# Patient Record
Sex: Male | Born: 1935 | Race: White | Hispanic: No | Smoking: Former smoker
Health system: Southern US, Community
[De-identification: ages and names within clinical notes are randomized; demographics above are authoritative.]

## PROBLEM LIST (undated history)

## (undated) DIAGNOSIS — M109 Gout, unspecified: Secondary | ICD-10-CM

## (undated) DIAGNOSIS — J939 Pneumothorax, unspecified: Secondary | ICD-10-CM

## (undated) DIAGNOSIS — E119 Type 2 diabetes mellitus without complications: Secondary | ICD-10-CM

## (undated) DIAGNOSIS — N189 Chronic kidney disease, unspecified: Secondary | ICD-10-CM

## (undated) DIAGNOSIS — I739 Peripheral vascular disease, unspecified: Secondary | ICD-10-CM

## (undated) DIAGNOSIS — I2510 Atherosclerotic heart disease of native coronary artery without angina pectoris: Secondary | ICD-10-CM

## (undated) DIAGNOSIS — E785 Hyperlipidemia, unspecified: Secondary | ICD-10-CM

## (undated) DIAGNOSIS — I509 Heart failure, unspecified: Secondary | ICD-10-CM

## (undated) DIAGNOSIS — I10 Essential (primary) hypertension: Secondary | ICD-10-CM

## (undated) DIAGNOSIS — K5790 Diverticulosis of intestine, part unspecified, without perforation or abscess without bleeding: Secondary | ICD-10-CM

## (undated) DIAGNOSIS — I219 Acute myocardial infarction, unspecified: Secondary | ICD-10-CM

## (undated) DIAGNOSIS — I4891 Unspecified atrial fibrillation: Secondary | ICD-10-CM

## (undated) DIAGNOSIS — K219 Gastro-esophageal reflux disease without esophagitis: Secondary | ICD-10-CM

## (undated) DIAGNOSIS — M1990 Unspecified osteoarthritis, unspecified site: Secondary | ICD-10-CM

## (undated) HISTORY — PX: HERNIA REPAIR: SHX51

## (undated) HISTORY — PX: FEMUR SURGERY: SHX943

## (undated) HISTORY — PX: CHOLECYSTECTOMY: SHX55

## (undated) HISTORY — PX: JOINT REPLACEMENT: SHX530

## (undated) HISTORY — PX: REPLACEMENT TOTAL KNEE: SUR1224

## (undated) HISTORY — PX: LUNG SURGERY: SHX703

## (undated) HISTORY — PX: CORONARY ARTERY BYPASS GRAFT: SHX141

## (undated) HISTORY — PX: GALLBLADDER SURGERY: SHX652

## (undated) HISTORY — PX: CARDIAC SURGERY: SHX584

## (undated) HISTORY — PX: OTHER SURGICAL HISTORY: SHX169

## (undated) HISTORY — PX: CARDIAC CATHETERIZATION: SHX172

---
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
## 1999-03-01 DIAGNOSIS — I219 Acute myocardial infarction, unspecified: Secondary | ICD-10-CM

## 1999-03-01 HISTORY — DX: Acute myocardial infarction, unspecified: I21.9

## 1999-11-22 ENCOUNTER — Encounter: Payer: Self-pay | Admitting: Thoracic Surgery (Cardiothoracic Vascular Surgery)

## 1999-11-22 ENCOUNTER — Inpatient Hospital Stay (HOSPITAL_COMMUNITY)
Admission: EM | Admit: 1999-11-22 | Discharge: 1999-11-29 | Payer: Self-pay | Admitting: Thoracic Surgery (Cardiothoracic Vascular Surgery)

## 1999-11-23 ENCOUNTER — Encounter: Payer: Self-pay | Admitting: Thoracic Surgery (Cardiothoracic Vascular Surgery)

## 1999-11-24 ENCOUNTER — Encounter: Payer: Self-pay | Admitting: Thoracic Surgery (Cardiothoracic Vascular Surgery)

## 1999-11-25 ENCOUNTER — Encounter: Payer: Self-pay | Admitting: Thoracic Surgery (Cardiothoracic Vascular Surgery)

## 2005-01-25 ENCOUNTER — Other Ambulatory Visit: Payer: Self-pay

## 2005-01-25 ENCOUNTER — Emergency Department: Payer: Self-pay | Admitting: Emergency Medicine

## 2005-07-12 ENCOUNTER — Other Ambulatory Visit: Payer: Self-pay

## 2005-07-12 ENCOUNTER — Inpatient Hospital Stay: Payer: Self-pay | Admitting: Surgery

## 2006-01-06 ENCOUNTER — Ambulatory Visit: Payer: Self-pay | Admitting: Family Medicine

## 2006-12-01 ENCOUNTER — Ambulatory Visit: Payer: Self-pay | Admitting: Family Medicine

## 2007-05-01 ENCOUNTER — Ambulatory Visit: Payer: Self-pay | Admitting: Gastroenterology

## 2007-05-02 ENCOUNTER — Ambulatory Visit: Payer: Self-pay | Admitting: Gastroenterology

## 2007-07-21 ENCOUNTER — Emergency Department: Payer: Self-pay | Admitting: Emergency Medicine

## 2007-08-07 ENCOUNTER — Ambulatory Visit: Payer: Self-pay | Admitting: Urology

## 2007-08-07 ENCOUNTER — Other Ambulatory Visit: Payer: Self-pay

## 2007-08-09 ENCOUNTER — Ambulatory Visit: Payer: Self-pay | Admitting: Urology

## 2007-08-16 ENCOUNTER — Ambulatory Visit: Payer: Self-pay | Admitting: Urology

## 2007-09-04 ENCOUNTER — Ambulatory Visit: Payer: Self-pay | Admitting: Urology

## 2007-10-18 ENCOUNTER — Ambulatory Visit: Payer: Self-pay | Admitting: Urology

## 2007-11-21 ENCOUNTER — Ambulatory Visit: Payer: Self-pay | Admitting: Urology

## 2008-03-10 ENCOUNTER — Ambulatory Visit: Payer: Self-pay | Admitting: Urology

## 2008-11-09 ENCOUNTER — Ambulatory Visit: Payer: Self-pay | Admitting: Family Medicine

## 2009-01-09 ENCOUNTER — Ambulatory Visit: Payer: Self-pay | Admitting: Surgery

## 2009-01-13 ENCOUNTER — Ambulatory Visit: Payer: Self-pay | Admitting: Surgery

## 2009-12-24 ENCOUNTER — Ambulatory Visit: Payer: Self-pay | Admitting: Internal Medicine

## 2010-07-14 ENCOUNTER — Ambulatory Visit: Payer: Self-pay | Admitting: Family Medicine

## 2010-09-07 ENCOUNTER — Ambulatory Visit: Payer: Self-pay | Admitting: Internal Medicine

## 2010-09-07 ENCOUNTER — Inpatient Hospital Stay: Payer: Self-pay | Admitting: Internal Medicine

## 2011-07-31 ENCOUNTER — Ambulatory Visit: Payer: Self-pay | Admitting: Medical

## 2012-12-18 DIAGNOSIS — N184 Chronic kidney disease, stage 4 (severe): Secondary | ICD-10-CM | POA: Insufficient documentation

## 2012-12-18 DIAGNOSIS — Z951 Presence of aortocoronary bypass graft: Secondary | ICD-10-CM | POA: Insufficient documentation

## 2012-12-18 DIAGNOSIS — E876 Hypokalemia: Secondary | ICD-10-CM | POA: Insufficient documentation

## 2012-12-18 DIAGNOSIS — Z5181 Encounter for therapeutic drug level monitoring: Secondary | ICD-10-CM | POA: Insufficient documentation

## 2012-12-18 DIAGNOSIS — Z96659 Presence of unspecified artificial knee joint: Secondary | ICD-10-CM | POA: Insufficient documentation

## 2012-12-18 DIAGNOSIS — I2510 Atherosclerotic heart disease of native coronary artery without angina pectoris: Secondary | ICD-10-CM | POA: Insufficient documentation

## 2012-12-21 ENCOUNTER — Encounter: Payer: Self-pay | Admitting: Internal Medicine

## 2012-12-23 ENCOUNTER — Ambulatory Visit: Payer: Self-pay | Admitting: Internal Medicine

## 2012-12-24 LAB — BASIC METABOLIC PANEL
Anion Gap: 7 (ref 7–16)
BUN: 36 mg/dL — ABNORMAL HIGH (ref 7–18)
Calcium, Total: 8.4 mg/dL — ABNORMAL LOW (ref 8.5–10.1)
Chloride: 106 mmol/L (ref 98–107)
Co2: 30 mmol/L (ref 21–32)
Creatinine: 1.81 mg/dL — ABNORMAL HIGH (ref 0.60–1.30)
EGFR (African American): 39 — ABNORMAL LOW
EGFR (Non-African Amer.): 34 — ABNORMAL LOW
Glucose: 91 mg/dL (ref 65–99)
Osmolality: 293 (ref 275–301)
Potassium: 3.8 mmol/L (ref 3.5–5.1)
Sodium: 143 mmol/L (ref 136–145)

## 2012-12-24 LAB — CBC WITH DIFFERENTIAL/PLATELET
Basophil #: 0 10*3/uL (ref 0.0–0.1)
Basophil %: 0.5 %
Eosinophil #: 0.5 10*3/uL (ref 0.0–0.7)
Eosinophil %: 5.7 %
HCT: 31.5 % — ABNORMAL LOW (ref 40.0–52.0)
HGB: 10.6 g/dL — ABNORMAL LOW (ref 13.0–18.0)
Lymphocyte #: 1.4 10*3/uL (ref 1.0–3.6)
Lymphocyte %: 18 %
MCH: 31.1 pg (ref 26.0–34.0)
MCHC: 33.7 g/dL (ref 32.0–36.0)
MCV: 92 fL (ref 80–100)
Monocyte #: 0.8 x10 3/mm (ref 0.2–1.0)
Monocyte %: 9.7 %
Neutrophil #: 5.3 10*3/uL (ref 1.4–6.5)
Neutrophil %: 66.1 %
Platelet: 240 10*3/uL (ref 150–440)
RBC: 3.42 10*6/uL — ABNORMAL LOW (ref 4.40–5.90)
RDW: 14.4 % (ref 11.5–14.5)
WBC: 8.1 10*3/uL (ref 3.8–10.6)

## 2012-12-25 ENCOUNTER — Ambulatory Visit: Payer: Self-pay | Admitting: Internal Medicine

## 2012-12-25 LAB — PROTIME-INR
INR: 1.7
Prothrombin Time: 19.5 secs — ABNORMAL HIGH (ref 11.5–14.7)

## 2012-12-27 LAB — PROTIME-INR
INR: 2.3
Prothrombin Time: 25.1 secs — ABNORMAL HIGH (ref 11.5–14.7)

## 2012-12-29 ENCOUNTER — Encounter: Payer: Self-pay | Admitting: Internal Medicine

## 2013-01-01 LAB — PROTIME-INR
INR: 3.9
Prothrombin Time: 36.6 secs — ABNORMAL HIGH (ref 11.5–14.7)

## 2013-01-08 LAB — PROTIME-INR
INR: 6.2
Prothrombin Time: 52 secs — ABNORMAL HIGH (ref 11.5–14.7)

## 2013-05-21 DIAGNOSIS — E782 Mixed hyperlipidemia: Secondary | ICD-10-CM | POA: Insufficient documentation

## 2013-07-19 DIAGNOSIS — I739 Peripheral vascular disease, unspecified: Secondary | ICD-10-CM | POA: Insufficient documentation

## 2013-07-24 DIAGNOSIS — R609 Edema, unspecified: Secondary | ICD-10-CM | POA: Insufficient documentation

## 2013-07-24 DIAGNOSIS — K219 Gastro-esophageal reflux disease without esophagitis: Secondary | ICD-10-CM | POA: Insufficient documentation

## 2013-07-24 DIAGNOSIS — M109 Gout, unspecified: Secondary | ICD-10-CM | POA: Insufficient documentation

## 2013-07-24 DIAGNOSIS — E1129 Type 2 diabetes mellitus with other diabetic kidney complication: Secondary | ICD-10-CM | POA: Insufficient documentation

## 2013-07-24 DIAGNOSIS — E119 Type 2 diabetes mellitus without complications: Secondary | ICD-10-CM | POA: Insufficient documentation

## 2013-12-12 ENCOUNTER — Inpatient Hospital Stay: Payer: Self-pay | Admitting: Internal Medicine

## 2013-12-12 LAB — COMPREHENSIVE METABOLIC PANEL
Albumin: 3.2 g/dL — ABNORMAL LOW (ref 3.4–5.0)
Alkaline Phosphatase: 69 U/L
Anion Gap: 8 (ref 7–16)
BUN: 36 mg/dL — ABNORMAL HIGH (ref 7–18)
Bilirubin,Total: 0.7 mg/dL (ref 0.2–1.0)
Calcium, Total: 8.3 mg/dL — ABNORMAL LOW (ref 8.5–10.1)
Chloride: 104 mmol/L (ref 98–107)
Co2: 30 mmol/L (ref 21–32)
Creatinine: 2.13 mg/dL — ABNORMAL HIGH (ref 0.60–1.30)
EGFR (African American): 38 — ABNORMAL LOW
EGFR (Non-African Amer.): 32 — ABNORMAL LOW
Glucose: 95 mg/dL (ref 65–99)
Osmolality: 291 (ref 275–301)
Potassium: 3.6 mmol/L (ref 3.5–5.1)
SGOT(AST): 22 U/L (ref 15–37)
SGPT (ALT): 28 U/L
Sodium: 142 mmol/L (ref 136–145)
Total Protein: 5.8 g/dL — ABNORMAL LOW (ref 6.4–8.2)

## 2013-12-12 LAB — URINALYSIS, COMPLETE
Bacteria: NONE SEEN
Bilirubin,UR: NEGATIVE
Blood: NEGATIVE
Glucose,UR: NEGATIVE mg/dL (ref 0–75)
Hyaline Cast: 5
Ketone: NEGATIVE
Nitrite: NEGATIVE
Ph: 5 (ref 4.5–8.0)
Protein: NEGATIVE
RBC,UR: 2 /HPF (ref 0–5)
Specific Gravity: 1.006 (ref 1.003–1.030)
Squamous Epithelial: NONE SEEN
WBC UR: 8 /HPF (ref 0–5)

## 2013-12-12 LAB — CBC
HCT: 35.3 % — ABNORMAL LOW (ref 40.0–52.0)
HGB: 11.3 g/dL — ABNORMAL LOW (ref 13.0–18.0)
MCH: 30.2 pg (ref 26.0–34.0)
MCHC: 32.1 g/dL (ref 32.0–36.0)
MCV: 94 fL (ref 80–100)
Platelet: 186 10*3/uL (ref 150–440)
RBC: 3.76 10*6/uL — ABNORMAL LOW (ref 4.40–5.90)
RDW: 15.1 % — ABNORMAL HIGH (ref 11.5–14.5)
WBC: 8.6 10*3/uL (ref 3.8–10.6)

## 2013-12-12 LAB — CK TOTAL AND CKMB (NOT AT ARMC)
CK, Total: 161 U/L
CK-MB: 1.7 ng/mL (ref 0.5–3.6)

## 2013-12-12 LAB — PROTIME-INR
INR: 2
Prothrombin Time: 22.5 secs — ABNORMAL HIGH (ref 11.5–14.7)

## 2013-12-12 LAB — APTT: Activated PTT: 28.7 secs (ref 23.6–35.9)

## 2013-12-12 LAB — TROPONIN I: Troponin-I: <0.02 ng/mL

## 2013-12-13 LAB — CBC WITH DIFFERENTIAL/PLATELET
Basophil #: 0 10*3/uL (ref 0.0–0.1)
Basophil %: 0.5 %
Eosinophil #: 0.1 10*3/uL (ref 0.0–0.7)
Eosinophil %: 1.3 %
HCT: 29.2 % — ABNORMAL LOW (ref 40.0–52.0)
HGB: 9.6 g/dL — ABNORMAL LOW (ref 13.0–18.0)
Lymphocyte #: 1.1 10*3/uL (ref 1.0–3.6)
Lymphocyte %: 14.5 %
MCH: 30.9 pg (ref 26.0–34.0)
MCHC: 33 g/dL (ref 32.0–36.0)
MCV: 93 fL (ref 80–100)
Monocyte #: 0.7 x10 3/mm (ref 0.2–1.0)
Monocyte %: 8.3 %
Neutrophil #: 5.9 10*3/uL (ref 1.4–6.5)
Neutrophil %: 75.4 %
Platelet: 176 10*3/uL (ref 150–440)
RBC: 3.12 10*6/uL — ABNORMAL LOW (ref 4.40–5.90)
RDW: 15 % — ABNORMAL HIGH (ref 11.5–14.5)
WBC: 7.9 10*3/uL (ref 3.8–10.6)

## 2013-12-13 LAB — BASIC METABOLIC PANEL
Anion Gap: 7 (ref 7–16)
BUN: 37 mg/dL — ABNORMAL HIGH (ref 7–18)
Calcium, Total: 7.7 mg/dL — ABNORMAL LOW (ref 8.5–10.1)
Chloride: 105 mmol/L (ref 98–107)
Co2: 30 mmol/L (ref 21–32)
Creatinine: 2.31 mg/dL — ABNORMAL HIGH (ref 0.60–1.30)
EGFR (African American): 35 — ABNORMAL LOW
EGFR (Non-African Amer.): 29 — ABNORMAL LOW
Glucose: 126 mg/dL — ABNORMAL HIGH (ref 65–99)
Osmolality: 293 (ref 275–301)
Potassium: 4.1 mmol/L (ref 3.5–5.1)
Sodium: 142 mmol/L (ref 136–145)

## 2013-12-13 LAB — PROTIME-INR
INR: 1.8
INR: 1.6
Prothrombin Time: 20.8 secs — ABNORMAL HIGH (ref 11.5–14.7)
Prothrombin Time: 19 secs — ABNORMAL HIGH (ref 11.5–14.7)

## 2013-12-14 LAB — CBC WITH DIFFERENTIAL/PLATELET
Basophil #: 0 10*3/uL (ref 0.0–0.1)
Basophil %: 0.5 %
Eosinophil #: 0.2 10*3/uL (ref 0.0–0.7)
Eosinophil %: 2 %
HCT: 28.2 % — ABNORMAL LOW (ref 40.0–52.0)
HGB: 9.2 g/dL — ABNORMAL LOW (ref 13.0–18.0)
Lymphocyte #: 1 10*3/uL (ref 1.0–3.6)
Lymphocyte %: 12.4 %
MCH: 30.8 pg (ref 26.0–34.0)
MCHC: 32.5 g/dL (ref 32.0–36.0)
MCV: 95 fL (ref 80–100)
Monocyte #: 0.9 x10 3/mm (ref 0.2–1.0)
Monocyte %: 11.1 %
Neutrophil #: 5.9 10*3/uL (ref 1.4–6.5)
Neutrophil %: 74 %
Platelet: 152 10*3/uL (ref 150–440)
RBC: 2.98 10*6/uL — ABNORMAL LOW (ref 4.40–5.90)
RDW: 15.1 % — ABNORMAL HIGH (ref 11.5–14.5)
WBC: 7.9 10*3/uL (ref 3.8–10.6)

## 2013-12-14 LAB — PROTIME-INR
INR: 1.3
Prothrombin Time: 15.5 secs — ABNORMAL HIGH (ref 11.5–14.7)

## 2013-12-14 LAB — BASIC METABOLIC PANEL
Anion Gap: 8 (ref 7–16)
BUN: 37 mg/dL — ABNORMAL HIGH (ref 7–18)
Calcium, Total: 8 mg/dL — ABNORMAL LOW (ref 8.5–10.1)
Chloride: 108 mmol/L — ABNORMAL HIGH (ref 98–107)
Co2: 27 mmol/L (ref 21–32)
Creatinine: 2.13 mg/dL — ABNORMAL HIGH (ref 0.60–1.30)
EGFR (African American): 38 — ABNORMAL LOW
EGFR (Non-African Amer.): 32 — ABNORMAL LOW
Glucose: 108 mg/dL — ABNORMAL HIGH (ref 65–99)
Osmolality: 294 (ref 275–301)
Potassium: 4 mmol/L (ref 3.5–5.1)
Sodium: 143 mmol/L (ref 136–145)

## 2013-12-15 LAB — CBC WITH DIFFERENTIAL/PLATELET
Basophil #: 0 10*3/uL (ref 0.0–0.1)
Basophil %: 0.4 %
Eosinophil #: 0.1 10*3/uL (ref 0.0–0.7)
Eosinophil %: 0.8 %
HCT: 25.1 % — ABNORMAL LOW (ref 40.0–52.0)
HGB: 8 g/dL — ABNORMAL LOW (ref 13.0–18.0)
Lymphocyte #: 0.8 10*3/uL — ABNORMAL LOW (ref 1.0–3.6)
Lymphocyte %: 11.7 %
MCH: 30.3 pg (ref 26.0–34.0)
MCHC: 31.7 g/dL — ABNORMAL LOW (ref 32.0–36.0)
MCV: 96 fL (ref 80–100)
Monocyte #: 0.7 x10 3/mm (ref 0.2–1.0)
Monocyte %: 10.7 %
Neutrophil #: 5.2 10*3/uL (ref 1.4–6.5)
Neutrophil %: 76.4 %
Platelet: 133 10*3/uL — ABNORMAL LOW (ref 150–440)
RBC: 2.63 10*6/uL — ABNORMAL LOW (ref 4.40–5.90)
RDW: 14.8 % — ABNORMAL HIGH (ref 11.5–14.5)
WBC: 6.8 10*3/uL (ref 3.8–10.6)

## 2013-12-15 LAB — BASIC METABOLIC PANEL
Anion Gap: 7 (ref 7–16)
BUN: 34 mg/dL — ABNORMAL HIGH (ref 7–18)
Calcium, Total: 7.7 mg/dL — ABNORMAL LOW (ref 8.5–10.1)
Chloride: 107 mmol/L (ref 98–107)
Co2: 29 mmol/L (ref 21–32)
Creatinine: 2.03 mg/dL — ABNORMAL HIGH (ref 0.60–1.30)
EGFR (African American): 40 — ABNORMAL LOW
EGFR (Non-African Amer.): 33 — ABNORMAL LOW
Glucose: 133 mg/dL — ABNORMAL HIGH (ref 65–99)
Osmolality: 295 (ref 275–301)
Potassium: 4.1 mmol/L (ref 3.5–5.1)
Sodium: 143 mmol/L (ref 136–145)

## 2013-12-15 LAB — PROTIME-INR
INR: 1.3
Prothrombin Time: 15.9 secs — ABNORMAL HIGH (ref 11.5–14.7)

## 2013-12-16 LAB — BASIC METABOLIC PANEL
Anion Gap: 8 (ref 7–16)
BUN: 40 mg/dL — ABNORMAL HIGH (ref 7–18)
Calcium, Total: 8 mg/dL — ABNORMAL LOW (ref 8.5–10.1)
Chloride: 106 mmol/L (ref 98–107)
Co2: 28 mmol/L (ref 21–32)
Creatinine: 2.31 mg/dL — ABNORMAL HIGH (ref 0.60–1.30)
EGFR (African American): 35 — ABNORMAL LOW
EGFR (Non-African Amer.): 29 — ABNORMAL LOW
Glucose: 120 mg/dL — ABNORMAL HIGH (ref 65–99)
Osmolality: 294 (ref 275–301)
Potassium: 4 mmol/L (ref 3.5–5.1)
Sodium: 142 mmol/L (ref 136–145)

## 2013-12-16 LAB — PROTIME-INR
INR: 1.5
Prothrombin Time: 17.5 secs — ABNORMAL HIGH (ref 11.5–14.7)

## 2013-12-16 LAB — HEMOGLOBIN: HGB: 8.9 g/dL — ABNORMAL LOW (ref 13.0–18.0)

## 2013-12-17 LAB — BASIC METABOLIC PANEL
Anion Gap: 8 (ref 7–16)
BUN: 58 mg/dL — ABNORMAL HIGH (ref 7–18)
Calcium, Total: 8.1 mg/dL — ABNORMAL LOW (ref 8.5–10.1)
Chloride: 104 mmol/L (ref 98–107)
Co2: 29 mmol/L (ref 21–32)
Creatinine: 2.49 mg/dL — ABNORMAL HIGH (ref 0.60–1.30)
EGFR (African American): 32 — ABNORMAL LOW
EGFR (Non-African Amer.): 26 — ABNORMAL LOW
Glucose: 112 mg/dL — ABNORMAL HIGH (ref 65–99)
Osmolality: 298 (ref 275–301)
Potassium: 4 mmol/L (ref 3.5–5.1)
Sodium: 141 mmol/L (ref 136–145)

## 2013-12-17 LAB — HEMOGLOBIN: HGB: 8.7 g/dL — ABNORMAL LOW (ref 13.0–18.0)

## 2013-12-17 LAB — PROTIME-INR
INR: 1.8
Prothrombin Time: 20.3 secs — ABNORMAL HIGH (ref 11.5–14.7)

## 2013-12-18 ENCOUNTER — Encounter: Payer: Self-pay | Admitting: Internal Medicine

## 2013-12-19 LAB — PROTIME-INR
INR: 2.2
Prothrombin Time: 23.5 secs — ABNORMAL HIGH (ref 11.5–14.7)

## 2013-12-24 LAB — PROTIME-INR
INR: 2.8
Prothrombin Time: 28.4 secs — ABNORMAL HIGH (ref 11.5–14.7)

## 2013-12-24 LAB — BASIC METABOLIC PANEL
Anion Gap: 8 (ref 7–16)
BUN: 49 mg/dL — ABNORMAL HIGH (ref 7–18)
Calcium, Total: 8.4 mg/dL — ABNORMAL LOW (ref 8.5–10.1)
Chloride: 106 mmol/L (ref 98–107)
Co2: 31 mmol/L (ref 21–32)
Creatinine: 1.79 mg/dL — ABNORMAL HIGH (ref 0.60–1.30)
EGFR (African American): 47 — ABNORMAL LOW
EGFR (Non-African Amer.): 39 — ABNORMAL LOW
Glucose: 82 mg/dL (ref 65–99)
Osmolality: 301 (ref 275–301)
Potassium: 4.4 mmol/L (ref 3.5–5.1)
Sodium: 145 mmol/L (ref 136–145)

## 2013-12-24 LAB — CBC WITH DIFFERENTIAL/PLATELET
Basophil #: 0.1 10*3/uL (ref 0.0–0.1)
Basophil %: 0.7 %
Eosinophil #: 0.6 10*3/uL (ref 0.0–0.7)
Eosinophil %: 6.8 %
HCT: 28.8 % — ABNORMAL LOW (ref 40.0–52.0)
HGB: 9.3 g/dL — ABNORMAL LOW (ref 13.0–18.0)
Lymphocyte #: 1.6 10*3/uL (ref 1.0–3.6)
Lymphocyte %: 19.1 %
MCH: 30 pg (ref 26.0–34.0)
MCHC: 32.2 g/dL (ref 32.0–36.0)
MCV: 93 fL (ref 80–100)
Monocyte #: 0.6 x10 3/mm (ref 0.2–1.0)
Monocyte %: 7.2 %
Neutrophil #: 5.4 10*3/uL (ref 1.4–6.5)
Neutrophil %: 66.2 %
Platelet: 368 10*3/uL (ref 150–440)
RBC: 3.09 10*6/uL — ABNORMAL LOW (ref 4.40–5.90)
RDW: 15.1 % — ABNORMAL HIGH (ref 11.5–14.5)
WBC: 8.2 10*3/uL (ref 3.8–10.6)

## 2013-12-29 ENCOUNTER — Encounter: Payer: Self-pay | Admitting: Internal Medicine

## 2013-12-31 LAB — PROTIME-INR
INR: 4.1
Prothrombin Time: 38.4 secs — ABNORMAL HIGH (ref 11.5–14.7)

## 2014-01-02 LAB — PROTIME-INR
INR: 3.1
Prothrombin Time: 31.1 secs — ABNORMAL HIGH (ref 11.5–14.7)

## 2014-01-07 LAB — PROTIME-INR
INR: 3.1
Prothrombin Time: 31.4 secs — ABNORMAL HIGH (ref 11.5–14.7)

## 2014-01-14 LAB — PROTIME-INR
INR: 4.4
Prothrombin Time: 40.7 secs — ABNORMAL HIGH (ref 11.5–14.7)

## 2014-01-16 LAB — PROTIME-INR
INR: 3.6
Prothrombin Time: 34.5 secs — ABNORMAL HIGH (ref 11.5–14.7)

## 2014-01-28 ENCOUNTER — Encounter: Payer: Self-pay | Admitting: Internal Medicine

## 2014-02-03 ENCOUNTER — Ambulatory Visit: Payer: Self-pay | Admitting: Orthopedic Surgery

## 2014-02-12 DIAGNOSIS — I480 Paroxysmal atrial fibrillation: Secondary | ICD-10-CM | POA: Insufficient documentation

## 2014-02-12 DIAGNOSIS — I5022 Chronic systolic (congestive) heart failure: Secondary | ICD-10-CM | POA: Insufficient documentation

## 2014-02-28 ENCOUNTER — Encounter: Payer: Self-pay | Admitting: Internal Medicine

## 2014-06-21 NOTE — Discharge Summary (Signed)
PATIENT NAME:  Carlos [name], Carlos [name] MR#:  [id number] DATE OF BIRTH:  02-22-1927  DATE OF ADMISSION:  12/12/2013 DATE OF DISCHARGE:  12/17/2013  FINAL DIAGNOSES: 1.  Right hip fracture, closed, requiring operative repair.  2.  Acute renal failure, on chronic kidney disease stage III.  3.  Essential hypertension.  4.  Postoperative anemia.  5.  Atrial fibrillation.  6.  Gout.  7.  Hyperlipidemia.   MEDICATIONS ON DISCHARGE: Include atenolol 25 mg daily, omeprazole 20 mg daily, oxycodone 5 mg every 6 hours as needed for pain, multivitamin 1 tablet daily, ferrous sulfate 325 mg daily, Coumadin 5 mg once a day at 5:00 p.m., Colcrys 0.6 mg as needed for gout attack, simvastatin 40 mg at bedtime, allopurinol 100 mg daily, Colace 240 mg at bedtime.   Stop taking aspirin, torsemide, tramadol, Excedrin p.m. and the Levaquin. Can restart torsemide 20 mg p.o. daily on 12/20/2013.   DISCHARGE RECOMMENDATIONS: Recommend checking a PT and INR every Thursday and adjusting Coumadin. Recommend checking a BMP on Thursday.   DISCHARGE DIET: Low sodium, regular consistency.   DISCHARGE ACTIVITY: As tolerated with physical therapy.   DISCHARGE INSTRUCTIONS: Follow up with orthopedics as per orthopedic discharge. Follow up in 1 to 2 days with doctor at rehab. Dressing change as per orthopedic discharge instructions.   HISTORY OF PRESENT ILLNESS: The patient came in October 15th with a fall and found to have a right hip fracture. Since the patient was on Coumadin he was given a dose of vitamin K.   LABORATORY AND RADIOLOGICAL DATA DURING THE HOSPITAL COURSE: INR upon presentation 2. Troponin negative. White blood cell count 8.6, hemoglobin and hematocrit 11.3 and 35.3, and platelet count 186,000. Glucose 95, BUN 36, creatinine 2.13, sodium 142, potassium 3.6, chloride 104, CO2 30, calcium 8.3. Liver function tests normal range. Albumin low at 3.2.   EKG: Sinus rhythm, first-degree AV block, premature atrial complexes,  nonspecific ST-T wave changes.   Leukocyte esterase was trace on the urine.  Right hip showed acute right intertrochanteric fracture.   Chest x-ray: No acute cardiopulmonary abnormality seen.   INR on the 17th was down to 1.3.   Right hip x-ray after surgery showed status post ORIF of proximal right femur fracture.   Creatinine on the 18th was as low at 2.03. Hemoglobin on the 18th was 8. Last hemoglobin was 8.9. Creatinine upon discharge 2.49. INR 1.8.   HOSPITAL COURSE PER PROBLEM LIST:  1.  For the patient's right hip fracture, closed, requiring operative repair, this was done by Dr. Rudene Christians on December 14, 2013. We had to wait for the INR to come down to an appropriate range prior to doing the surgery. The patient here postoperatively 3 days has done well, stable to transfer out to rehab. The patient has not done too much with physical therapy. The patient lives alone, will have to be pretty independent before he goes home.  2.  Acute renal failure, on chronic kidney disease stage III. Creatinine creeped up a little bit on the day of discharge. I did hold the patient's torsemide starting on the 19th and am giving a fluid bolus. I do recommend checking a BMP on Thursday. Creatinine ranged between 2.49 and 2.03 here. Looking back in the past, a couple of years ago, he was 1.8, so the patient probably has chronic kidney disease with creatinine ranging in the lower 2 range. Hold the patient's torsemide until BMP. Can restart a lower dose, 20 mg, on 12/20/2013.  3.  For the patient's essential hypertension, I will hold the patient's torsemide, blood pressure on the lower side.  4.  Postoperative anemia. The patient did receive 1 unit of packed red blood cells during the hospital course. Iron should handle this as outpatient.  5.  Atrial fibrillation, on Coumadin. INR needed to be reversed with vitamin K. Now up to 1.8, close enough. Continue 5 mg of Coumadin. I recommend checking an INR every Thursday  and adjustment of Coumadin at rehab.  6.  Gout. On renally dosed allopurinol. P.r.n. Colcrys.   TIME SPENT ON DISCHARGE: 40 minutes.   ____________________________ Tana Conch. Leslye Peer, MD rjw:[id number] D: 12/17/2013 09:43:59 ET T: 12/17/2013 10:16:55 ET JOB#: [id number]  cc: Tana Conch. Leslye Peer, MD, <Dictator> Marisue Brooklyn MD ELECTRONICALLY SIGNED 12/19/2013 13:23

## 2014-06-21 NOTE — Op Note (Signed)
PATIENT NAME:  Carlos [name], Carlos [name] MR#:  [id number] DATE OF BIRTH:  1927/02/13  DATE OF PROCEDURE:  12/14/2013  PREOPERATIVE DIAGNOSIS: Comminuted right intertrochanteric hip fracture.   POSTOPERATIVE DIAGNOSIS: Comminuted right intertrochanteric hip fracture.   PROCEDURE: Open reduction internal fixation, intertrochanteric hip fracture, right.   ANESTHESIA: Spinal.   SURGEON: Hessie Knows, MD.   DESCRIPTION OF PROCEDURE: The patient was brought to the operating room and after adequate spinal anesthesia was obtained, the patient was placed on the fracture table with the left leg in the well leg holder. The right leg was placed in the traction boot and traction applied. C-arm was brought in and good visualization in both AP and lateral projections obtained. Additional traction was applied and on the lateral view, there was posterior sag, so a bolster was placed underneath the proximal thigh on the right side to correct this deformity. It was then prepped and draped using the barrier drape method. Patient identification and timeout procedures were completed.   A lateral incision was made at the level of the greater trochanter fracture at its spike. The skin and subcutaneous tissue was incised. The IT band was split and the vastus lateralis elevated off the intermuscular septum. A reduction clamp from the [employer] set was used to reduced the distal tip of the greater trochanter as well as the lesser trochanter. With the fracture now reduced through open methods, a more proximal incision was made and a guidewire inserted through the tip of the trochanter down the canal. Proximal reaming was carried out, followed by placement of the long guidewire. Reaming was carried out to 13 mm and an 11 x 380 mm rod was obtained after measuring using the ruler from the set.   The rod was inserted to the appropriate depth. A second lateral incision was made distal to the first for the lag screw. The subcutaneous tissue was  spread. The guidewire inserted through a center/center position. A second guidewire was attempted to be placed, but it would not go through rod and through the blue sleeve and was left off. After measuring, the reamer was set to 110 mm. The proximal femur was drilled, tapped and then the 10.5 by 110 mm lag  was inserted. The proximal set screw was placed with quarter turn loosening to allow for compression. At this point, traction was released from the leg and the compression screw was utilized to get better compression at the fracture site and better reduction.   Once this was completed, the reduction clamp was removed, along with the posterior support. The greater trochanter fracture was unstable and it was felt it should be cabled to prevent loss of abduction strength. With the proximal femur adequately exposed to pass the guidewire, a single 1.7 mm guidewire was taken from the Synthe[employer] set and passed with crimp and then tightened at the appropriate level, holding down this distal spike of the greater trochanter fracture. With proximal femur fixation completed, attention was turned distally. With perfect circle technique, the distal screw hole was filled using a 5.0 x 50 mm screw, first making a small stab incision, drilling, measuring and then placing the screw.   Permanent AP view of that was obtained as well as proximal AP and lateral imaging. The wounds were then thoroughly irrigated. The IT band and deep fascia were repaired using #1 Vicryl, 2-0 Vicryl subcutaneously and skin staples. Xeroform, 4 x 4s, ABD and tape applied. The patient was sent to the recovery room in stable condition.   ESTIMATED  BLOOD LOSS: 500.   COMPLICATIONS: None.   SPECIMEN: None.   IMPLANTS: Right Affixus hip fracture nail, 11 x 380 mm with a 10.5 x 110 mm lag screw, 5.0 mm distal cortical screw and a single cable sleeve from [employer]ynthe[employer].     ____________________________ Laurene Footman, MD mjm:TT D: 12/14/2013 17:24:16  ET T: 12/14/2013 18:01:46 ET JOB#: [id number]  cc: Laurene Footman, MD, <Dictator> Laurene Footman MD ELECTRONICALLY SIGNED 12/14/2013 21:50

## 2014-06-21 NOTE — Consult Note (Signed)
PATIENT NAME:  Carlos [name], Carlos [name] MR#:  [id number] DATE OF BIRTH:  06/16/1926  DATE OF CONSULTATION:  12/12/2013  REFERRING PHYSICIAN:  Abel Presto, MD (Hospitalist)  CONSULTING PHYSICIAN:  Laurice Record. Holley Bouche., MD  CHIEF COMPLAINT: Right hip pain.   HISTORY OF PRESENT ILLNESS: The patient is an 81 year old male who fell while cleaning windows outside when he tripped and fell, landing on his right side. He is unable to stand or bear weight due to the right hip pain. He presented to [facility] Emergency Room, at which time x-rays demonstrated a right intertrochanteric femur fracture. He denied any other injuries. He did not have any loss of consciousness.   PAST MEDICAL HISTORY: Coronary artery disease status post coronary bypass surgery, hypertension, hyperlipidemia, gout, chronic renal disease stage III, status post right total knee arthroplasty.   MEDICATIONS: At time of admission allopurinol 300 mg daily, aspirin 81 mg daily, atenolol 25 mg daily, colchicine 0.6 mg daily p.r.n., Excedrin PM at bedtime as needed, iron sulfate 325 mg daily, multivitamin daily, omeprazole 20 mg daily, oxycodone 5 mg q. 6 hours p.r.n., simvastatin 40 mg daily, torsemide 20 mg twice a day, tramadol 50 mg q. 4 hours p.r.n., warfarin 5 mg daily.   ALLERGIES: MORPHINE.   SOCIAL HISTORY: Previous history of smoking a pipe. The patient drinks wine on a regular basis. No illicit drug use. He lives at home by himself. He has children which live out of town.    FAMILY HISTORY: Positive for lung cancer in mother and brother.   PERTINENT REVIEW OF SYSTEMS: No fevers, chills, weight gain, weight loss. No vision or hearing changes. The patient denies any shortness of breath, cough or wheeze. No chest pain or palpitations. No GI upset or nausea. Musculoskeletal symptoms are positive for gout, as well as right hip pain.   PHYSICAL EXAMINATION:  GENERAL: The patient is a pleasant, well-developed, well-nourished male seen in no acute  distress, lying supine in the hospital bed.  HEENT: Atraumatic, normocephalic. Sclerae are clear. Extraocular motion is intact.  OROPHARYNX: Clear with moist mucosa.  NECK: Supple, nontender, with good range of motion.  CARDIAC: Regular rate and rhythm with normal S1, S2. No appreciable murmurs, gallops, or rubs.  LUNGS: Clear to auscultation bilaterally.  ABDOMEN: Soft, nontender, nondistended.  MUSCULOSKELETAL: Good strength and stability of the upper extremities. Pertinent examination of the lower extremities is that of the right lower extremity. The right lower extremity is shortened and rotated. Some mild swelling is noted to the thigh. There is tenderness to palpation about the proximal femur. No gross bruising is appreciated. No knee effusion. No tenderness or swelling to the right foot or ankle.  NEUROLOGIC: Awake, alert, and oriented. Sensory function is grossly intact in all 4 extremities. Motor strength is felt to be 5/5 with the exception of the right lower extremity which was not assessed secondary to injury. Good fine motor coordination. No clonus or tremor.   X-RAYS: Radiographs of the right hip demonstrate a displaced right intertrochanteric femur fracture. Relative osteopenia is noted.   IMPRESSION: Right intertrochanteric femur fracture.   PLAN: Findings were discussed in detail with the patient. Recommendation was made for open reduction internal fixation. Risks and benefits of surgical intervention were discussed. The usual perioperative course was also discussed. The patient expressed understanding of the risks, benefits, and agreed with plans for surgical intervention. The patient's current INR is 2.0. We ideally need to achieve an INR of 1.3 for surgery. The patient has apparently received vitamin  K as per internal medicine. ProTime is scheduled to be repeated in the morning. Possibility of surgery tomorrow was discussed with the patient. He will be made n.p.o. after midnight in  anticipation of surgery.     ____________________________ Laurice Record. Holley Bouche., MD jph:JT D: 12/12/2013 22:34:54 ET T: 12/12/2013 23:15:41 ET JOB#: [id number]  cc: Laurice Record. Holley Bouche., MD, <Dictator> Laurice Record Holley Bouche MD ELECTRONICALLY SIGNED 12/19/2013 19:51

## 2014-06-21 NOTE — H&P (Signed)
PATIENT NAME:  Carlos [name], BEICHLER MR#:  [id number] DATE OF BIRTH:  11/03/1926  DATE OF ADMISSION:  12/12/2013  PRIMARY CARE PHYSICIAN: Leafy Half, MD  CHIEF COMPLAINT: Status post fall and right hip fracture.   HISTORY OF PRESENT ILLNESS: This is an 81 year old male who presented to the Emergency Room after having a fall earlier today and having some right hip pain. The patient says that he was cleaning his windows from the outside when he tripped and while he was falling he felt a little crack on his right side. Luckily he had his phone with him and he called 911 and he was brought to the ER. His x-ray of the right hip revealed an intertrochanteric fracture. Hospitalist services were contacted for further treatment and evaluation. The patient denies any prodromal symptoms prior to his fall like any dizziness, chest pain, shortness of breath, nausea, vomiting, or any other associated symptoms.   REVIEW OF SYSTEMS: CONSTITUTIONAL: No documented fever. No weight gain or weight loss.  EYES: No blurred or double vision.  ENT: No tinnitus. No postnasal drip. No redness of the oropharynx.  RESPIRATORY: No cough, no wheeze, no hemoptysis, no dyspnea.  CARDIOVASCULAR: No chest pain, no orthopnea, no palpitations, no syncope.  GASTROINTESTINAL: No nausea, no vomiting, no diarrhea, no abdominal pain, no melena or hematochezia. GENITOURINARY: No dysuria or hematuria.  ENDOCRINE: No polyuria or nocturia. No heat or intolerance.  HEMATOLOGIC: No anemia, no bruising, no bleeding.  INTEGUMENTARY: No rashes, no lesions.  MUSCULOSKELETAL: No arthritis, no swelling. Positive gout.  NEUROLOGIC: No numbness, no tingling, no ataxia, no seizure type activity.  PSYCHIATRIC: No anxiety, no insomnia, no ADD.   PAST MEDICAL HISTORY: Consistent with coronary artery disease status post bypass, hypertension, hyperlipidemia, gout, chronic kidney disease stage III.   ALLERGIES: MORPHINE.   SOCIAL HISTORY: Used to smoke  a pipe, quit about 15 years ago. Does have a 40 pack-year pipe smoking history. Still drinks wine on a regular basis. No illicit drug abuse. Lives at home by himself.   FAMILY HISTORY: Father died from complications of old age. Mother died from lung cancer. He also had a brother who died from lung cancer.   CURRENT MEDICATIONS: Allopurinol 300 mg daily, aspirin 81 mg daily, atenolol 25 mg daily, colchicine 0.6 mg daily as needed, Excedrin PM at bedtime as needed, iron sulfate 325 mg daily, multivitamin daily, omeprazole 20 mg daily, oxycodone 5 mg q. 6 hours as needed, simvastatin 40 mg at bedtime, torsemide 20 mg twice a day, tramadol 50 mg q. 4 hours as needed and warfarin 5 mg daily.   PHYSICAL EXAMINATION: Presently is as follows:  VITAL SIGNS: Temperature 97.7, pulse 77, respirations 20, blood pressure 142/65, sats 100% on room air.  GENERAL: He is a pleasant-appearing male, in no apparent distress.  HEAD, EYES, EARS, NOSE AND THROAT: Atraumatic, normocephalic. Extraocular muscles are intact. Pupils equal and reactive to light. Sclerae anicteric. No conjunctival injection. No pharyngeal erythema.  NECK: Supple. There is no jugular venous distention. No bruits, no lymphadenopathy, no thyromegaly.  HEART: Regular rate and rhythm. No murmurs, no rubs, no clicks.  LUNGS: Clear to auscultation bilaterally. No rales, no rhonchi, no wheezes.  ABDOMEN: Soft, flat, nontender, nondistended. Has good bowel sounds. No hepatosplenomegaly appreciated.  EXTREMITIES: No evidence of any cyanosis, clubbing, or peripheral edema. Has +2 pedal and radial pulses bilaterally. His right lower extremity is shortened and externally rotated due to the fracture.  NEUROLOGIC: The patient is alert, awake and oriented x3  with no focal motor or sensory deficits appreciated bilaterally.  SKIN: Moist and warm with no rashes.  LYMPHATIC: There is no cervical or axillary lymphadenopathy.   DIAGNOSTIC DATA: Serum glucose 95, BUN  36, creatinine 2.13, sodium 142, potassium 3.6, chloride 104, bicarb 30, albumin 3.2. Troponin less than 0.02. LFTs within normal limits. White cell count 8.6, hemoglobin 11.3, hematocrit 35.3, platelet count 186,000. INR 2.   The patient did have a chest x-ray done which showed no acute cardiopulmonary disease.   The patient also had an x-ray of the right hip which showed acute right intertrochanteric fracture.   ASSESSMENT AND PLAN: An 81 year old male with a history of coronary artery disease status post bypass, hypertension, hyperlipidemia, gout, and chronic kidney disease stage III who  presented to the hospital after a fall and noted to have a right hip fracture.  1.  Preoperative evaluation. The patient is likely a low to moderate risk for noncardiac surgery. There is no absolute contraindication to surgery at this time. The patient's EKG has been reviewed, showed no acute ST or T wave changes. I would continue perioperative beta blocker with atenolol. I will give 1 dose of vitamin K to reverse his Coumadin associated coagulopathy.  2.  Right hip fracture. This is secondary to a mechanical fall. I will consult orthopedics. The patient likely will need hip replacement.  3.  Hypertension. Continue atenolol.  4.  Hyperlipidemia. Continue simvastatin. 5.  Acute on chronic renal failure. I will give the patient gentle IV fluids, follow BUN and creatinine. Baseline creatinine is around 1.8, currently elevated at 2.3.  6.  Gout. No acute attack. Continue with the colchicine and allopurinol.   CODE STATUS: The patient is a FULL code.   TIME SPENT: 50 minutes.  ____________________________ Belia Heman. Verdell Carmine, MD vjs:sb D: 12/12/2013 16:18:42 ET T: 12/12/2013 16:43:33 ET JOB#: [id number]  cc: Belia Heman. Verdell Carmine, MD, <Dictator> Henreitta Leber MD ELECTRONICALLY SIGNED 01/06/2014 11:00

## 2014-07-29 ENCOUNTER — Other Ambulatory Visit: Payer: Self-pay | Admitting: Urology

## 2014-07-29 DIAGNOSIS — R319 Hematuria, unspecified: Secondary | ICD-10-CM

## 2014-07-30 ENCOUNTER — Ambulatory Visit
Admission: RE | Admit: 2014-07-30 | Discharge: 2014-07-30 | Disposition: A | Discharge status: Home / Self Care | Payer: Medicare PPO | Source: Ambulatory Visit | Attending: Urology | Admitting: Urology

## 2014-07-30 ENCOUNTER — Ambulatory Visit (INDEPENDENT_AMBULATORY_CARE_PROVIDER_SITE_OTHER): Payer: Medicare PPO | Admitting: Urology

## 2014-07-30 ENCOUNTER — Encounter: Payer: Self-pay | Admitting: Urology

## 2014-07-30 VITALS — BP 116/63 | HR 62 | Ht 67.0 in | Wt 209.1 lb

## 2014-07-30 DIAGNOSIS — I10 Essential (primary) hypertension: Secondary | ICD-10-CM | POA: Insufficient documentation

## 2014-07-30 DIAGNOSIS — N3000 Acute cystitis without hematuria: Secondary | ICD-10-CM

## 2014-07-30 DIAGNOSIS — Z87442 Personal history of urinary calculi: Secondary | ICD-10-CM | POA: Insufficient documentation

## 2014-07-30 DIAGNOSIS — N200 Calculus of kidney: Secondary | ICD-10-CM | POA: Diagnosis not present

## 2014-07-30 DIAGNOSIS — E785 Hyperlipidemia, unspecified: Secondary | ICD-10-CM | POA: Insufficient documentation

## 2014-07-30 DIAGNOSIS — K5790 Diverticulosis of intestine, part unspecified, without perforation or abscess without bleeding: Secondary | ICD-10-CM | POA: Insufficient documentation

## 2014-07-30 DIAGNOSIS — N289 Disorder of kidney and ureter, unspecified: Secondary | ICD-10-CM | POA: Diagnosis not present

## 2014-07-30 DIAGNOSIS — R350 Frequency of micturition: Secondary | ICD-10-CM | POA: Diagnosis not present

## 2014-07-30 DIAGNOSIS — A150 Tuberculosis of lung: Secondary | ICD-10-CM | POA: Diagnosis not present

## 2014-07-30 DIAGNOSIS — A159 Respiratory tuberculosis unspecified: Secondary | ICD-10-CM | POA: Insufficient documentation

## 2014-07-30 LAB — URINALYSIS, COMPLETE
Bilirubin, UA: NEGATIVE
Glucose, UA: NEGATIVE
Ketones, UA: NEGATIVE
Nitrite, UA: NEGATIVE
Protein, UA: NEGATIVE
RBC, UA: NEGATIVE
Specific Gravity, UA: 1.01 (ref 1.005–1.030)
Urobilinogen, Ur: 0.2 mg/dL (ref 0.2–1.0)
pH, UA: 5 (ref 5.0–7.5)

## 2014-07-30 LAB — MICROSCOPIC EXAMINATION
Bacteria, UA: NONE SEEN
WBC, UA: >30 /hpf — AB (ref 0–?)

## 2014-07-30 NOTE — Patient Instructions (Signed)
I will have patient return in 1 month for cystoscopy

## 2014-07-30 NOTE — Progress Notes (Signed)
   Subjective:    Patient ID: Carlos [name], male    DOB: [birth year], 81 y.o.   MRN: [id number]  HPI [name] is an 81 year old male patient who comes in with a complaint of frequency and urgency of urination. His frequency in urination is every hour during the day. He knows where every Road stop on a trip to [place] from [place] it's if he does not void he will lose his urine. His nocturia is times 2 at night. He drinks one cup of coffee a day and only drinks water in addition to his coffee intake. He drinks no soda or other caffeinated beverages. His flow is good he has no erection problems and no penile pain he is never seen a urologist has no history of prostate or bladder cancer but has a history of kidney stones. He remotely remembers seeing a Dr. Ernst Spell who was a urologist here who took care of his kidney stones with extracorporeal shockwave lithotripsy I ordered a postvoid residual on this patient and it was only 47 mL his urinalysis showed no bacteria but did show some white cells his only other problem in the genitourinary system is a 30% function of his kidneys Review of Systems Gastrointestinal system denies nausea vomiting indigestion and diarrhea constipation constitutional denies fever or night sweats weight loss the skin denies skin rash or itching eyes no blurred vision or double vision ear nose and throat no sore throat sinus problem hematologic lymphatic glands or easy bruising cardiovascular nose leg swelling or chest pain respiratory no cough shortness of breath endocrine no excessive thirst musculoskeletal positive for back pain joint pain neurologic denies headaches or dizziness psychologic denies depression or anxiety     Objective:   Physical Exam  Head-normocephalic, pupils equal and reactive to light, no hearing deficits.  Throat and neck-no thyroid enlargement, negative supraclavicular posterior or anterior lymphadenopathy  Chest-lungs clear to auscultation without  rales rhonchi or wheezing   Heart sounds are regular in rate and rhythm without murmur  Abdomen-abdomen soft bowel sounds present negative Murphy's Lloyd's McBurney slightly overweight but Wellner  Genitourinary-foreskin uncircumcised testes descended and normal, no inguinal hernias, glands normal, prostate exam shows a grade to prostate enlargement without nodule or asymmetry rectal sphincter is present and normal there are no rectal masses  Musculoskeletal-patient walks with an assistive device which is a cane, gait is steady without limp. Oriented  Orientation patient is oriented to person place and time, is cooperative,  Vital signs are stable and are noted      Assessment & Plan:  Assessment and plan are as follows I am going to obtain a KUB on this patient to make sure there are no calculi present as a cause of his 2 infections I do not believe that he has prostate as is the nidus for his infection nor does he have urinary retention I believe he has an overactive bladder and have given him Vesicare 5 mg daily for the next lung to deal with this frequency and urgency problem on his next visitation my plan is to do a cystoscopy and review his medication efficacy

## 2014-09-12 ENCOUNTER — Ambulatory Visit: Payer: Self-pay | Admitting: Urology

## 2014-09-15 ENCOUNTER — Other Ambulatory Visit: Payer: Medicare PPO | Admitting: Urology

## 2014-10-21 DIAGNOSIS — I071 Rheumatic tricuspid insufficiency: Secondary | ICD-10-CM | POA: Insufficient documentation

## 2014-10-22 ENCOUNTER — Other Ambulatory Visit: Payer: Medicare PPO | Admitting: Urology

## 2014-11-05 ENCOUNTER — Ambulatory Visit (INDEPENDENT_AMBULATORY_CARE_PROVIDER_SITE_OTHER): Payer: Medicare PPO | Admitting: Urology

## 2014-11-05 VITALS — BP 104/61 | HR 68 | Ht 67.0 in | Wt 208.1 lb

## 2014-11-05 DIAGNOSIS — N3000 Acute cystitis without hematuria: Secondary | ICD-10-CM | POA: Diagnosis not present

## 2014-11-05 DIAGNOSIS — N210 Calculus in bladder: Secondary | ICD-10-CM

## 2014-11-05 LAB — URINALYSIS, COMPLETE
Bilirubin, UA: NEGATIVE
Glucose, UA: NEGATIVE
Ketones, UA: NEGATIVE
Nitrite, UA: NEGATIVE
Protein, UA: NEGATIVE
Specific Gravity, UA: 1.01 (ref 1.005–1.030)
Urobilinogen, Ur: 0.2 mg/dL (ref 0.2–1.0)
pH, UA: 5.5 (ref 5.0–7.5)

## 2014-11-05 LAB — MICROSCOPIC EXAMINATION
RBC, UA: NONE SEEN /hpf (ref 0–?)
WBC, UA: >30 /hpf — ABNORMAL HIGH (ref 0–?)

## 2014-11-05 MED ORDER — LIDOCAINE HCL 2 % EX GEL
1.0000 "application " | Freq: Once | CUTANEOUS | Status: AC
  Administered 2014-11-05: 1 via URETHRAL

## 2014-11-05 MED ORDER — CIPROFLOXACIN HCL 500 MG PO TABS
500.0000 mg | ORAL_TABLET | Freq: Once | ORAL | Status: AC
  Administered 2014-11-05: 500 mg via ORAL

## 2014-11-05 MED ORDER — SOLIFENACIN SUCCINATE 5 MG PO TABS: 5.0000 mg | ORAL_TABLET | Freq: Every day | ORAL | Status: DC

## 2014-11-05 NOTE — Progress Notes (Signed)
    Cystoscopy Procedure Note  Patient identification was confirmed, informed consent was obtained, and patient was prepped using Betadine solution.  Lidocaine jelly was administered per urethral meatus.    Preoperative abx where received prior to procedure.     Pre-Procedure: - Inspection reveals a normal caliber ureteral meatus.  Procedure: The flexible cystoscope was introduced without difficulty - No urethral strictures/lesions are present. - Normal prostate  - Normal bladder neck - Bilateral ureteral orifices identified - Bladder mucosa  reveals no ulcers, tumors, or lesions - 3+ bladder stones noted - No trabeculation  Retroflexion shows normal bladder neck  Exam somewhat limited by debris in bladder   Post-Procedure: - Patient tolerated the procedure well  Assessment/Plan: 3+ bladder stones were noted within the bladder today. He also had a KUB x-ray that showed possible stones in the right lower pole of the kidney versus the appendix. Since he will need to the OR for evacuation of bladder stones, we will ensure that these probable stones on KUB are in fact now the stones in his bladder via retrograde pyelogram.  If there are stones seen on retrograde program, we will also attempt ureteroscopy at this time. We will also renew the patient's Vesicare which the patient felt was helpful for his overactive bladder symptoms as described in the previous progress note. The patient is agreeable with this treatment plan.

## 2014-11-14 ENCOUNTER — Telehealth: Payer: Self-pay | Admitting: Radiology

## 2014-11-14 ENCOUNTER — Ambulatory Visit: Payer: Medicare PPO

## 2014-11-14 DIAGNOSIS — N3281 Overactive bladder: Secondary | ICD-10-CM

## 2014-11-14 DIAGNOSIS — N390 Urinary tract infection, site not specified: Secondary | ICD-10-CM

## 2014-11-14 LAB — URINALYSIS, COMPLETE
Bilirubin, UA: NEGATIVE
Glucose, UA: NEGATIVE
Ketones, UA: NEGATIVE
Nitrite, UA: NEGATIVE
Protein, UA: NEGATIVE
Specific Gravity, UA: 1.01 (ref 1.005–1.030)
Urobilinogen, Ur: 0.2 mg/dL (ref 0.2–1.0)
pH, UA: 6.5 (ref 5.0–7.5)

## 2014-11-14 LAB — MICROSCOPIC EXAMINATION
Bacteria, UA: NONE SEEN
Epithelial Cells (non renal): NONE SEEN /hpf (ref 0–10)
RBC, UA: NONE SEEN /hpf (ref 0–?)

## 2014-11-14 MED ORDER — SOLIFENACIN SUCCINATE 5 MG PO TABS: 5.0000 mg | ORAL_TABLET | Freq: Every day | ORAL | Status: DC

## 2014-11-14 NOTE — Telephone Encounter (Signed)
Pt notified of surgery scheduled 12/03/14 and pre-admit appt 11/20/14. Pt voices understanding.

## 2014-11-14 NOTE — Telephone Encounter (Signed)
Pt states he was given samples of Vesicare and would like a prescription. He goes to [facility] in [place] or uses [facility] mail order prescriptions. Also states he has had nocturia which in the past was caused by a UTI. He is seeing his PCP about this next week.  Call back to cell [phone].

## 2014-11-14 NOTE — Progress Notes (Signed)
Pt came in and gave a clean catch specimen. Pt called earlier c/o frequency, burning and nocturia. Urine was sent for u/a and cx.

## 2014-11-14 NOTE — Telephone Encounter (Signed)
Spoke with pt who stated he thinks he may have an infection. Nurse asked if pt would like to give [place] a urine sample. Pt will come at 2:30 today. Pt requested a script of vesicare. Medication sent to mail order pharmacy.

## 2014-11-17 ENCOUNTER — Telehealth: Payer: Self-pay | Admitting: Radiology

## 2014-11-17 LAB — CULTURE, URINE COMPREHENSIVE

## 2014-11-17 NOTE — Telephone Encounter (Signed)
Pt notified to stop ASA and Coumadin on 11/28/14 per Dr [name]. Pt verbalizes understanding.

## 2014-11-18 ENCOUNTER — Telehealth: Payer: Self-pay

## 2014-11-18 NOTE — Telephone Encounter (Signed)
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  Please be on the look out for final culture results.

## 2014-11-18 NOTE — Telephone Encounter (Signed)
Urine cx results are back.

## 2014-11-20 ENCOUNTER — Encounter
Admission: RE | Admit: 2014-11-20 | Discharge: 2014-11-20 | Disposition: A | Discharge status: Home / Self Care | Payer: Medicare PPO | Source: Ambulatory Visit | Attending: Urology | Admitting: Urology

## 2014-11-20 DIAGNOSIS — Z01812 Encounter for preprocedural laboratory examination: Secondary | ICD-10-CM | POA: Diagnosis not present

## 2014-11-20 DIAGNOSIS — Z01810 Encounter for preprocedural cardiovascular examination: Secondary | ICD-10-CM | POA: Diagnosis present

## 2014-11-20 HISTORY — DX: Acute myocardial infarction, unspecified: I21.9

## 2014-11-20 HISTORY — DX: Gout, unspecified: M10.9

## 2014-11-20 HISTORY — DX: Pneumothorax, unspecified: J93.9

## 2014-11-20 HISTORY — DX: Atherosclerotic heart disease of native coronary artery without angina pectoris: I25.10

## 2014-11-20 HISTORY — DX: Chronic kidney disease, unspecified: N18.9

## 2014-11-20 HISTORY — DX: Gastro-esophageal reflux disease without esophagitis: K21.9

## 2014-11-20 HISTORY — DX: Peripheral vascular disease, unspecified: I73.9

## 2014-11-20 HISTORY — DX: Diverticulosis of intestine, part unspecified, without perforation or abscess without bleeding: K57.90

## 2014-11-20 HISTORY — DX: Heart failure, unspecified: I50.9

## 2014-11-20 HISTORY — DX: Hyperlipidemia, unspecified: E78.5

## 2014-11-20 HISTORY — DX: Unspecified atrial fibrillation: I48.91

## 2014-11-20 HISTORY — DX: Essential (primary) hypertension: I10

## 2014-11-20 HISTORY — DX: Unspecified osteoarthritis, unspecified site: M19.90

## 2014-11-20 HISTORY — DX: Type 2 diabetes mellitus without complications: E11.9

## 2014-11-20 LAB — BASIC METABOLIC PANEL
Anion gap: 8 (ref 5–15)
BUN: 30 mg/dL — ABNORMAL HIGH (ref 6–20)
CO2: 30 mmol/L (ref 22–32)
Calcium: 9.2 mg/dL (ref 8.9–10.3)
Chloride: 103 mmol/L (ref 101–111)
Creatinine, Ser: 1.89 mg/dL — ABNORMAL HIGH (ref 0.61–1.24)
GFR calc Af Amer: 35 mL/min — ABNORMAL LOW (ref >60)
GFR calc non Af Amer: 31 mL/min — ABNORMAL LOW (ref >60)
Glucose, Bld: 120 mg/dL — ABNORMAL HIGH (ref 65–99)
Potassium: 3.8 mmol/L (ref 3.5–5.1)
Sodium: 141 mmol/L (ref 135–145)

## 2014-11-20 LAB — PROTIME-INR
INR: 2.08
Prothrombin Time: 23.5 seconds — ABNORMAL HIGH (ref 11.4–15.0)

## 2014-11-20 NOTE — Patient Instructions (Addendum)
  Your procedure is scheduled on: December 03, 2014 (Wednesday) Report to Day Surgery.[facility]) Second Floor To find out your arrival time please call [phone] between 1PM - 3PM on December 02, 2014 (Tuesday).  Remember: Instructions that are not followed completely may result in serious medical risk, up to and including death, or upon the discretion of your surgeon and anesthesiologist your surgery may need to be rescheduled.    _x___ 1. Do not eat food or drink liquids after midnight. No gum chewing or hard candies.     _x___ 2. No Alcohol for 24 hours before or after surgery.   ____ 3. Bring all medications with you on the day of surgery if instructed.    _x___ 4. Notify your doctor if there is any change in your medical condition     (cold, fever, infections).     Do not wear jewelry, make-up, hairpins, clips or nail polish.  Do not wear lotions, powders, or perfumes. You may wear deodorant.  Do not shave 48 hours prior to surgery. Men may shave face and neck.  Do not bring valuables to the hospital.    [facility] is not responsible for any belongings or valuables.               Contacts, dentures or bridgework may not be worn into surgery.  Leave your suitcase in the car. After surgery it may be brought to your room.  For patients admitted to the hospital, discharge time is determined by your                treatment team.   Patients discharged the day of surgery will not be allowed to drive home.   Please read over the following fact sheets that you were given:      _x___ Take these medicines the morning of surgery with A SIP OF WATER:    1. Atenolol   2. Omeprazole ____ Fleet Enema (as directed)   ____ Use CHG Soap as directed  ____ Use inhalers on the day of surgery  ____ Stop metformin 2 days prior to surgery    ____ Take 1/2 of usual insulin dose the night before surgery and none on the morning of surgery.   __x__ Stop Coumadin/Plavix/aspirin on (Stop  Aspirin and Warfarin 5 (five) days prior to surgery per patient/per office)  ____ Stop Anti-inflammatories on    ____ Stop supplements until after surgery.    ____ Bring C-Pap to the hospital.

## 2014-11-21 NOTE — OR Nursing (Signed)
By Dr Rosey Bath, ekg ok to proceed

## 2014-11-22 LAB — URINE CULTURE: Culture: 80000

## 2014-11-24 NOTE — Pre-Procedure Instructions (Signed)
Amy Heidel, surgery coordinator , Dr. Pilar Jarvis office notified and faxed urine culture results.

## 2014-11-25 ENCOUNTER — Other Ambulatory Visit: Payer: Self-pay | Admitting: Urology

## 2014-11-25 ENCOUNTER — Other Ambulatory Visit: Payer: Self-pay

## 2014-11-25 DIAGNOSIS — N390 Urinary tract infection, site not specified: Secondary | ICD-10-CM

## 2014-11-25 MED ORDER — AMPICILLIN 500 MG PO CAPS: 500.0000 mg | ORAL_CAPSULE | Freq: Three times a day (TID) | ORAL | Status: DC

## 2014-11-25 NOTE — Progress Notes (Signed)
Patient notified that pre-op urine culture was positive and we sent abx to his pharmacy for him to start today

## 2014-11-25 NOTE — Telephone Encounter (Signed)
Called [facility] in [place] and gave a verbal order of ampicillin. Attempted to call pt to make aware but no one answered. Pharmacy stated they would call pt when medication is ready.

## 2014-12-01 ENCOUNTER — Telehealth: Payer: Self-pay | Admitting: Urology

## 2014-12-01 NOTE — Telephone Encounter (Signed)
Spoke with [employer] about RX with no signature. Let them know they could disregard the med was called into [facility] and the patient has picked it up and is currently taking it.

## 2014-12-01 NOTE — Telephone Encounter (Signed)
[facility] left voicemail message.  Have a Rx for Ampicillin but is missing Shannon's signature.  Telephone [phone] and Fax [phone]

## 2014-12-02 DIAGNOSIS — R001 Bradycardia, unspecified: Secondary | ICD-10-CM | POA: Insufficient documentation

## 2014-12-03 ENCOUNTER — Ambulatory Visit: Payer: Medicare PPO | Admitting: Registered Nurse

## 2014-12-03 ENCOUNTER — Ambulatory Visit
Admission: RE | Admit: 2014-12-03 | Discharge: 2014-12-03 | Disposition: A | Discharge status: Home / Self Care | Payer: Medicare PPO | Source: Ambulatory Visit | Attending: Urology | Admitting: Urology

## 2014-12-03 ENCOUNTER — Encounter: Payer: Self-pay | Admitting: *Deleted

## 2014-12-03 ENCOUNTER — Encounter
Admission: RE | Disposition: A | Discharge status: Home / Self Care | Payer: Self-pay | Source: Ambulatory Visit | Attending: Urology

## 2014-12-03 DIAGNOSIS — N200 Calculus of kidney: Secondary | ICD-10-CM | POA: Insufficient documentation

## 2014-12-03 DIAGNOSIS — I739 Peripheral vascular disease, unspecified: Secondary | ICD-10-CM | POA: Diagnosis not present

## 2014-12-03 DIAGNOSIS — N390 Urinary tract infection, site not specified: Secondary | ICD-10-CM

## 2014-12-03 DIAGNOSIS — I509 Heart failure, unspecified: Secondary | ICD-10-CM | POA: Insufficient documentation

## 2014-12-03 DIAGNOSIS — N3281 Overactive bladder: Secondary | ICD-10-CM | POA: Diagnosis not present

## 2014-12-03 DIAGNOSIS — Z87891 Personal history of nicotine dependence: Secondary | ICD-10-CM | POA: Diagnosis not present

## 2014-12-03 DIAGNOSIS — I252 Old myocardial infarction: Secondary | ICD-10-CM | POA: Insufficient documentation

## 2014-12-03 DIAGNOSIS — I10 Essential (primary) hypertension: Secondary | ICD-10-CM | POA: Diagnosis not present

## 2014-12-03 DIAGNOSIS — I2510 Atherosclerotic heart disease of native coronary artery without angina pectoris: Secondary | ICD-10-CM | POA: Insufficient documentation

## 2014-12-03 DIAGNOSIS — Z951 Presence of aortocoronary bypass graft: Secondary | ICD-10-CM | POA: Diagnosis not present

## 2014-12-03 DIAGNOSIS — K219 Gastro-esophageal reflux disease without esophagitis: Secondary | ICD-10-CM | POA: Insufficient documentation

## 2014-12-03 DIAGNOSIS — R0602 Shortness of breath: Secondary | ICD-10-CM | POA: Diagnosis not present

## 2014-12-03 DIAGNOSIS — N210 Calculus in bladder: Secondary | ICD-10-CM | POA: Insufficient documentation

## 2014-12-03 HISTORY — PX: CYSTOSCOPY WITH LITHOLAPAXY: SHX1425

## 2014-12-03 HISTORY — PX: CYSTOSCOPY W/ RETROGRADES: SHX1426

## 2014-12-03 LAB — PROTIME-INR
INR: 1.12
Prothrombin Time: 14.6 seconds (ref 11.4–15.0)

## 2014-12-03 LAB — GLUCOSE, CAPILLARY: Glucose-Capillary: 91 mg/dL (ref 65–99)

## 2014-12-03 SURGERY — CYSTOSCOPY, WITH BLADDER CALCULUS LITHOLAPAXY
Anesthesia: General | Wound class: Clean Contaminated

## 2014-12-03 MED ORDER — ONDANSETRON HCL 4 MG/2ML IJ SOLN
INTRAMUSCULAR | Status: DC | PRN
  Administered 2014-12-03: 4 mg via INTRAVENOUS

## 2014-12-03 MED ORDER — FENTANYL CITRATE (PF) 100 MCG/2ML IJ SOLN
25.0000 ug | INTRAMUSCULAR | Status: DC | PRN
  Administered 2014-12-03: 25 ug via INTRAVENOUS
  Administered 2014-12-03: 50 ug via INTRAVENOUS
  Administered 2014-12-03: 25 ug via INTRAVENOUS

## 2014-12-03 MED ORDER — EPHEDRINE SULFATE 50 MG/ML IJ SOLN
INTRAMUSCULAR | Status: DC | PRN
  Administered 2014-12-03: 10 mg via INTRAVENOUS

## 2014-12-03 MED ORDER — SODIUM CHLORIDE 0.9 % IR SOLN
Status: DC | PRN
  Administered 2014-12-03: 1500 mL

## 2014-12-03 MED ORDER — ONDANSETRON HCL 4 MG/2ML IJ SOLN: 4.0000 mg | Freq: Once | INTRAMUSCULAR | Status: DC | PRN

## 2014-12-03 MED ORDER — MIDAZOLAM HCL 2 MG/2ML IJ SOLN
INTRAMUSCULAR | Status: DC | PRN
  Administered 2014-12-03: 2 mg via INTRAVENOUS

## 2014-12-03 MED ORDER — PROPOFOL 10 MG/ML IV BOLUS
INTRAVENOUS | Status: DC | PRN
  Administered 2014-12-03: 150 mg via INTRAVENOUS

## 2014-12-03 MED ORDER — LIDOCAINE HCL 2 % EX GEL
CUTANEOUS | Status: DC | PRN
  Administered 2014-12-03: 1 via TOPICAL

## 2014-12-03 MED ORDER — HYDROCODONE-ACETAMINOPHEN 5-325 MG PO TABS: 1.0000 | ORAL_TABLET | Freq: Four times a day (QID) | ORAL | Status: DC | PRN

## 2014-12-03 MED ORDER — CIPROFLOXACIN HCL 500 MG PO TABS
500.0000 mg | ORAL_TABLET | Freq: Once | ORAL | Status: AC
  Administered 2014-12-03: 500 mg via ORAL

## 2014-12-03 MED ORDER — CIPROFLOXACIN HCL 500 MG PO TABS
ORAL_TABLET | ORAL | Status: AC
  Filled 2014-12-03: qty 1

## 2014-12-03 MED ORDER — SODIUM CHLORIDE 0.9 % IV SOLN
INTRAVENOUS | Status: DC
  Administered 2014-12-03: 07:00:00 via INTRAVENOUS

## 2014-12-03 MED ORDER — FENTANYL CITRATE (PF) 100 MCG/2ML IJ SOLN
INTRAMUSCULAR | Status: DC | PRN
  Administered 2014-12-03 (×2): 50 ug via INTRAVENOUS

## 2014-12-03 MED ORDER — GLYCOPYRROLATE 0.2 MG/ML IJ SOLN
INTRAMUSCULAR | Status: DC | PRN
  Administered 2014-12-03: 0.2 mg via INTRAVENOUS

## 2014-12-03 MED ORDER — LIDOCAINE HCL (CARDIAC) 20 MG/ML IV SOLN
INTRAVENOUS | Status: DC | PRN
  Administered 2014-12-03: 100 mg via INTRAVENOUS

## 2014-12-03 MED ORDER — AMPICILLIN 500 MG PO CAPS: 500.0000 mg | ORAL_CAPSULE | Freq: Three times a day (TID) | ORAL | Status: AC

## 2014-12-03 MED ORDER — FENTANYL CITRATE (PF) 100 MCG/2ML IJ SOLN
INTRAMUSCULAR | Status: AC
  Administered 2014-12-03: 50 ug via INTRAVENOUS
  Filled 2014-12-03: qty 2

## 2014-12-03 SURGICAL SUPPLY — 27 items
BACTOSHIELD CHG 4% 4OZ (MISCELLANEOUS) ×1
BASKET ZERO TIP 1.9FR (BASKET) ×1 IMPLANT
BSKT STON RTRVL ZERO TP 1.9FR (BASKET)
CATH URETL 5X70 OPEN END (CATHETERS) ×2 IMPLANT
CNTNR SPEC 2.5X3XGRAD LEK (MISCELLANEOUS) ×1
CONRAY 43 FOR UROLOGY 50M (MISCELLANEOUS) ×2 IMPLANT
CONT SPEC 4OZ STER OR WHT (MISCELLANEOUS) ×1
CONT SPEC 4OZ STRL OR WHT (MISCELLANEOUS) ×1
CONTAINER SPEC 2.5X3XGRAD LEK (MISCELLANEOUS) IMPLANT
FEE TECHNICIAN ONLY PER HOUR (MISCELLANEOUS) ×1 IMPLANT
GLOVE BIO SURGEON STRL SZ7 (GLOVE) ×4 IMPLANT
GLOVE BIO SURGEON STRL SZ7.5 (GLOVE) ×2 IMPLANT
GOWN L4 LG 24 PK N/S (GOWN DISPOSABLE) ×2 IMPLANT
GOWN STRL REUS W/ TWL XL LVL3 (GOWN DISPOSABLE) ×1 IMPLANT
GOWN STRL REUS W/TWL XL LVL3 (GOWN DISPOSABLE) ×4 IMPLANT
KIT RM TURNOVER CYSTO AR (KITS) ×2 IMPLANT
LASER HOLMIUM FIBER SU 272UM (MISCELLANEOUS) ×1 IMPLANT
LASER HOLMIUM FIBER SU 550UM (MISCELLANEOUS) ×1 IMPLANT
LASER HOLMIUM SU 200UM (MISCELLANEOUS) ×1 IMPLANT
PACK CYSTO AR (MISCELLANEOUS) ×2 IMPLANT
SCRUB CHG 4% DYNA-HEX 4OZ (MISCELLANEOUS) ×1 IMPLANT
SENSORWIRE 0.038 NOT ANGLED (WIRE) ×2
SET CYSTO W/LG BORE CLAMP LF (SET/KITS/TRAYS/PACK) ×2 IMPLANT
SURGILUBE 2OZ TUBE FLIPTOP (MISCELLANEOUS) ×2 IMPLANT
SYRINGE IRR TOOMEY STRL 70CC (SYRINGE) ×2 IMPLANT
TUBING CONNECTING 10 (TUBING) ×2 IMPLANT
WIRE SENSOR 0.038 NOT ANGLED (WIRE) ×1 IMPLANT

## 2014-12-03 NOTE — Anesthesia Procedure Notes (Signed)
Procedure Name: LMA Insertion Date/Time: 12/03/2014 7:46 AM Performed by: Doreen Salvage Pre-anesthesia Checklist: Patient identified, Patient being monitored, Timeout performed, Emergency Drugs available and Suction available Patient Re-evaluated:Patient Re-evaluated prior to inductionOxygen Delivery Method: Circle system utilized Preoxygenation: Pre-oxygenation with 100% oxygen Intubation Type: IV induction Ventilation: Mask ventilation without difficulty LMA: LMA inserted LMA Size: 5.0 Tube type: Oral Number of attempts: 1 Placement Confirmation: positive ETCO2 and breath sounds checked- equal and bilateral Tube secured with: Tape Dental Injury: Teeth and Oropharynx as per pre-operative assessment

## 2014-12-03 NOTE — Discharge Instructions (Signed)
AMBULATORY SURGERY  °DISCHARGE INSTRUCTIONS ° ° °1) The drugs that you were given will stay in your system until tomorrow so for the next 24 hours you should not: ° °A) Drive an automobile °B) Make any legal decisions °C) Drink any alcoholic beverage ° ° °2) You may resume regular meals tomorrow.  Today it is better to start with liquids and gradually work up to solid foods. ° °You may eat anything you prefer, but it is better to start with liquids, then soup and crackers, and gradually work up to solid foods. ° ° °3) Please notify your doctor immediately if you have any unusual bleeding, trouble breathing, redness and pain at the surgery site, drainage, fever, or pain not relieved by medication. ° ° ° °4) Additional Instructions: ° ° ° ° ° ° ° °Please contact your physician with any problems or Same Day Surgery at [phone], Monday through Friday 6 am to 4 pm, or [place] [facility] number at [phone].AMBULATORY SURGERY  °DISCHARGE INSTRUCTIONS ° ° °5) The drugs that you were given will stay in your system until tomorrow so for the next 24 hours you should not: ° °D) Drive an automobile °E) Make any legal decisions °F) Drink any alcoholic beverage ° ° °6) You may resume regular meals tomorrow.  Today it is better to start with liquids and gradually work up to solid foods. ° °You may eat anything you prefer, but it is better to start with liquids, then soup and crackers, and gradually work up to solid foods. ° ° °7) Please notify your doctor immediately if you have any unusual bleeding, trouble breathing, redness and pain at the surgery site, drainage, fever, or pain not relieved by medication. ° ° ° °8) Additional Instructions: ° ° ° ° ° ° ° °Please contact your physician with any problems or Same Day Surgery at [phone], Monday through Friday 6 am to 4 pm, or [place] [facility] number at [phone]. °

## 2014-12-03 NOTE — Interval H&P Note (Signed)
HR: RRR

## 2014-12-03 NOTE — H&P (View-Only) (Signed)
    Cystoscopy Procedure Note  Patient identification was confirmed, informed consent was obtained, and patient was prepped using Betadine solution.  Lidocaine jelly was administered per urethral meatus.    Preoperative abx where received prior to procedure.     Pre-Procedure: - Inspection reveals a normal caliber ureteral meatus.  Procedure: The flexible cystoscope was introduced without difficulty - No urethral strictures/lesions are present. - Normal prostate  - Normal bladder neck - Bilateral ureteral orifices identified - Bladder mucosa  reveals no ulcers, tumors, or lesions - 3+ bladder stones noted - No trabeculation  Retroflexion shows normal bladder neck  Exam somewhat limited by debris in bladder   Post-Procedure: - Patient tolerated the procedure well  Assessment/Plan: 3+ bladder stones were noted within the bladder today. He also had a KUB x-ray that showed possible stones in the right lower pole of the kidney versus the appendix. Since he will need to the OR for evacuation of bladder stones, we will ensure that these probable stones on KUB are in fact now the stones in his bladder via retrograde pyelogram.  If there are stones seen on retrograde program, we will also attempt ureteroscopy at this time. We will also renew the patient's Vesicare which the patient felt was helpful for his overactive bladder symptoms as described in the previous progress note. The patient is agreeable with this treatment plan.

## 2014-12-03 NOTE — Interval H&P Note (Signed)
History and Physical Interval Note:  12/03/2014 7:23 AM   AF VSS NAD Unlabored respiration Soft nt nd  Carlos [name]  has presented today for surgery, with the diagnosis of BLADDER STONE  The various methods of treatment have been discussed with the patient and family. After consideration of risks, benefits and other options for treatment, the patient has consented to  Procedure(s): CYSTOSCOPY WITH LITHOLAPAXY (N/A) CYSTOSCOPY WITH RETROGRADE PYELOGRAM (N/A) URETEROSCOPY WITH HOLMIUM LASER LITHOTRIPSY (N/A) as a surgical intervention .  The patient's history has been reviewed, patient examined, no change in status, stable for surgery.  I have reviewed the patient's chart and labs.  Questions were answered to the patient's satisfaction.     [name]

## 2014-12-03 NOTE — Op Note (Signed)
Preoperative diagnosis: Bladder calculus, right renal stone  Postoperative diagnosis: Same  Procedure: Cystoscopy, cystolitholapaxy, evacuation of stone fragments, attempted retrograde pyelogram  Surgeon: Brian Budzyn, M.D.  Findings: 2 cm bladder stone  Specimens: Bladder stone  Disposition: Stable to the post anesthesia care unit  Indications for procedure: The patient is an 81-year-old male who was recently diagnosed with a bladder stone the office. He also has what may be stones in his right kidney.  Description of procedure: The patient was met in the preoperative area where all risks, benefits, and indications for procedure were described in great detail. The patient consented to the procedure. Preoperative antibiotic given. The patient was then taken back to the operative theater. Gen. anesthesia was then induced per the anesthesia service. The patient was then prepped and draped in usual sterile fashion. A timeout was called. A 21 French cystoscope with 30 lens was inserted in the patient's bladder per urethra atraumatically. Immediately upon entering the bladder there is noted to be an approximately 2 cm stone. This was broken apart with a laser. All fragments were then evacuated. Pan cystoscopy was then unremarkable. Attempt was made to intubate the right ureteral orifice with an open ended catheter. This was unsuccessful. A sensor wire was then used to try to intubate the right ureteral orifice. This too was unsuccessful. At this point, decision was made to abort attempts at retrograde polygrams as to not cause undue trauma. At this point the patient's bladder was emptied. Scope was withdrawn. The patient was awoken from anesthesia and transferred in stable condition to the postanesthesia care unit in  Plan: The patient will follow up in to 3 weeks. We will get a KUB at that time to reassess the right renal stones. 

## 2014-12-03 NOTE — Anesthesia Preprocedure Evaluation (Signed)
Anesthesia Evaluation  Patient identified by MRN, date of birth, ID band Patient awake    Reviewed: Allergy & Precautions, H&P , NPO status , Patient's Chart, lab work & pertinent test results, reviewed documented beta blocker date and time   History of Anesthesia Complications Negative for: history of anesthetic complications  Airway Mallampati: III  TM Distance: >3 FB Neck ROM: full    Dental no notable dental hx.    Pulmonary shortness of breath and with exertion, neg sleep apnea, neg COPD, neg recent URI, former smoker,    Pulmonary exam normal breath sounds clear to auscultation       Cardiovascular Exercise Tolerance: Good hypertension, + CAD, + Past MI, + CABG (4 vessel in 2001), + Peripheral Vascular Disease and +CHF  (-) Cardiac Stents Normal cardiovascular exam+ dysrhythmias Atrial Fibrillation (-) Valvular Problems/Murmurs Rhythm:regular Rate:Normal     Neuro/Psych negative neurological ROS  negative psych ROS   GI/Hepatic Neg liver ROS, GERD  Medicated,  Endo/Other  diabetes ([place])  Renal/GU CRFRenal disease  negative genitourinary   Musculoskeletal   Abdominal   Peds  Hematology negative hematology ROS (+)   Anesthesia Other Findings Past Medical History:   Myocardial infarction ([place])                     2001         Hypertension                                                 Diabetes mellitus without complication ([place])                 Chronic kidney disease                                         Comment:kidney stones   Arthritis                                                    Pneumothorax, left                              1950         Peripheral vascular disease ([place])                            Gout                                                         CHF (congestive heart failure) (H[facility])                         GERD (gastroesophageal reflux disease)                       Coronary  artery disease  Diverticulosis                                               Atrial fibrillation ([place])                                    Hyperlipidemia                                               Reproductive/Obstetrics negative OB ROS                             Anesthesia Physical Anesthesia Plan  ASA: III  Anesthesia Plan: General   Post-op Pain Management:    Induction:   Airway Management Planned:   Additional Equipment:   Intra-op Plan:   Post-operative Plan:   Informed Consent: I have reviewed the patients History and Physical, chart, labs and discussed the procedure including the risks, benefits and alternatives for the proposed anesthesia with the patient or authorized representative who has indicated his/her understanding and acceptance.   Dental Advisory Given  Plan Discussed with: Anesthesiologist, CRNA and Surgeon  Anesthesia Plan Comments:         Anesthesia Quick Evaluation

## 2014-12-03 NOTE — Transfer of Care (Signed)
Immediate Anesthesia Transfer of Care Note  Patient: Carlos [name]  Procedure(s) Performed: Procedure(s): CYSTOSCOPY WITH LITHOLAPAXY WITH HOLMIUM LASER  (N/A) CYSTOSCOPY WITH ATTEMPT FOR RETROGRADE PYELOGRAM (N/A)  Patient Location: PACU  Anesthesia Type:General  Level of Consciousness: sedated  Airway & Oxygen Therapy: Patient Spontanous Breathing and Patient connected to face mask oxygen  Post-op Assessment: Report given to RN and Post -op Vital signs reviewed and stable  Post vital signs: Reviewed and stable  Last Vitals:  Filed Vitals:   12/03/14 0820  BP: 132/69  Pulse: 75  Temp: 36 C  Resp: 13    Complications: No apparent anesthesia complications

## 2014-12-04 NOTE — Anesthesia Postprocedure Evaluation (Signed)
  Anesthesia Post-op Note  Patient: Carlos [name]  Procedure(s) Performed: Procedure(s): CYSTOSCOPY WITH LITHOLAPAXY WITH HOLMIUM LASER  (N/A) CYSTOSCOPY WITH ATTEMPT FOR RETROGRADE PYELOGRAM (N/A)  Anesthesia type:General  Patient location: PACU  Post pain: Pain level controlled  Post assessment: Post-op Vital signs reviewed, Patient's Cardiovascular Status Stable, Respiratory Function Stable, Patent Airway and No signs of Nausea or vomiting  Post vital signs: Reviewed and stable  Last Vitals:  Filed Vitals:   12/03/14 0955  BP: 134/64  Pulse: 59  Temp:   Resp:     Level of consciousness: awake, alert  and patient cooperative  Complications: No apparent anesthesia complications

## 2014-12-05 ENCOUNTER — Telehealth: Payer: Self-pay | Admitting: Urology

## 2014-12-05 NOTE — Telephone Encounter (Signed)
Pt had surgery by Dr. Pilar Jarvis on Wed. 10/5, taking meds for frequent urination and antibiotics, still having urinary frequency and legs are swollen.  Please call pt [phone]

## 2014-12-05 NOTE — Telephone Encounter (Signed)
Spoke with pt in reference to leg swelling and urinary frequency. Per Dr. Pilar Jarvis pt should go to the ER to make sure there is no  Developing DVT. Made pt aware. Pt denied going to the ER. Pt stated the leg swelling for him was normal and he normally wears compression stockings. Reinforced with pt to take all medications given by Dr. Pilar Jarvis and to go straight to the ER if n/v, f/c, or leg pain(worse than normal) develops in his legs. Pt voiced understanding.

## 2014-12-09 LAB — STONE ANALYSIS
Ca Oxalate,Monohydr.: 17 %
Ca phos cry stone ql IR: 3 %
Stone Weight KSTONE: 608 mg
Uric Acid: 80 %

## 2014-12-24 ENCOUNTER — Encounter: Payer: Self-pay | Admitting: Urology

## 2014-12-24 ENCOUNTER — Ambulatory Visit
Admission: RE | Admit: 2014-12-24 | Discharge: 2014-12-24 | Disposition: A | Discharge status: Home / Self Care | Payer: Medicare PPO | Source: Ambulatory Visit | Attending: Urology | Admitting: Urology

## 2014-12-24 ENCOUNTER — Ambulatory Visit (INDEPENDENT_AMBULATORY_CARE_PROVIDER_SITE_OTHER): Payer: Medicare PPO | Admitting: Urology

## 2014-12-24 VITALS — BP 121/65 | HR 71 | Ht 67.0 in | Wt 211.1 lb

## 2014-12-24 DIAGNOSIS — N200 Calculus of kidney: Secondary | ICD-10-CM

## 2014-12-24 DIAGNOSIS — R350 Frequency of micturition: Secondary | ICD-10-CM | POA: Diagnosis not present

## 2014-12-24 DIAGNOSIS — N184 Chronic kidney disease, stage 4 (severe): Secondary | ICD-10-CM | POA: Insufficient documentation

## 2014-12-24 LAB — URINALYSIS, COMPLETE
Bilirubin, UA: NEGATIVE
Glucose, UA: NEGATIVE
Ketones, UA: NEGATIVE
Leukocytes, UA: NEGATIVE
Nitrite, UA: NEGATIVE
Protein, UA: NEGATIVE
RBC, UA: NEGATIVE
Specific Gravity, UA: 1.01 (ref 1.005–1.030)
Urobilinogen, Ur: 0.2 mg/dL (ref 0.2–1.0)
pH, UA: 5.5 (ref 5.0–7.5)

## 2014-12-24 LAB — MICROSCOPIC EXAMINATION
Bacteria, UA: NONE SEEN
Epithelial Cells (non renal): NONE SEEN /hpf (ref 0–10)
RBC, UA: NONE SEEN /hpf (ref 0–?)
Renal Epithel, UA: NONE SEEN /hpf
WBC, UA: NONE SEEN /hpf (ref 0–?)

## 2014-12-24 LAB — BLADDER SCAN AMB NON-IMAGING

## 2014-12-24 NOTE — Progress Notes (Signed)
12/24/2014 10:18 AM   Carlos [name] 04/09/1926 [id number]  Referring provider: [name], MD [address] [place], [place] [address]  Chief Complaint  Patient presents with  . Nephrolithiasis    3wk     HPI: The patient is a 81 year old old male who follows up after cystoscopy litholapaxy. His stone report showed 80% uric acid, calcium phosphate 3%, and calcium oxalate monohydrate 17%. He is a well since his surgery. His stoma no nocturia 2-3 and daytime frequency. He is on Flomax. His KUB from today still shows 2 right lower pole renal stones. These are unable to be addressed at his last surgery due to inability to intubate his right ureteral orifice.   PMH: Past Medical History  Diagnosis Date  . Myocardial infarction ([place]) 2001  . Hypertension   . Diabetes mellitus without complication ([place])   . Chronic kidney disease     kidney stones  . Arthritis   . Pneumothorax, left 1950  . Peripheral vascular disease ([place])   . Gout   . CHF (congestive heart failure) ([place])   . GERD (gastroesophageal reflux disease)   . Coronary artery disease   . Diverticulosis   . Atrial fibrillation ([place])   . Hyperlipidemia     Surgical History: Past Surgical History  Procedure Laterality Date  . Lung surgery Left   . Gallbladder surgery    . Hernia repair    . Replacement total knee    . Femur surgery    . Cardiac surgery      4 bypass  . Coronary artery bypass graft    . Cardiac catheterization    . Cholecystectomy    . Joint replacement Right     Total Knee Replacement  . Cystoscopy with litholapaxy N/A 12/03/2014    Procedure: CYSTOSCOPY WITH LITHOLAPAXY WITH HOLMIUM LASER ;  Surgeon: [name], MD;  Location: [facility] ORS;  Service: Urology;  Laterality: N/A;  . Cystoscopy w/ retrogrades N/A 12/03/2014    Procedure: CYSTOSCOPY WITH ATTEMPT FOR RETROGRADE PYELOGRAM;  Surgeon: [name], MD;  Location: [facility] ORS;  Service:  Urology;  Laterality: N/A;    Home Medications:    Medication List       This list is accurate as of: 12/24/14 10:18 AM.  Always use your most recent med list.               allopurinol 300 MG tablet  Commonly known as:  ZYLOPRIM  Take 150 mg by mouth daily.     aspirin EC 81 MG tablet  Take 81 mg by mouth daily.     atenolol 25 MG tablet  Commonly known as:  TENORMIN  TAKE 1 TABLET ONE TIME DAILY FOR BLOOD PRESSURE     colchicine 0.6 MG tablet  Take 0.6 mg by mouth as needed.     HYDROcodone-acetaminophen 5-325 MG tablet  Commonly known as:  NORCO  Take 1 tablet by mouth every 6 (six) hours as needed for moderate pain.     Investigational - Study Medication  Additional Study Details: Inject 1 each subcutaneously every fourteen days     levofloxacin 250 MG tablet  Commonly known as:  LEVAQUIN     MULTI-VITAMINS Tabs  Take 1 tablet by mouth daily.     omeprazole 20 MG capsule  Commonly known as:  PRILOSEC  Take 20 mg by mouth daily.     simvastatin 40 MG tablet  Commonly known  as:  ZOCOR  Take 40 mg by mouth daily at 6 PM.     solifenacin 5 MG tablet  Commonly known as:  VESICARE  Take 1 tablet (5 mg total) by mouth daily.     torsemide 20 MG tablet  Commonly known as:  DEMADEX  Take 20 mg by mouth daily.     traMADol 50 MG tablet  Commonly known as:  ULTRAM  Take 50 mg by mouth every 6 (six) hours as needed.     warfarin 3 MG tablet  Commonly known as:  COUMADIN  Take 3 mg by mouth daily at 6 PM.        Allergies:  Allergies  Allergen Reactions  . Morphine And Related Other (See Comments)    "nightmares"    Family History: Family History  Problem Relation Age of Onset  . Tuberculosis Brother     Social History:  reports that he quit smoking about 8 years ago. His smoking use included Pipe. He has never used smokeless tobacco. He reports that he drinks about 2.4 oz of alcohol per week. He reports that he does not use illicit  drugs.  ROS: UROLOGY Frequent Urination?: Yes Hard to postpone urination?: Yes Burning/pain with urination?: Yes Get up at night to urinate?: Yes Leakage of urine?: No Urine stream starts and stops?: No Trouble starting stream?: No Do you have to strain to urinate?: No Blood in urine?: No Urinary tract infection?: No Sexually transmitted disease?: No Injury to kidneys or bladder?: No Painful intercourse?: No Weak stream?: No Erection problems?: No Penile pain?: No  Gastrointestinal Nausea?: No Vomiting?: No Indigestion/heartburn?: No Diarrhea?: No Constipation?: No  Constitutional Fever: No Night sweats?: No Weight loss?: No Fatigue?: No  Skin Skin rash/lesions?: No Itching?: No  Eyes Blurred vision?: No Double vision?: No  Ears/Nose/Throat Sore throat?: No Sinus problems?: Yes  Hematologic/Lymphatic Swollen glands?: No Easy bruising?: No  Cardiovascular Leg swelling?: No Chest pain?: No  Respiratory Cough?: Yes Shortness of breath?: No  Endocrine Excessive thirst?: No  Musculoskeletal Back pain?: No Joint pain?: Yes  Neurological Headaches?: No Dizziness?: No  Psychologic Depression?: No Anxiety?: No  Physical Exam: BP 121/65 mmHg  Pulse 71  Ht 5\' 7"  (1.702 m)  Wt 211 lb 1.6 oz (95.754 kg)  BMI 33.06 kg/m2  Constitutional:  Alert and oriented, No acute distress. HEENT: [place] AT, moist mucus membranes.  Trachea midline, no masses. Cardiovascular: No clubbing, cyanosis, or edema. Respiratory: Normal respiratory effort, no increased work of breathing. GI: Abdomen is soft, nontender, nondistended, no abdominal masses GU: No CVA tenderness.  Skin: No rashes, bruises or suspicious lesions. Lymph: No cervical or inguinal adenopathy. Neurologic: Grossly intact, no focal deficits, moving all 4 extremities. Psychiatric: Normal mood and affect.  Laboratory Data: Lab Results  Component Value Date   WBC 8.2 12/24/2013   HGB 9.3*  12/24/2013   HCT 28.8* 12/24/2013   MCV 93 12/24/2013   PLT 368 12/24/2013    Lab Results  Component Value Date   CREATININE 1.89* 11/20/2014    No results found for: PSA  No results found for: TESTOSTERONE  No results found for: HGBA1C  Urinalysis    Component Value Date/Time   COLORURINE Yellow 12/12/2013 1512   APPEARANCEUR Clear 12/12/2013 1512   LABSPEC 1.006 12/12/2013 1512   PHURINE 5.0 12/12/2013 1512   GLUCOSEU Negative 11/14/2014 1436   GLUCOSEU Negative 12/12/2013 1512   HGBUR Negative 12/12/2013 1512   BILIRUBINUR Negative 11/14/2014 1436  BILIRUBINUR Negative 12/12/2013 1512   KETONESUR Negative 12/12/2013 1512   PROTEINUR Negative 12/12/2013 1512   NITRITE Negative 11/14/2014 1436   NITRITE Negative 12/12/2013 1512   LEUKOCYTESUR 2+* 11/14/2014 1436   LEUKOCYTESUR Trace 12/12/2013 1512    PVR: 140 cc  Assessment & Plan:    1. Nephrolithiasis The patient is two small right lower pole renal stones. I discussed the patient the best treatment for this would be lithotripsy. Though in his situation, doing nothing is also an option. I discussed with the patient that if he continues to have urinary tract infections than the stones may need to be addressed. However, since he is a 8 by this time we will address the stones also continues to have urinary tract infections.  2. BPH The patient has BPH and a history of a right bladder stone. He is able to adequately empty his bladder this time. We will need to discuss further intervention if he continues to have urinary tract infections. For now, we will continue his Flomax.  3. Bladder stone This has been surgically corrected. No further intervention at this time.   Return in about 3 months (around 03/26/2015) for PVR, uroflow, IPSS.  [name], MD  [facility] [address], [place], [place] [address] [phone]

## 2015-02-24 ENCOUNTER — Encounter: Payer: Self-pay | Admitting: Internal Medicine

## 2015-02-27 ENCOUNTER — Encounter: Payer: Self-pay | Admitting: Internal Medicine

## 2015-03-25 ENCOUNTER — Ambulatory Visit: Payer: Medicare PPO

## 2015-03-26 ENCOUNTER — Encounter: Payer: Self-pay | Admitting: Urology

## 2015-03-26 ENCOUNTER — Ambulatory Visit (INDEPENDENT_AMBULATORY_CARE_PROVIDER_SITE_OTHER): Payer: Medicare PPO | Admitting: Urology

## 2015-03-26 VITALS — BP 117/69 | HR 64 | Wt 216.9 lb

## 2015-03-26 DIAGNOSIS — N210 Calculus in bladder: Secondary | ICD-10-CM | POA: Diagnosis not present

## 2015-03-26 DIAGNOSIS — N200 Calculus of kidney: Secondary | ICD-10-CM

## 2015-03-26 DIAGNOSIS — K5909 Other constipation: Secondary | ICD-10-CM | POA: Diagnosis not present

## 2015-03-26 DIAGNOSIS — N400 Enlarged prostate without lower urinary tract symptoms: Secondary | ICD-10-CM | POA: Diagnosis not present

## 2015-03-26 LAB — URINALYSIS, COMPLETE
Bilirubin, UA: NEGATIVE
Glucose, UA: NEGATIVE
Ketones, UA: NEGATIVE
Nitrite, UA: NEGATIVE
Protein, UA: NEGATIVE
RBC, UA: NEGATIVE
Specific Gravity, UA: 1.015 (ref 1.005–1.030)
Urobilinogen, Ur: 0.2 mg/dL (ref 0.2–1.0)
pH, UA: 5.5 (ref 5.0–7.5)

## 2015-03-26 LAB — MICROSCOPIC EXAMINATION

## 2015-03-26 LAB — BLADDER SCAN AMB NON-IMAGING: Scan Result: 38

## 2015-03-26 NOTE — Progress Notes (Signed)
Bladder Scan Patient  void: 38 ml Performed By: Donnie Mesa

## 2015-03-26 NOTE — Progress Notes (Signed)
03/26/2015 11:44 AM   Carlos [name] 1926-09-21 [id number]  Referring provider: [name], MD [place], [place] [address]  Chief Complaint  Patient presents with  . Follow-up    BPH    HPI: The patient is a 82 year old old male who follows up after cystoscopy litholapaxy. His stone report showed 80% uric acid, calcium phosphate 3%, and calcium oxalate monohydrate 17%. He is a well since his surgery. His stoma no nocturia 2-3 and daytime frequency. He is on Flomax. His KUB from today still shows two 8 mm right lower pole renal stones. These are unable to be addressed at his last surgery due to inability to intubate his right ureteral orifice.    January 2017 Interval History: Patient presents for follow-up. He has been done well since his last visit. His I PSS courses 4/2. He's had no urinary tract infections. He has nocturia 1. He has urgency about half the time. He is mostly satisfied with his urinary symptoms. He has not had any symptoms of passing a kidney stone this time. He has had no problems since his bladder stones removed. He is controlled on Flomax. He does complain of constipation though. He is not a good bowel movement in approximately 1 week.  PMH: Past Medical History  Diagnosis Date  . Myocardial infarction ([place]) 2001  . Hypertension   . Diabetes mellitus without complication ([place])   . Chronic kidney disease     kidney stones  . Arthritis   . Pneumothorax, left 1950  . Peripheral vascular disease ([place])   . Gout   . CHF (congestive heart failure) ([place])   . GERD (gastroesophageal reflux disease)   . Coronary artery disease   . Diverticulosis   . Atrial fibrillation ([place])   . Hyperlipidemia     Surgical History: Past Surgical History  Procedure Laterality Date  . Lung surgery Left   . Gallbladder surgery    . Hernia repair    . Replacement total knee    . Femur surgery    . Cardiac surgery      4  bypass  . Coronary artery bypass graft    . Cardiac catheterization    . Cholecystectomy    . Joint replacement Right     Total Knee Replacement  . Cystoscopy with litholapaxy N/A 12/03/2014    Procedure: CYSTOSCOPY WITH LITHOLAPAXY WITH HOLMIUM LASER ;  Surgeon: [name], MD;  Location: [facility] ORS;  Service: Urology;  Laterality: N/A;  . Cystoscopy w/ retrogrades N/A 12/03/2014    Procedure: CYSTOSCOPY WITH ATTEMPT FOR RETROGRADE PYELOGRAM;  Surgeon: [name], MD;  Location: [facility] ORS;  Service: Urology;  Laterality: N/A;    Home Medications:    Medication List       This list is accurate as of: 03/26/15 11:44 AM.  Always use your most recent med list.               allopurinol 300 MG tablet  Commonly known as:  ZYLOPRIM  Take 150 mg by mouth daily.     aspirin EC 81 MG tablet  Take 81 mg by mouth daily.     atenolol 25 MG tablet  Commonly known as:  TENORMIN  TAKE 1 TABLET ONE TIME DAILY FOR BLOOD PRESSURE     cefUROXime 500 MG tablet  Commonly known as:  CEFTIN  Reported on 03/26/2015     colchicine 0.6 MG tablet  Take  0.6 mg by mouth as needed.     HYDROcodone-acetaminophen 5-325 MG tablet  Commonly known as:  NORCO  Take 1 tablet by mouth every 6 (six) hours as needed for moderate pain.     Investigational - Study Medication  Additional Study Details: Inject 1 each subcutaneously every fourteen days     levofloxacin 250 MG tablet  Commonly known as:  LEVAQUIN     MULTI-VITAMINS Tabs  Take 1 tablet by mouth daily.     omeprazole 20 MG capsule  Commonly known as:  PRILOSEC  Take 20 mg by mouth daily.     pantoprazole 40 MG tablet  Commonly known as:  PROTONIX  Take by mouth.     simvastatin 40 MG tablet  Commonly known as:  ZOCOR  Take 40 mg by mouth daily at 6 PM.     solifenacin 5 MG tablet  Commonly known as:  VESICARE  Take 1 tablet (5 mg total) by mouth daily.     torsemide 20 MG tablet  Commonly known as:  DEMADEX  Take 20  mg by mouth daily.     traMADol 50 MG tablet  Commonly known as:  ULTRAM  Take 50 mg by mouth every 6 (six) hours as needed.     valACYclovir 1000 MG tablet  Commonly known as:  VALTREX     warfarin 3 MG tablet  Commonly known as:  COUMADIN  Take 3 mg by mouth daily at 6 PM.        Allergies:  Allergies  Allergen Reactions  . Morphine And Related Other (See Comments)    "nightmares"    Family History: Family History  Problem Relation Age of Onset  . Tuberculosis Brother     Social History:  reports that he quit smoking about 8 years ago. His smoking use included Pipe. He has never used smokeless tobacco. He reports that he drinks about 2.4 oz of alcohol per week. He reports that he does not use illicit drugs.  ROS: UROLOGY Frequent Urination?: No Hard to postpone urination?: No Burning/pain with urination?: No Get up at night to urinate?: No Leakage of urine?: No Urine stream starts and stops?: No Trouble starting stream?: No Do you have to strain to urinate?: No Blood in urine?: No Urinary tract infection?: No Sexually transmitted disease?: No Injury to kidneys or bladder?: No Painful intercourse?: No Weak stream?: No Erection problems?: No Penile pain?: No  Gastrointestinal Nausea?: No Vomiting?: No Indigestion/heartburn?: No Diarrhea?: No Constipation?: Yes  Constitutional Fever: No Night sweats?: No Weight loss?: No Fatigue?: No  Skin Skin rash/lesions?: No Itching?: No  Eyes Blurred vision?: No Double vision?: No  Ears/Nose/Throat Sore throat?: No Sinus problems?: No  Hematologic/Lymphatic Swollen glands?: No Easy bruising?: No  Cardiovascular Leg swelling?: No Chest pain?: Yes  Respiratory Cough?: Yes Shortness of breath?: No  Endocrine Excessive thirst?: No  Musculoskeletal Back pain?: No Joint pain?: No  Neurological Headaches?: Yes Dizziness?: Yes  Psychologic Depression?: No Anxiety?: No  Physical  Exam: BP 117/69 mmHg  Pulse 64  Wt 216 lb 14.4 oz (98.385 kg)  Constitutional:  Alert and oriented, No acute distress. HEENT: [place] AT, moist mucus membranes.  Trachea midline, no masses. Cardiovascular: No clubbing, cyanosis, or edema. Respiratory: Normal respiratory effort, no increased work of breathing. GI: Abdomen is soft, nontender, nondistended, no abdominal masses GU: No CVA tenderness.  Skin: No rashes, bruises or suspicious lesions. Lymph: No cervical or inguinal adenopathy. Neurologic: Grossly intact, no focal deficits,  moving all 4 extremities. Psychiatric: Normal mood and affect.  Laboratory Data: Lab Results  Component Value Date   WBC 8.2 12/24/2013   HGB 9.3* 12/24/2013   HCT 28.8* 12/24/2013   MCV 93 12/24/2013   PLT 368 12/24/2013    Lab Results  Component Value Date   CREATININE 1.89* 11/20/2014    No results found for: PSA  No results found for: TESTOSTERONE  No results found for: HGBA1C  Urinalysis    Component Value Date/Time   COLORURINE Yellow 12/12/2013 1512   APPEARANCEUR Clear 12/12/2013 1512   LABSPEC 1.006 12/12/2013 1512   PHURINE 5.0 12/12/2013 1512   GLUCOSEU Negative 12/24/2014 1003   GLUCOSEU Negative 12/12/2013 1512   HGBUR Negative 12/12/2013 1512   BILIRUBINUR Negative 12/24/2014 1003   BILIRUBINUR Negative 12/12/2013 1512   KETONESUR Negative 12/12/2013 1512   PROTEINUR Negative 12/12/2013 1512   NITRITE Negative 12/24/2014 1003   NITRITE Negative 12/12/2013 1512   LEUKOCYTESUR Negative 12/24/2014 1003   LEUKOCYTESUR Trace 12/12/2013 1512    Pertinent Imaging:  Study Result     CLINICAL DATA: 82 year old male status post lithotripsy on October 5th. Nephrolithiasis. Subsequent encounter.  EXAM: ABDOMEN - 1 VIEW  COMPARISON: 07/30/2014 abdominal radiographs  FINDINGS: Stable cholecystectomy clips. Two 7-9 mm right lower pole region calculi are unchanged from June. Small probable phlebolith projecting over the  right superior pubic ramus is stable. No other new urologic calculus identified.  Sequelae of left inguinal hernia repair and right proximal femur ORIF. Osteopenia, scoliosis, degenerative changes in the spine. Non obstructed bowel gas pattern.  IMPRESSION: 1. Stable right lower pole region calculi since June. No other urologic calculus identified. 2. No acute findings identified.     Assessment & Plan:    1. Nephrolithiasis The patient is not ashen having is two 8 mm nonobstructing lower pole right stone treated this time. We will get a KUB at his next visit in 6 months to ensure there are not changing in size.  2. BPH -continue flomax  3. Bladder stone This has been surgically corrected. No further intervention at this time.  4. Constipation Recommended over-the-counter Dulcolax to relieve this.  Return in about 6 months (around 09/23/2015) for with KUB prior.  [name], MD  [facility] [address], [place], [place] [address] [phone]

## 2015-09-08 DIAGNOSIS — M25512 Pain in left shoulder: Secondary | ICD-10-CM | POA: Insufficient documentation

## 2015-09-11 ENCOUNTER — Other Ambulatory Visit: Payer: Self-pay

## 2015-09-11 DIAGNOSIS — N210 Calculus in bladder: Secondary | ICD-10-CM

## 2015-09-11 MED ORDER — SOLIFENACIN SUCCINATE 5 MG PO TABS: 5.0000 mg | ORAL_TABLET | Freq: Every day | ORAL | Status: DC

## 2015-09-14 ENCOUNTER — Other Ambulatory Visit: Payer: Self-pay

## 2015-09-14 DIAGNOSIS — N210 Calculus in bladder: Secondary | ICD-10-CM

## 2015-09-14 MED ORDER — SOLIFENACIN SUCCINATE 5 MG PO TABS: 5.0000 mg | ORAL_TABLET | Freq: Every day | ORAL | Status: DC

## 2015-09-23 ENCOUNTER — Ambulatory Visit
Admission: RE | Admit: 2015-09-23 | Discharge: 2015-09-23 | Disposition: A | Discharge status: Home / Self Care | Payer: Medicare PPO | Source: Ambulatory Visit | Attending: Urology | Admitting: Urology

## 2015-09-23 ENCOUNTER — Ambulatory Visit (INDEPENDENT_AMBULATORY_CARE_PROVIDER_SITE_OTHER): Payer: Medicare PPO | Admitting: Urology

## 2015-09-23 VITALS — BP 110/69 | Ht 67.5 in | Wt 208.6 lb

## 2015-09-23 DIAGNOSIS — N400 Enlarged prostate without lower urinary tract symptoms: Secondary | ICD-10-CM | POA: Insufficient documentation

## 2015-09-23 DIAGNOSIS — N200 Calculus of kidney: Secondary | ICD-10-CM

## 2015-09-23 DIAGNOSIS — Z87448 Personal history of other diseases of urinary system: Secondary | ICD-10-CM | POA: Diagnosis not present

## 2015-09-23 NOTE — Progress Notes (Signed)
09/23/2015 9:53 AM   Carlos [name] 1926-03-17 [id number]  Referring provider: [name], MD [place], [place] [address]  Chief Complaint  Patient presents with  . Nephrolithiasis    f/u KUB    HPI:  The patient is a 82 year old old male who follows up.   1. Nephrolithiasis History of 28 mm right lower pole stones are unable to be reached during recent cystoscopy litholapaxy due to the inability to intubate the right ureteral orifice. He presents today for a KUB to monitor size.  2. History of bladder stones s/p cystolithopaxy His stone report showed 80% uric acid, calcium phosphate 3%, and calcium oxalate monohydrate 17%.  3. BPH On flomax. Symptoms well controlled. Nocturia 1-2. Good stream. Feels like he empties his bladder. He ran out of medication for a couple days and could tell the difference when he was not taking it. He is very happy with his urinary quality of life.    PMH: Past Medical History:  Diagnosis Date  . Arthritis   . Atrial fibrillation ([place])   . CHF (congestive heart failure) ([place])   . Chronic kidney disease    kidney stones  . Coronary artery disease   . Diabetes mellitus without complication ([place])   . Diverticulosis   . GERD (gastroesophageal reflux disease)   . Gout   . Hyperlipidemia   . Hypertension   . Myocardial infarction ([place]) 2001  . Peripheral vascular disease ([place])   . Pneumothorax, left 1950    Surgical History: Past Surgical History:  Procedure Laterality Date  . CARDIAC CATHETERIZATION    . CARDIAC SURGERY     4 bypass  . CHOLECYSTECTOMY    . CORONARY ARTERY BYPASS GRAFT    . CYSTOSCOPY W/ RETROGRADES N/A 12/03/2014   Procedure: CYSTOSCOPY WITH ATTEMPT FOR RETROGRADE PYELOGRAM;  Surgeon: [name], MD;  Location: [facility] ORS;  Service: Urology;  Laterality: N/A;  . CYSTOSCOPY WITH LITHOLAPAXY N/A 12/03/2014   Procedure: CYSTOSCOPY WITH LITHOLAPAXY WITH  HOLMIUM LASER ;  Surgeon: [name], MD;  Location: [facility] ORS;  Service: Urology;  Laterality: N/A;  . FEMUR SURGERY    . GALLBLADDER SURGERY    . HERNIA REPAIR    . JOINT REPLACEMENT Right    Total Knee Replacement  . LUNG SURGERY Left   . REPLACEMENT TOTAL KNEE      Home Medications:    Medication List       Accurate as of 09/23/15  9:53 AM. Always use your most recent med list.          allopurinol 300 MG tablet Commonly known as:  ZYLOPRIM Take 150 mg by mouth daily.   aspirin EC 81 MG tablet Take 81 mg by mouth daily.   atenolol 25 MG tablet Commonly known as:  TENORMIN TAKE 1 TABLET ONE TIME DAILY FOR BLOOD PRESSURE   colchicine 0.6 MG tablet Take 0.6 mg by mouth as needed.   doxycycline 100 MG capsule Commonly known as:  VIBRAMYCIN Take 100 mg by mouth.   HYDROcodone-acetaminophen 5-325 MG tablet Commonly known as:  NORCO Take 1 tablet by mouth every 6 (six) hours as needed for moderate pain.   Investigational - Study Medication Additional Study Details: Inject 1 each subcutaneously every fourteen days   MULTI-VITAMINS Tabs Take 1 tablet by mouth daily.   omeprazole 20 MG capsule Commonly known as:  PRILOSEC Take 20 mg by mouth daily.   pantoprazole  40 MG tablet Commonly known as:  PROTONIX Take by mouth.   simvastatin 40 MG tablet Commonly known as:  ZOCOR Take 40 mg by mouth daily at 6 PM.   solifenacin 5 MG tablet Commonly known as:  VESICARE Take 1 tablet (5 mg total) by mouth daily.   torsemide 20 MG tablet Commonly known as:  DEMADEX Take 20 mg by mouth daily.   traMADol 50 MG tablet Commonly known as:  ULTRAM Take 50 mg by mouth every 6 (six) hours as needed.   warfarin 3 MG tablet Commonly known as:  COUMADIN Take 3 mg by mouth daily at 6 PM.       Allergies:  Allergies  Allergen Reactions  . Morphine And Related Other (See Comments)    "nightmares"    Family History: Family History  Problem Relation Age of  Onset  . Tuberculosis Brother     Social History:  reports that he quit smoking about 9 years ago. His smoking use included Pipe. He has never used smokeless tobacco. He reports that he drinks about 2.4 oz of alcohol per week . He reports that he does not use drugs.  ROS: UROLOGY Frequent Urination?: No Hard to postpone urination?: No Burning/pain with urination?: No Get up at night to urinate?: No Leakage of urine?: No Urine stream starts and stops?: No Trouble starting stream?: No Do you have to strain to urinate?: No Blood in urine?: No Urinary tract infection?: No Sexually transmitted disease?: No Injury to kidneys or bladder?: No Painful intercourse?: No Weak stream?: No Erection problems?: No Penile pain?: No  Gastrointestinal Nausea?: No Vomiting?: No Indigestion/heartburn?: No Diarrhea?: No Constipation?: No  Constitutional Fever: No Night sweats?: No Weight loss?: No Fatigue?: No  Skin Skin rash/lesions?: No Itching?: No  Eyes Blurred vision?: No Double vision?: No  Ears/Nose/Throat Sore throat?: No Sinus problems?: No  Hematologic/Lymphatic Swollen glands?: No Easy bruising?: No  Cardiovascular Leg swelling?: No Chest pain?: No  Respiratory Cough?: No Shortness of breath?: No  Endocrine Excessive thirst?: No  Musculoskeletal Back pain?: No Joint pain?: No  Neurological Headaches?: No Dizziness?: No  Psychologic Depression?: No Anxiety?: No  Physical Exam: BP 110/69 (BP Location: Left Arm, Patient Position: Sitting, Cuff Size: Large)   Ht 5' 7.5" (1.715 m)   Wt 208 lb 9.6 oz (94.6 kg)   BMI 32.19 kg/m   Constitutional:  Alert and oriented, No acute distress. HEENT: [place] AT, moist mucus membranes.  Trachea midline, no masses. Cardiovascular: No clubbing, cyanosis, or edema. Respiratory: Normal respiratory effort, no increased work of breathing. GI: Abdomen is soft, nontender, nondistended, no abdominal masses GU: No CVA  tenderness.  Skin: No rashes, bruises or suspicious lesions. Lymph: No cervical or inguinal adenopathy. Neurologic: Grossly intact, no focal deficits, moving all 4 extremities. Psychiatric: Normal mood and affect.  Laboratory Data: Lab Results  Component Value Date   WBC 8.2 12/24/2013   HGB 9.3 (L) 12/24/2013   HCT 28.8 (L) 12/24/2013   MCV 93 12/24/2013   PLT 368 12/24/2013    Lab Results  Component Value Date   CREATININE 1.89 (H) 11/20/2014    No results found for: PSA  No results found for: TESTOSTERONE  No results found for: HGBA1C  Urinalysis    Component Value Date/Time   COLORURINE Yellow 12/12/2013 1512   APPEARANCEUR Clear 03/26/2015 1132   LABSPEC 1.006 12/12/2013 1512   PHURINE 5.0 12/12/2013 1512   GLUCOSEU Negative 03/26/2015 1132   GLUCOSEU Negative 12/12/2013 1512  HGBUR Negative 12/12/2013 1512   BILIRUBINUR Negative 03/26/2015 1132   BILIRUBINUR Negative 12/12/2013 1512   KETONESUR Negative 12/12/2013 1512   PROTEINUR Negative 03/26/2015 1132   PROTEINUR Negative 12/12/2013 1512   NITRITE Negative 03/26/2015 1132   NITRITE Negative 12/12/2013 1512   LEUKOCYTESUR Trace (A) 03/26/2015 1132   LEUKOCYTESUR Trace 12/12/2013 1512    Pertinent Imaging: CLINICAL DATA:  History of kidney stones with right stone removal last year, currently asymptomatic. EXAM: ABDOMEN - 1 VIEW COMPARISON:  KUB of December 24, 2014 FINDINGS: There are 2 coarse stable appearing calcifications projecting over the lower pole of the right kidney. 1 measures 7 mm and the second measures 5 mm in diameter. No left-sided stones, ureteral stones, or bladder stones are observed. The colonic stool burden is mildly increased. There surgical clips in the gallbladder fossa. The patient has undergone previous left inguinal hernia repair and ORIF for right proximal femoral fracture. There are degenerative changes of the lumbar spine. IMPRESSION: Stable appearing densely  calcified kidney stones projecting in the region of the lower pole of the right kidney.  Assessment & Plan:    1. Nephrolithiasis Stable 7 mm and 5 mm right lower pole stones. Continue active surveillance with KUB in one year  2. BPH -continue flomax  3. History of bladder stone This has been surgically corrected. No further intervention at this time.  Return in about 1 year (around 09/22/2016) for with KUB prior.  [name], MD  [facility] [address], [place], [place] [address] [phone]

## 2015-12-07 ENCOUNTER — Encounter: Payer: Self-pay | Admitting: *Deleted

## 2015-12-07 ENCOUNTER — Ambulatory Visit
Admission: EM | Admit: 2015-12-07 | Discharge: 2015-12-07 | Disposition: A | Discharge status: Home / Self Care | Payer: Medicare PPO | Attending: Family Medicine | Admitting: Family Medicine

## 2015-12-07 DIAGNOSIS — M940 Chondrocostal junction syndrome [Tietze]: Secondary | ICD-10-CM

## 2015-12-07 NOTE — ED Triage Notes (Signed)
Patient was spreading mulch a week ago and possibly pulled a muscle in his right chest. Pain had resolved but return yesterday. Has been treated by his PCP for this same problem a week ago.

## 2015-12-07 NOTE — ED Provider Notes (Signed)
[facility]    CSN: [id number] Arrival date & time: 12/07/15  0956     History   Chief Complaint Chief Complaint  Patient presents with  . Chest Pain  . Shortness of Breath    HPI Carlos [name] is a 82 y.o. male.    Chest Pain  Pain location:  R lateral chest Pain quality: stabbing   Pain quality comment:  "catching" Pain radiates to:  Does not radiate Pain severity:  Mild Onset quality:  Sudden Duration:  1 day Timing:  Intermittent Progression:  Unchanged Chronicity:  New Context: breathing, raising an arm and trauma (fell 2 weeks ago)   Relieved by:  None tried Ineffective treatments:  None tried Associated symptoms: shortness of breath   Associated symptoms: no abdominal pain, no AICD problem, no altered mental status, no anorexia, no anxiety, no back pain, no claudication, no cough, no diaphoresis, no dizziness, no dysphagia, no fatigue, no fever, no headache, no heartburn, no lower extremity edema, no nausea, no near-syncope, no numbness, no orthopnea, no palpitations, no PND, no syncope, no vomiting and no weakness   Shortness of Breath  Associated symptoms: chest pain   Associated symptoms: no abdominal pain, no claudication, no cough, no diaphoresis, no fever, no headaches, no PND, no syncope and no vomiting     Past Medical History:  Diagnosis Date  . Arthritis   . Atrial fibrillation ([place])   . CHF (congestive heart failure) ([place])   . Chronic kidney disease    kidney stones  . Coronary artery disease   . Diabetes mellitus without complication ([place])   . Diverticulosis   . GERD (gastroesophageal reflux disease)   . Gout   . Hyperlipidemia   . Hypertension   . Myocardial infarction 2001  . Peripheral vascular disease ([place])   . Pneumothorax, left 1950    Patient Active Problem List   Diagnosis Date Noted  . Acute pain of left shoulder 09/08/2015  . CKD (chronic kidney disease), stage IV ([place]) 12/24/2014  . Bradycardia 12/02/2014  . TI  (tricuspid incompetence) 10/21/2014  . DD (diverticular disease) 07/30/2014  . Essential (primary) hypertension 07/30/2014  . Tuberculosis 07/30/2014  . Hyperlipidemia 07/30/2014  . Chronic systolic CHF (congestive heart failure), NYHA class 2 ([place]) 02/12/2014  . AF (paroxysmal atrial fibrillation) ([place]) 02/12/2014  . Paroxysmal a-fib ([place]) 02/12/2014  . Accumulation of fluid in tissues 07/24/2013  . Acid reflux 07/24/2013  . Gout 07/24/2013  . Diabetes mellitus, type 2 ([place]) 07/24/2013  . Type 2 diabetes mellitus with renal manifestations, controlled ([place]) 07/24/2013  . Peripheral blood vessel disorder ([place]) 07/19/2013  . PVD (peripheral vascular disease) ([place]) 07/19/2013  . Combined fat and carbohydrate induced hyperlipemia 05/21/2013  . Encounter for therapeutic drug monitoring 12/18/2012  . Chronic kidney disease (CKD), stage IV (severe) ([place]) 12/18/2012  . Arteriosclerosis of coronary artery 12/18/2012  . Decreased potassium in the blood 12/18/2012  . H/O coronary artery bypass surgery 12/18/2012  . H/O total knee replacement 12/18/2012  . Encounter for therapeutic drug level monitoring 12/18/2012    Past Surgical History:  Procedure Laterality Date  . CARDIAC CATHETERIZATION    . CARDIAC SURGERY     4 bypass  . CHOLECYSTECTOMY    . CORONARY ARTERY BYPASS GRAFT    . CYSTOSCOPY W/ RETROGRADES N/A 12/03/2014   Procedure: CYSTOSCOPY WITH ATTEMPT FOR RETROGRADE PYELOGRAM;  Surgeon: [name], MD;  Location: [facility];  Service: Urology;  Laterality: N/A;  . CYSTOSCOPY WITH LITHOLAPAXY  N/A 12/03/2014   Procedure: CYSTOSCOPY WITH LITHOLAPAXY WITH HOLMIUM LASER ;  Surgeon: [name], MD;  Location: [facility];  Service: Urology;  Laterality: N/A;  . FEMUR SURGERY    . GALLBLADDER SURGERY    . HERNIA REPAIR    . JOINT REPLACEMENT Right    Total Knee Replacement  . LUNG SURGERY Left   . REPLACEMENT TOTAL KNEE         Home Medications    Prior to Admission  medications   Medication Sig Start Date End Date Taking? Authorizing Provider  allopurinol (ZYLOPRIM) 300 MG tablet Take 150 mg by mouth daily.  05/09/14  Yes Historical Provider, MD  aspirin EC 81 MG tablet Take 81 mg by mouth daily.    Yes Historical Provider, MD  atenolol (TENORMIN) 25 MG tablet TAKE 1 TABLET ONE TIME DAILY FOR BLOOD PRESSURE 01/21/14  Yes Historical Provider, MD  colchicine 0.6 MG tablet Take 0.6 mg by mouth as needed.    Yes Historical Provider, MD  HYDROcodone-acetaminophen (NORCO) 5-325 MG tablet Take 1 tablet by mouth every 6 (six) hours as needed for moderate pain. 12/03/14  Yes [name], MD  Multiple Vitamin (MULTI-VITAMINS) TABS Take 1 tablet by mouth daily.    Yes Historical Provider, MD  omeprazole (PRILOSEC) 20 MG capsule Take 20 mg by mouth daily.  05/19/14  Yes Historical Provider, MD  simvastatin (ZOCOR) 40 MG tablet Take 40 mg by mouth daily at 6 PM.  07/07/14  Yes Historical Provider, MD  solifenacin (VESICARE) 5 MG tablet Take 1 tablet (5 mg total) by mouth daily. 09/14/15  Yes [name], MD  torsemide (DEMADEX) 20 MG tablet Take 20 mg by mouth daily.  10/03/13  Yes Historical Provider, MD  traMADol (ULTRAM) 50 MG tablet Take 50 mg by mouth every 6 (six) hours as needed.    Yes Historical Provider, MD  warfarin (COUMADIN) 3 MG tablet Take 3 mg by mouth daily at 6 PM.  05/02/14  Yes Historical Provider, MD  doxycycline (VIBRAMYCIN) 100 MG capsule Take 100 mg by mouth. 03/30/15   Historical Provider, MD  Investigational - Study Medication Additional Study Details: Inject 1 each subcutaneously every fourteen days    Historical Provider, MD  pantoprazole (PROTONIX) 40 MG tablet Take by mouth. 12/29/14   Historical Provider, MD    Family History Family History  Problem Relation Age of Onset  . Tuberculosis Brother     Social History Social History  Substance Use Topics  . Smoking status: Former Smoker    Types: Pipe    Quit date: 07/30/2006  .  Smokeless tobacco: Never Used  . Alcohol use 2.4 oz/week    2 Glasses of wine, 2 Standard drinks or equivalent per week     Allergies   Morphine and related   Review of Systems Review of Systems  Constitutional: Negative for diaphoresis, fatigue and fever.  HENT: Negative for trouble swallowing.   Respiratory: Positive for shortness of breath. Negative for cough.   Cardiovascular: Positive for chest pain. Negative for palpitations, orthopnea, claudication, syncope, PND and near-syncope.  Gastrointestinal: Negative for abdominal pain, anorexia, heartburn, nausea and vomiting.  Musculoskeletal: Negative for back pain.  Neurological: Negative for dizziness, weakness, numbness and headaches.     Physical Exam Triage Vital Signs ED Triage Vitals  Enc Vitals Group     BP 12/07/15 1013 128/80     Pulse Rate 12/07/15 1013 68     Resp 12/07/15 1013 16  Temp 12/07/15 1013 97.8 F (36.6 C)     Temp Source 12/07/15 1013 Oral     SpO2 12/07/15 1013 99 %     Weight 12/07/15 1014 197 lb (89.4 kg)     Height 12/07/15 1014 5\' 7"  (1.702 m)     Head Circumference --      Peak Flow --      Pain Score 12/07/15 1022 0     Pain Loc --      Pain Edu? --      Excl. in [place]? --    No data found.   Updated Vital Signs BP 128/80 (BP Location: Left Arm)   Pulse 68   Temp 97.8 F (36.6 C) (Oral)   Resp 16   Ht 5\' 7"  (1.702 m)   Wt 197 lb (89.4 kg)   SpO2 99%   BMI 30.85 kg/m   Visual Acuity Right Eye Distance:   Left Eye Distance:   Bilateral Distance:    Right Eye Near:   Left Eye Near:    Bilateral Near:     Physical Exam  Constitutional: He appears well-developed and well-nourished. No distress.  HENT:  Head: Normocephalic and atraumatic.  Right Ear: Tympanic membrane, external ear and ear canal normal.  Left Ear: Tympanic membrane, external ear and ear canal normal.  Nose: Nose normal.  Mouth/Throat: Uvula is midline, oropharynx is clear and moist and mucous membranes  are normal. No oropharyngeal exudate or tonsillar abscesses.  Eyes: Conjunctivae and EOM are normal. Pupils are equal, round, and reactive to light. Right eye exhibits no discharge. Left eye exhibits no discharge. No scleral icterus.  Neck: Normal range of motion. Neck supple. No tracheal deviation present. No thyromegaly present.  Cardiovascular: Normal rate, regular rhythm and normal heart sounds.   Pulmonary/Chest: Effort normal and breath sounds normal. No stridor. No respiratory distress. He has no wheezes. He has no rales. He exhibits tenderness (reproducible right lateral upper chest wall tenderness to palpation; no step off, erythema or skin lesion).  Lymphadenopathy:    He has no cervical adenopathy.  Neurological: He is alert.  Skin: Skin is warm and dry. No rash noted. He is not diaphoretic.  Nursing note and vitals reviewed.    UC Treatments / Results  Labs (all labs ordered are listed, but only abnormal results are displayed) Labs Reviewed - No data to display  EKG  EKG Interpretation None       Radiology No results found.  Procedures Procedures (including critical care time)  Medications Ordered in UC Medications - No data to display   Initial Impression / Assessment and Plan / UC Course  I have reviewed the triage vital signs and the nursing notes.  Pertinent labs & imaging results that were available during my care of the patient were reviewed by me and considered in my medical decision making (see chart for details).  Clinical Course     Final Clinical Impressions(s) / UC Diagnoses   Final diagnoses:  Acute costochondritis    New Prescriptions Discharge Medication List as of 12/07/2015 10:56 AM     1. diagnosis reviewed with patient 2. Recommend supportive treatment with otc tylenol prn, rest, ice 3. Follow-up prn if symptoms worsen or don't improve   Norval Gable, MD 12/07/15 1122

## 2015-12-09 ENCOUNTER — Telehealth: Payer: Self-pay | Admitting: Emergency Medicine

## 2015-12-10 NOTE — Telephone Encounter (Signed)
Patient called and left a message that he is feeling better and did not need any else at this moment.

## 2016-03-31 ENCOUNTER — Telehealth: Payer: Self-pay | Admitting: Urology

## 2016-03-31 ENCOUNTER — Other Ambulatory Visit: Payer: Self-pay

## 2016-03-31 DIAGNOSIS — N210 Calculus in bladder: Secondary | ICD-10-CM

## 2016-03-31 MED ORDER — SOLIFENACIN SUCCINATE 5 MG PO TABS: 5.0000 mg | ORAL_TABLET | Freq: Every day | ORAL | 2 refills | Status: DC

## 2016-03-31 NOTE — Telephone Encounter (Signed)
Refills sent to pharmacy. 

## 2016-03-31 NOTE — Telephone Encounter (Signed)
Pt called and wants a refill to [facility].

## 2016-09-22 ENCOUNTER — Ambulatory Visit
Admission: RE | Admit: 2016-09-22 | Discharge: 2016-09-22 | Disposition: A | Discharge status: Home / Self Care | Payer: Medicare PPO | Source: Ambulatory Visit | Attending: Urology | Admitting: Urology

## 2016-09-22 ENCOUNTER — Ambulatory Visit: Payer: Medicare PPO | Admitting: Urology

## 2016-09-22 ENCOUNTER — Encounter: Payer: Self-pay | Admitting: Urology

## 2016-09-22 VITALS — BP 94/52 | HR 80 | Ht 67.0 in | Wt 211.1 lb

## 2016-09-22 DIAGNOSIS — N400 Enlarged prostate without lower urinary tract symptoms: Secondary | ICD-10-CM

## 2016-09-22 DIAGNOSIS — N200 Calculus of kidney: Secondary | ICD-10-CM | POA: Diagnosis not present

## 2016-09-22 DIAGNOSIS — R935 Abnormal findings on diagnostic imaging of other abdominal regions, including retroperitoneum: Secondary | ICD-10-CM | POA: Diagnosis not present

## 2016-09-22 NOTE — Progress Notes (Signed)
09/22/2016 2:22 PM   Carlos [name] October 30, 1926 [id number]  Referring provider: [name], [address] [place], [place] [address]  Chief Complaint  Patient presents with  . Nephrolithiasis    HPI: The patient is a 83 year old old male who Presents for annual follow-up.   1. Nephrolithiasis History of two 5 to 7 mm right lower pole stones are unable to be reached during cystolitholapaxy for a bladder stone in October 2016 due to the inability to intubate the right ureteral orifice. He presents today for annual KUB to monitor size. There remained unchanged in size on KUB today.  2. History of bladder stones s/p cystolithopaxy His stone report showed 80% uric acid, calcium phosphate 3%, and calcium oxalate monohydrate 17%.  3. BPH On flomax. Symptoms well controlled. Nocturia 1-2. Good stream. Feels like he empties his bladder. No urinary complaints. No hematuria. He is very happy with his urinary quality of life.     PMH: Past Medical History:  Diagnosis Date  . Arthritis   . Atrial fibrillation ([place])   . CHF (congestive heart failure) ([place])   . Chronic kidney disease    kidney stones  . Coronary artery disease   . Diabetes mellitus without complication ([place])   . Diverticulosis   . GERD (gastroesophageal reflux disease)   . Gout   . Hyperlipidemia   . Hypertension   . Myocardial infarction ([place]) 2001  . Peripheral vascular disease ([place])   . Pneumothorax, left 1950    Surgical History: Past Surgical History:  Procedure Laterality Date  . CARDIAC CATHETERIZATION    . CARDIAC SURGERY     4 bypass  . CHOLECYSTECTOMY    . CORONARY ARTERY BYPASS GRAFT    . CYSTOSCOPY W/ RETROGRADES N/A 12/03/2014   Procedure: CYSTOSCOPY WITH ATTEMPT FOR RETROGRADE PYELOGRAM;  Surgeon: [name], MD;  Location: [facility];  Service: Urology;  Laterality: N/A;  . CYSTOSCOPY WITH LITHOLAPAXY N/A 12/03/2014   Procedure: CYSTOSCOPY WITH LITHOLAPAXY WITH HOLMIUM LASER  ;  Surgeon: [name], MD;  Location: [facility];  Service: Urology;  Laterality: N/A;  . FEMUR SURGERY    . GALLBLADDER SURGERY    . HERNIA REPAIR    . JOINT REPLACEMENT Right    Total Knee Replacement  . LUNG SURGERY Left   . REPLACEMENT TOTAL KNEE      Home Medications:  Allergies as of 09/22/2016      Reactions   Morphine And Related Other (See Comments)   "nightmares"      Medication List       Accurate as of 09/22/16  2:22 PM. Always use your most recent med list.          allopurinol 300 MG tablet Commonly known as:  ZYLOPRIM Take 150 mg by mouth daily.   aspirin EC 81 MG tablet Take 81 mg by mouth daily.   atenolol 25 MG tablet Commonly known as:  TENORMIN TAKE 1 TABLET ONE TIME DAILY FOR BLOOD PRESSURE   colchicine 0.6 MG tablet Take 0.6 mg by mouth as needed.   doxycycline 100 MG capsule Commonly known as:  VIBRAMYCIN Take 100 mg by mouth.   HYDROcodone-acetaminophen 5-325 MG tablet Commonly known as:  NORCO Take 1 tablet by mouth every 6 (six) hours as needed for moderate pain.   Investigational - Study Medication Additional Study Details: Inject 1 each subcutaneously every fourteen days   MULTI-VITAMINS Tabs Take 1 tablet by mouth daily.   omeprazole 20  MG capsule Commonly known as:  PRILOSEC Take 20 mg by mouth daily.   pantoprazole 40 MG tablet Commonly known as:  PROTONIX Take by mouth.   simvastatin 40 MG tablet Commonly known as:  ZOCOR Take 40 mg by mouth daily at 6 PM.   solifenacin 5 MG tablet Commonly known as:  VESICARE Take 1 tablet (5 mg total) by mouth daily.   torsemide 20 MG tablet Commonly known as:  DEMADEX Take 20 mg by mouth daily.   traMADol 50 MG tablet Commonly known as:  ULTRAM Take 50 mg by mouth every 6 (six) hours as needed.   warfarin 3 MG tablet Commonly known as:  COUMADIN Take 3 mg by mouth daily at 6 PM.       Allergies:  Allergies  Allergen Reactions  . Morphine And Related Other  (See Comments)    "nightmares"    Family History: Family History  Problem Relation Age of Onset  . Tuberculosis Brother     Social History:  reports that he quit smoking about 10 years ago. His smoking use included Pipe. He has never used smokeless tobacco. He reports that he drinks about 2.4 oz of alcohol per week . He reports that he does not use drugs.  ROS: UROLOGY Frequent Urination?: No Hard to postpone urination?: No Burning/pain with urination?: No Get up at night to urinate?: No Leakage of urine?: No Urine stream starts and stops?: No Trouble starting stream?: No Do you have to strain to urinate?: No Blood in urine?: No Urinary tract infection?: No Sexually transmitted disease?: No Injury to kidneys or bladder?: No Painful intercourse?: No Weak stream?: No Erection problems?: No Penile pain?: No  Gastrointestinal Nausea?: No Vomiting?: No Indigestion/heartburn?: No Diarrhea?: No Constipation?: No  Constitutional Fever: No Night sweats?: No Weight loss?: No Fatigue?: No  Skin Skin rash/lesions?: No Itching?: No  Eyes Blurred vision?: No Double vision?: No  Ears/Nose/Throat Sore throat?: No Sinus problems?: No  Hematologic/Lymphatic Swollen glands?: No Easy bruising?: No  Cardiovascular Leg swelling?: No Chest pain?: No  Respiratory Cough?: No Shortness of breath?: No  Endocrine Excessive thirst?: No  Musculoskeletal Back pain?: No Joint pain?: No  Neurological Headaches?: No Dizziness?: No  Psychologic Depression?: No Anxiety?: No  Physical Exam: BP (!) 94/52 (BP Location: Left Arm, Patient Position: Sitting, Cuff Size: Normal)   Pulse 80   Ht 5\' 7"  (1.702 m)   Wt 211 lb 1.6 oz (95.8 kg)   BMI 33.06 kg/m   Constitutional:  Alert and oriented, No acute distress. HEENT: [place] AT, moist mucus membranes.  Trachea midline, no masses. Cardiovascular: No clubbing, cyanosis, or edema. Respiratory: Normal respiratory effort, no  increased work of breathing. GI: Abdomen is soft, nontender, nondistended, no abdominal masses GU: No CVA tenderness.  Skin: No rashes, bruises or suspicious lesions. Lymph: No cervical or inguinal adenopathy. Neurologic: Grossly intact, no focal deficits, moving all 4 extremities. Psychiatric: Normal mood and affect.  Laboratory Data: Lab Results  Component Value Date   WBC 8.2 12/24/2013   HGB 9.3 (L) 12/24/2013   HCT 28.8 (L) 12/24/2013   MCV 93 12/24/2013   PLT 368 12/24/2013    Lab Results  Component Value Date   CREATININE 1.89 (H) 11/20/2014    No results found for: PSA  No results found for: TESTOSTERONE  No results found for: HGBA1C  Urinalysis    Component Value Date/Time   COLORURINE Yellow 12/12/2013 1512   APPEARANCEUR Clear 03/26/2015 1132   LABSPEC  1.006 12/12/2013 1512   PHURINE 5.0 12/12/2013 1512   GLUCOSEU Negative 03/26/2015 1132   GLUCOSEU Negative 12/12/2013 1512   HGBUR Negative 12/12/2013 1512   BILIRUBINUR Negative 03/26/2015 1132   BILIRUBINUR Negative 12/12/2013 1512   KETONESUR Negative 12/12/2013 1512   PROTEINUR Negative 03/26/2015 1132   PROTEINUR Negative 12/12/2013 1512   NITRITE Negative 03/26/2015 1132   NITRITE Negative 12/12/2013 1512   LEUKOCYTESUR Trace (A) 03/26/2015 1132   LEUKOCYTESUR Trace 12/12/2013 1512    Pertinent Imaging: KUB reviewed as above  Assessment & Plan:    1. Nephrolithiasis Stable 7 mm and 5 mm right lower pole stones. Continue active surveillance with KUB in one year  2. BPH -continue flomax  3. History of bladder stone This has been surgically corrected. No further intervention at this time.  Return in about 1 year (around 09/22/2017) for KUB prior.  [name], MD  [facility] [address] [place], [place] [address] [phone]

## 2017-01-20 ENCOUNTER — Ambulatory Visit
Admission: EM | Admit: 2017-01-20 | Discharge: 2017-01-20 | Disposition: A | Discharge status: Home / Self Care | Payer: Medicare PPO | Attending: Family Medicine | Admitting: Family Medicine

## 2017-01-20 ENCOUNTER — Encounter: Payer: Self-pay | Admitting: *Deleted

## 2017-01-20 DIAGNOSIS — L03116 Cellulitis of left lower limb: Secondary | ICD-10-CM | POA: Diagnosis not present

## 2017-01-20 DIAGNOSIS — S81802A Unspecified open wound, left lower leg, initial encounter: Secondary | ICD-10-CM

## 2017-01-20 MED ORDER — DOXYCYCLINE HYCLATE 100 MG PO TABS: 100.0000 mg | ORAL_TABLET | Freq: Two times a day (BID) | ORAL | 0 refills | Status: DC

## 2017-01-20 NOTE — ED Provider Notes (Signed)
[facility]    CSN: [id number] Arrival date & time: 01/20/17  1110     History   Chief Complaint Chief Complaint  Patient presents with  . Leg Injury    HPI Carlos [name] is a 83 y.o. male.   83 yo male with a 2 week h/o left lower leg skin injury after dropping a book that hit his leg and scraped his skin. States he subsequently developed a blister which he was treating topically and eventually opened up about 1 week ago. Since then he's noticed spreading redness, drainage and pain. Denies any fevers, chills.    The history is provided by the patient.    Past Medical History:  Diagnosis Date  . Arthritis   . Atrial fibrillation ([place])   . CHF (congestive heart failure) ([place])   . Chronic kidney disease    kidney stones  . Coronary artery disease   . Diabetes mellitus without complication ([place])   . Diverticulosis   . GERD (gastroesophageal reflux disease)   . Gout   . Hyperlipidemia   . Hypertension   . Myocardial infarction ([place]) 2001  . Peripheral vascular disease ([place])   . Pneumothorax, left 1950    Patient Active Problem List   Diagnosis Date Noted  . Acute pain of left shoulder 09/08/2015  . CKD (chronic kidney disease), stage IV ([place]) 12/24/2014  . Bradycardia 12/02/2014  . TI (tricuspid incompetence) 10/21/2014  . DD (diverticular disease) 07/30/2014  . Essential (primary) hypertension 07/30/2014  . Tuberculosis 07/30/2014  . Hyperlipidemia 07/30/2014  . Chronic systolic CHF (congestive heart failure), NYHA class 2 ([place]) 02/12/2014  . AF (paroxysmal atrial fibrillation) ([place]) 02/12/2014  . Paroxysmal A-fib ([place]) 02/12/2014  . Accumulation of fluid in tissues 07/24/2013  . Acid reflux 07/24/2013  . Gout 07/24/2013  . Diabetes mellitus, type 2 ([place]) 07/24/2013  . Type 2 diabetes mellitus with renal manifestations, controlled ([place]) 07/24/2013  . Peripheral blood vessel disorder ([place]) 07/19/2013  . PVD (peripheral vascular disease) ([place])  07/19/2013  . Combined fat and carbohydrate induced hyperlipemia 05/21/2013  . Encounter for therapeutic drug monitoring 12/18/2012  . Chronic kidney disease (CKD), stage IV (severe) ([place]) 12/18/2012  . Arteriosclerosis of coronary artery 12/18/2012  . Decreased potassium in the blood 12/18/2012  . H/O coronary artery bypass surgery 12/18/2012  . H/O total knee replacement 12/18/2012  . Encounter for therapeutic drug level monitoring 12/18/2012    Past Surgical History:  Procedure Laterality Date  . CARDIAC CATHETERIZATION    . CARDIAC SURGERY     4 bypass  . CHOLECYSTECTOMY    . CORONARY ARTERY BYPASS GRAFT    . CYSTOSCOPY W/ RETROGRADES N/A 12/03/2014   Procedure: CYSTOSCOPY WITH ATTEMPT FOR RETROGRADE PYELOGRAM;  Surgeon: [name], MD;  Location: [facility] ORS;  Service: Urology;  Laterality: N/A;  . CYSTOSCOPY WITH LITHOLAPAXY N/A 12/03/2014   Procedure: CYSTOSCOPY WITH LITHOLAPAXY WITH HOLMIUM LASER ;  Surgeon: [name], MD;  Location: [facility] ORS;  Service: Urology;  Laterality: N/A;  . FEMUR SURGERY    . GALLBLADDER SURGERY    . HERNIA REPAIR    . JOINT REPLACEMENT Right    Total Knee Replacement  . LUNG SURGERY Left   . REPLACEMENT TOTAL KNEE         Home Medications    Prior to Admission medications   Medication Sig Start Date End Date Taking? Authorizing Provider  allopurinol (ZYLOPRIM) 300 MG tablet Take 150 mg by mouth daily.  05/09/14  Yes [provider]  aspirin EC 81 MG tablet Take 81 mg by mouth daily.    Yes [provider]  atenolol (TENORMIN) 25 MG tablet TAKE 1 TABLET ONE TIME DAILY FOR BLOOD PRESSURE 01/21/14  Yes [provider]  colchicine 0.6 MG tablet Take 0.6 mg by mouth as needed.    Yes [provider]  Multiple Vitamin (MULTI-VITAMINS) TABS Take 1 tablet by mouth daily.    Yes [provider]  omeprazole (PRILOSEC) 20 MG capsule Take 20 mg by mouth daily.  05/19/14  Yes [provider]  simvastatin (ZOCOR) 40 MG tablet Take 40 mg by mouth daily at 6 PM.  07/07/14  Yes [provider]  solifenacin (VESICARE) 5 MG tablet Take 1 tablet (5 mg total) by mouth daily. 03/31/16  Yes [name], MD  torsemide (DEMADEX) 20 MG tablet Take 20 mg by mouth daily.  10/03/13  Yes [provider]  warfarin (COUMADIN) 3 MG tablet Take 3 mg by mouth daily at 6 PM.  05/02/14  Yes [provider]  doxycycline (VIBRA-TABS) 100 MG tablet Take 1 tablet (100 mg total) by mouth 2 (two) times daily. 01/20/17   Norval Gable, MD  HYDROcodone-acetaminophen (NORCO) 5-325 MG tablet Take 1 tablet by mouth every 6 (six) hours as needed for moderate pain. Patient not taking: Reported on 09/22/2016 12/03/14   [name], MD  Investigational - Study Medication Additional Study Details: Inject 1 each subcutaneously every fourteen days    [provider]  pantoprazole (PROTONIX) 40 MG tablet Take by mouth. 12/29/14   [provider]  traMADol (ULTRAM) 50 MG tablet Take 50 mg by mouth every 6 (six) hours as needed.     [provider]    Family History Family History  Problem Relation Age of Onset  . Tuberculosis Brother     Social History Social History   Tobacco Use  . Smoking status: Former Smoker    Types: Pipe    Last attempt to quit: 07/30/2006    Years since quitting: 10.4  . Smokeless tobacco: Never Used  Substance Use Topics  . Alcohol use: Yes    Alcohol/week: 2.4 oz    Types: 2 Glasses of wine, 2 Standard drinks or equivalent per week  . Drug use: No     Allergies   Morphine and related   Review of Systems Review of Systems   Physical Exam Triage Vital Signs ED Triage Vitals  Enc Vitals Group     BP 01/20/17 1147 (!) 118/56     Pulse Rate 01/20/17 1147 65     Resp 01/20/17 1147 16     Temp 01/20/17 1147 97.8 F (36.6 C)     Temp Source 01/20/17 1147 Oral     SpO2 01/20/17 1147 95 %     Weight 01/20/17 1149  203 lb (92.1 kg)     Height 01/20/17 1149 5\' 7"  (1.702 m)     Head Circumference --      Peak Flow --      Pain Score 01/20/17 1150 5     Pain Loc --      Pain Edu? --      Excl. in [place]? --    No data found.  Updated Vital Signs BP (!) 118/56 (BP Location: Left Arm)   Pulse 65   Temp 97.8 F (36.6 C) (Oral)   Resp 16   Ht 5\' 7"  (1.702 m)  Wt 203 lb (92.1 kg)   SpO2 95%   BMI 31.79 kg/m   Visual Acuity Right Eye Distance:   Left Eye Distance:   Bilateral Distance:    Right Eye Near:   Left Eye Near:    Bilateral Near:     Physical Exam  Constitutional: He appears well-developed and well-nourished. No distress.  Skin: He is not diaphoretic. There is erythema.  Left shin skin with 3x3cm skin ulceration with surrounding leg skin blanchable erythema, warmth and tenderness to palpation  Nursing note and vitals reviewed.    UC Treatments / Results  Labs (all labs ordered are listed, but only abnormal results are displayed) Labs Reviewed - No data to display  EKG  EKG Interpretation None       Radiology No results found.  Procedures Procedures (including critical care time)  Medications Ordered in UC Medications - No data to display   Initial Impression / Assessment and Plan / UC Course  I have reviewed the triage vital signs and the nursing notes.  Pertinent labs & imaging results that were available during my care of the patient were reviewed by me and considered in my medical decision making (see chart for details).       Final Clinical Impressions(s) / UC Diagnoses   Final diagnoses:  Wound of left lower extremity, initial encounter  Cellulitis of leg, left    ED Discharge Orders        Ordered    doxycycline (VIBRA-TABS) 100 MG tablet  2 times daily     01/20/17 1230     1. diagnosis reviewed with patient 2. rx as per orders above; reviewed possible side effects, interactions, risks and benefits  3. Recommend supportive treatment with  rest, elevation, warm compresses to surrounding area 4. Follow-up in 3 days (after the weekend) with PCP for recheck and INR check  Controlled Substance Prescriptions [place] Controlled Substance Registry consulted? Not Applicable   Norval Gable, MD 01/20/17 [phone]

## 2017-01-20 NOTE — ED Triage Notes (Signed)
While in church 2 weeks ago, dropped a hymnal which struck his left anterior lower leg causing a wound which has become red and painful.

## 2017-03-07 ENCOUNTER — Encounter: Payer: Medicare PPO | Attending: Physician Assistant | Admitting: Physician Assistant

## 2017-03-07 DIAGNOSIS — Z885 Allergy status to narcotic agent status: Secondary | ICD-10-CM | POA: Diagnosis not present

## 2017-03-07 DIAGNOSIS — E669 Obesity, unspecified: Secondary | ICD-10-CM | POA: Diagnosis not present

## 2017-03-07 DIAGNOSIS — N186 End stage renal disease: Secondary | ICD-10-CM | POA: Diagnosis not present

## 2017-03-07 DIAGNOSIS — M109 Gout, unspecified: Secondary | ICD-10-CM | POA: Diagnosis not present

## 2017-03-07 DIAGNOSIS — I252 Old myocardial infarction: Secondary | ICD-10-CM | POA: Insufficient documentation

## 2017-03-07 DIAGNOSIS — Z87891 Personal history of nicotine dependence: Secondary | ICD-10-CM | POA: Diagnosis not present

## 2017-03-07 DIAGNOSIS — I2510 Atherosclerotic heart disease of native coronary artery without angina pectoris: Secondary | ICD-10-CM | POA: Insufficient documentation

## 2017-03-07 DIAGNOSIS — Z6833 Body mass index (BMI) 33.0-33.9, adult: Secondary | ICD-10-CM | POA: Diagnosis not present

## 2017-03-07 DIAGNOSIS — I120 Hypertensive chronic kidney disease with stage 5 chronic kidney disease or end stage renal disease: Secondary | ICD-10-CM | POA: Insufficient documentation

## 2017-03-07 DIAGNOSIS — L03116 Cellulitis of left lower limb: Secondary | ICD-10-CM | POA: Insufficient documentation

## 2017-03-07 DIAGNOSIS — E11622 Type 2 diabetes mellitus with other skin ulcer: Secondary | ICD-10-CM | POA: Diagnosis not present

## 2017-03-07 DIAGNOSIS — Z7901 Long term (current) use of anticoagulants: Secondary | ICD-10-CM | POA: Diagnosis not present

## 2017-03-07 DIAGNOSIS — L97828 Non-pressure chronic ulcer of other part of left lower leg with other specified severity: Secondary | ICD-10-CM | POA: Insufficient documentation

## 2017-03-07 DIAGNOSIS — M069 Rheumatoid arthritis, unspecified: Secondary | ICD-10-CM | POA: Diagnosis not present

## 2017-03-07 DIAGNOSIS — E1122 Type 2 diabetes mellitus with diabetic chronic kidney disease: Secondary | ICD-10-CM | POA: Diagnosis not present

## 2017-03-08 NOTE — Progress Notes (Addendum)
HAZAIAH, EDGECOMBE ([id number]) Visit Report for 03/07/2017 Allergy List Details Patient Name: Carlos [name], Carlos [name] Date of Service: 03/07/2017 9:15 AM Medical Record Number: [id number] Patient Account Number: [id number] Date of Birth/Sex: 1926-06-06 (84 y.o. Male) Treating RN: Roger Shelter Primary Care Vickie Ponds: Thereasa Distance Other Clinician: Referring Sansa Alkema: Alanson Aly Treating Kaiea Esselman/Extender: STONE [name], [name] Weeks in Treatment: 0 Allergies Active Allergies morphine Reaction: nightmares Allergy Notes Electronic Signature(s) Signed: 03/07/2017 2:51:30 PM By: Roger Shelter Entered By: Roger Shelter on 03/07/2017 09:38:36 Carlos [name] ([id number]) -------------------------------------------------------------------------------- Arrival Information Details Patient Name: Carlos [name] Date of Service: 03/07/2017 9:15 AM Medical Record Number: [id number] Patient Account Number: [id number] Date of Birth/Sex: Jan 23, 1927 (84 y.o. Male) Treating RN: Roger Shelter Primary Care Kirke Breach: Thereasa Distance Other Clinician: Referring Jonell Krontz: Alanson Aly Treating Adelynne Joerger/Extender: Melburn Hake, [name] Weeks in Treatment: 0 Visit Information Patient Arrived: [name] Time: 09:16 Accompanied By: self Transfer Assistance: None Patient Identification Verified: Yes Secondary Verification Process Completed: Yes Electronic Signature(s) Signed: 03/07/2017 2:51:30 PM By: Roger Shelter Entered By: Roger Shelter on 03/07/2017 09:17:00 Carlos [name] ([id number]) -------------------------------------------------------------------------------- Clinic Level of Care Assessment Details Patient Name: Carlos [name] Date of Service: 03/07/2017 9:15 AM Medical Record Number: [id number] Patient Account Number: [id number] Date of Birth/Sex: 1926/11/18 (84 y.o. Male) Treating RN: Roger Shelter Primary Care Terrika Zuver: Thereasa Distance Other Clinician: Referring Sairah Knobloch: Alanson Aly Treating  Briannah Lona/Extender: Melburn Hake, [name] Weeks in Treatment: 0 Clinic Level of Care Assessment Items TOOL 2 Quantity Score X - Use when only an EandM is performed on the INITIAL visit 1 0 ASSESSMENTS - Nursing Assessment / Reassessment X - General Physical Exam (combine w/ comprehensive assessment (listed just below) when 1 20 performed on new pt. evals) X- 1 25 Comprehensive Assessment (HX, ROS, Risk Assessments, Wounds Hx, etc.) ASSESSMENTS - Wound and Skin Assessment / Reassessment X - Simple Wound Assessment / Reassessment - one wound 1 5 []  - 0 Complex Wound Assessment / Reassessment - multiple wounds []  - 0 Dermatologic / Skin Assessment (not related to wound area) ASSESSMENTS - Ostomy and/or Continence Assessment and Care []  - Incontinence Assessment and Management 0 []  - 0 Ostomy Care Assessment and Management (repouching, etc.) PROCESS - Coordination of Care X - Simple Patient / Family Education for ongoing care 1 15 []  - 0 Complex (extensive) Patient / Family Education for ongoing care []  - 0 Staff obtains Programmer, systems, Records, Test Results / Process Orders []  - 0 Staff telephones HHA, Nursing Homes / Clarify orders / etc []  - 0 Routine Transfer to another Facility (non-emergent condition) []  - 0 Routine Hospital Admission (non-emergent condition) []  - 0 New Admissions / Biomedical engineer / Ordering NPWT, Apligraf, etc. []  - 0 Emergency Hospital Admission (emergent condition) X- 1 10 Simple Discharge Coordination []  - 0 Complex (extensive) Discharge Coordination PROCESS - Special Needs []  - Pediatric / Minor Patient Management 0 []  - 0 Isolation Patient Management Carlos [name], Carlos [name] ([id number]) []  - 0 Hearing / Language / Visual special needs []  - 0 Assessment of Community assistance (transportation, D/C planning, etc.) []  - 0 Additional assistance / Altered mentation []  - 0 Support Surface(s) Assessment (bed, cushion, seat, etc.) INTERVENTIONS - Wound  Cleansing / Measurement X - Wound Imaging (photographs - any number of wounds) 1 5 []  - 0 Wound Tracing (instead of photographs) X- 1 5 Simple Wound Measurement - one wound []  - 0 Complex Wound Measurement - multiple wounds X- 1 5 Simple Wound Cleansing - one wound []  - 0 Complex Wound Cleansing - multiple wounds INTERVENTIONS -  Wound Dressings X - Small Wound Dressing one or multiple wounds 1 10 []  - 0 Medium Wound Dressing one or multiple wounds []  - 0 Large Wound Dressing one or multiple wounds []  - 0 Application of Medications - injection INTERVENTIONS - Miscellaneous []  - External ear exam 0 []  - 0 Specimen Collection (cultures, biopsies, blood, body fluids, etc.) []  - 0 Specimen(s) / Culture(s) sent or taken to Lab for analysis []  - 0 Patient Transfer (multiple staff / Harrel Lemon Lift / Similar devices) []  - 0 Simple Staple / Suture removal (25 or less) []  - 0 Complex Staple / Suture removal (26 or more) []  - 0 Hypo / Hyperglycemic Management (close monitor of Blood Glucose) X- 1 15 Ankle / Brachial Index (ABI) - do not check if billed separately Has the patient been seen at the hospital within the last three years: Yes Total Score: 115 Level Of Care: New/Established - Level 3 Electronic Signature(s) Signed: 03/07/2017 2:51:30 PM By: Roger Shelter Entered By: Roger Shelter on 03/07/2017 10:41:44 Carlos [name] ([id number]) -------------------------------------------------------------------------------- Encounter Discharge Information Details Patient Name: Carlos [name] Date of Service: 03/07/2017 9:15 AM Medical Record Number: [id number] Patient Account Number: [id number] Date of Birth/Sex: 08-02-1926 (84 y.o. Male) Treating RN: Roger Shelter Primary Care Dorethy Tomey: Thereasa Distance Other Clinician: Referring Annalaura Sauseda: Alanson Aly Treating Pammy Vesey/Extender: Melburn Hake, [name] Weeks in Treatment: 0 Encounter Discharge Information Items Discharge Pain Level:  0 Discharge Condition: Stable Ambulatory Status: Cane Discharge Destination: Home Transportation: Private Auto Accompanied By: self Schedule Follow-up Appointment: No Medication Reconciliation completed and No provided to Patient/Care [place] [facility]: Provided on Clinical Summary of Care: 03/07/2017 Form Type Recipient Paper Patient SR Electronic Signature(s) Signed: 03/07/2017 2:51:30 PM By: Roger Shelter Entered By: Roger Shelter on 03/07/2017 10:42:48 Carlos [name] ([id number]) -------------------------------------------------------------------------------- Lower Extremity Assessment Details Patient Name: Carlos [name] Date of Service: 03/07/2017 9:15 AM Medical Record Number: [id number] Patient Account Number: [id number] Date of Birth/Sex: [birth year] (84 y.o. Male) Treating RN: Roger Shelter Primary Care Pate Aylward: Thereasa Distance Other Clinician: Referring Joydan Gretzinger: Alanson Aly Treating Vikas Wegmann/Extender: Melburn Hake, [name] Weeks in Treatment: 0 Edema Assessment Assessed: [Left: No] [Right: No] Edema: [Left: Ye] [Right: s] Calf Left: Right: Point of Measurement: 30 cm From Medial Instep 35.5 cm cm Ankle Left: Right: Point of Measurement: 11 cm From Medial Instep 23.6 cm cm Vascular Assessment Claudication: Claudication Assessment [Left:None] Pulses: Dorsalis Pedis Palpable: [Left:Yes] Doppler Audible: [Left:Yes] Posterior Tibial Extremity colors, hair growth, and conditions: Extremity Color: [Left:Normal] Hair Growth on Extremity: [Left:Yes] Temperature of Extremity: [Left:Cool] Capillary Refill: [Left:< 3 seconds] Blood Pressure: Brachial: [Left:110] Dorsalis Pedis: 110 [Left:Dorsalis Pedis:] Ankle: Posterior Tibial: 120 [Left:Posterior Tibial: 1.09] Toe Nail Assessment Left: Right: Thick: Yes Discolored: Yes Deformed: Yes Improper Length and Hygiene: Yes Electronic Signature(s) Signed: 03/07/2017 2:51:30 PM By: Roger Shelter Entered By: Roger Shelter on 03/07/2017 09:37:17 Carlos [name] ([id number]) Carlos [name] ([id number]) -------------------------------------------------------------------------------- Pain Assessment Details Patient Name: Carlos [name] Date of Service: 03/07/2017 9:15 AM Medical Record Number: [id number] Patient Account Number: [id number] Date of Birth/Sex: May 04, 1926 (84 y.o. Male) Treating RN: Roger Shelter Primary Care Aivan Fillingim: Thereasa Distance Other Clinician: Referring Enrica Corliss: Alanson Aly Treating Seerat Peaden/Extender: STONE [name], [name] Weeks in Treatment: 0 Active Problems Location of Pain Severity and Description of Pain Patient Has Paino No Site Locations Pain Management and Medication Current Pain Management: Electronic Signature(s) Signed: 03/07/2017 2:51:30 PM By: Roger Shelter Entered By: Roger Shelter on 03/07/2017 09:18:52 Carlos [name] ([id number]) -------------------------------------------------------------------------------- Patient/Caregiver Education Details Patient Name: Carlos [name] Date of Service: 03/07/2017 9:15 AM Medical  Record Number: [id number] Patient Account Number: [id number] Date of Birth/Gender: [birth year] (84 y.o. Male) Treating RN: Roger Shelter Primary Care Physician: Thereasa Distance Other Clinician: Referring Physician: Alanson Aly Treating Physician/Extender: Sharalyn Ink in Treatment: 0 Education Assessment Education Provided To: Patient Education Topics Provided Welcome To [facility]: Handouts: Welcome To [facility] Methods: Explain/Verbal Responses: State content correctly Wound/Skin Impairment: Handouts: Caring for Your Ulcer Methods: Explain/Verbal Responses: State content correctly Electronic Signature(s) Signed: 03/07/2017 2:51:30 PM By: Roger Shelter Entered By: Roger Shelter on 03/07/2017 10:43:08 Carlos [name]  ([id number]) -------------------------------------------------------------------------------- Wound Assessment Details Patient Name: Carlos [name] Date of Service: 03/07/2017 9:15 AM Medical Record Number: [id number] Patient Account Number: [id number] Date of Birth/Sex: 05/27/1926 (84 y.o. Male) Treating RN: Roger Shelter Primary Care Sabella Traore: Thereasa Distance Other Clinician: Referring Kaeo Jacome: Alanson Aly Treating Mata Rowen/Extender: Melburn Hake, [name] Weeks in Treatment: 0 Wound Status Wound Number: 1 Primary Trauma, Other Etiology: Wound Location: Left Lower Leg - Midline Wound Open Wounding Event: Trauma Status: Date Acquired: 02/14/2017 Comorbid Cataracts, Pneumothorax, Tuberculosis, Weeks Of Treatment: 0 History: Arrhythmia, Coronary Artery Disease, Clustered Wound: No Hypertension, Myocardial Infarction, End Stage Renal Disease, Gout, Rheumatoid Arthritis Photos Photo Uploaded By: Roger Shelter on 03/07/2017 10:58:12 Wound Measurements Length: (cm) 0.1 % Reduct Width: (cm) 0.1 % Reduct Depth: (cm) 0.1 Epitheli Area: (cm) 0.008 Tunneli Volume: (cm) 0.001 Undermi ion in Area: 0% ion in Volume: 0% alization: Large (67-100%) ng: No ning: No Wound Description Classification: Partial Thickness Wound Margin: Distinct, outline attached Exudate Amount: None Present Foul Odor After Cleansing: No Slough/Fibrino Yes Wound Bed Granulation Amount: None Present (0%) Exposed Structure Necrotic Amount: Large (67-100%) Fascia Exposed: No Necrotic Quality: Eschar Fat Layer (Subcutaneous Tissue) Exposed: No Tendon Exposed: No Muscle Exposed: No Joint Exposed: No Bone Exposed: No Periwound Skin Texture Texture Color Newey, Carlos [name] ([id number]) No Abnormalities Noted: No No Abnormalities Noted: No Callus: No Atrophie Blanche: No Crepitus: No Cyanosis: No Excoriation: No Ecchymosis: No Induration: No Erythema: No Rash: No Hemosiderin Staining: No Scarring:  No Mottled: No Pallor: No Moisture Rubor: No No Abnormalities Noted: No Dry / Scaly: No Temperature / Pain Maceration: No Temperature: No Abnormality Tenderness on Palpation: Yes Wound Preparation Ulcer Cleansing: Rinsed/Irrigated with Saline Topical Anesthetic Applied: Other: lidocaine 4%, Assessment Notes once scab removed by Carlos [name] from wound, skin is healed under scab. Treatment Notes Wound #1 (Left, Midline Lower Leg) 1. Cleansed with: Clean wound with Normal Saline 2. Anesthetic Topical Lidocaine 4% cream to wound bed prior to debridement 3. Peri-wound Care: Skin Prep 5. Secondary Dressing Applied Bordered Foam Dressing Electronic Signature(s) Signed: 03/07/2017 2:51:30 PM By: Roger Shelter Entered By: Roger Shelter on 03/07/2017 10:44:51 Carlos [name] ([id number]) -------------------------------------------------------------------------------- Vitals Details Patient Name: Carlos [name] Date of Service: 03/07/2017 9:15 AM Medical Record Number: [id number] Patient Account Number: [id number] Date of Birth/Sex: [birth year] (84 y.o. Male) Treating RN: Roger Shelter Primary Care Milburn Freeney: Thereasa Distance Other Clinician: Referring Mervin Ramires: Alanson Aly Treating Kaliah Haddaway/Extender: Melburn Hake, [name] Weeks in Treatment: 0 Vital Signs Time Taken: 09:18 Temperature (F): 97.8 Height (in): 67 Pulse (bpm): 66 Source: Stated Respiratory Rate (breaths/min): 18 Weight (lbs): 212 Blood Pressure (mmHg): 123/56 Source: Measured Reference Range: 80 - 120 mg / dl Body Mass Index (BMI): 33.2 Electronic Signature(s) Signed: 03/07/2017 2:51:30 PM By: Roger Shelter Entered By: Roger Shelter on 03/07/2017 09:20:30

## 2017-03-08 NOTE — Progress Notes (Signed)
[name], [name] ([id number]) Visit Report for 03/07/2017 Chief Complaint Document Details Patient Name: Carlos [name], Carlos [name] Date of Service: 03/07/2017 9:15 AM Medical Record Number: [id number] Patient Account Number: [id number] Date of Birth/Sex: 05/13/1926 (84 y.o. Male) Treating RN: Roger Shelter Primary Care Provider: Thereasa Distance Other Clinician: Referring Provider: Alanson Aly Treating Provider/Extender: Melburn Hake, Nyomi Howser Weeks in Treatment: 0 Information Obtained from: Patient Chief Complaint Left anterior shin ulcer Electronic Signature(s) Signed: 03/08/2017 8:32:41 AM By: Worthy Keeler PA-C Entered By: Worthy Keeler on 03/07/2017 23:35:38 Carlos [name] ([id number]) -------------------------------------------------------------------------------- HPI Details Patient Name: Carlos [name] Date of Service: 03/07/2017 9:15 AM Medical Record Number: [id number] Patient Account Number: [id number] Date of Birth/Sex: 10-14-1926 (84 y.o. Male) Treating RN: Roger Shelter Primary Care Provider: Thereasa Distance Other Clinician: Referring Provider: Alanson Aly Treating Provider/Extender: Melburn Hake, Homer Miller Weeks in Treatment: 0 History of Present Illness Associated Signs and Symptoms: Diabetes mellitus type II hypertension, and chronic anticoagulant therapy HPI Description: 03/07/17 on evaluation today patient presents for initial evaluation concerning interior portion of his left leg which he has had for about one month. He did have some infection and inflammation noted initially he was placed on Ceftin to begin with and then switch to doxycycline and the infection did not seem to clear. This did eventually completely clear right now he has a small eschar area over the left anterior shin which is still present but there does not appear to be any bleeding, drainage, or otherwise infection noted at this point. Last note I have for review from his primary care provider at [facility] is from 02/17/17 and  this is when he was placed on the doxycycline he is not having any discomfort at this point which is excellent news. Electronic Signature(s) Signed: 03/08/2017 8:32:41 AM By: Worthy Keeler PA-C Entered By: Worthy Keeler on 03/07/2017 23:46:14 Carlos [name] ([id number]) -------------------------------------------------------------------------------- Physical Exam Details Patient Name: Carlos [name] Date of Service: 03/07/2017 9:15 AM Medical Record Number: [id number] Patient Account Number: [id number] Date of Birth/Sex: 01-Aug-1926 (84 y.o. Male) Treating RN: Roger Shelter Primary Care Provider: Thereasa Distance Other Clinician: Referring Provider: Alanson Aly Treating Provider/Extender: STONE III, Dezaria Methot Weeks in Treatment: 0 Constitutional sitting or standing blood pressure is within target range for patient.. pulse regular and within target range for patient.Marland Kitchen respirations regular, non-labored and within target range for patient.Marland Kitchen temperature within target range for patient.. Obese and well-hydrated in no acute distress. Eyes conjunctiva clear no eyelid edema noted. pupils equal round and reactive to light and accommodation. Ears, Nose, Mouth, and Throat no gross abnormality of ear auricles or external auditory canals. normal hearing noted during conversation. mucus membranes moist. Respiratory normal breathing without difficulty. clear to auscultation bilaterally. Cardiovascular regular rate and rhythm with normal S1, S2. 2+ dorsalis pedis/posterior tibialis pulses. 1+ pitting edema of the bilateral lower extremities. Gastrointestinal (GI) soft, non-tender, non-distended, +BS. no ventral hernia noted. Musculoskeletal normal gait and posture. no significant deformity or arthritic changes, no loss or range of motion, no clubbing. Psychiatric this patient is able to make decisions and demonstrates good insight into disease process. Alert and Oriented x 3. pleasant and  cooperative. Notes Patient's wound showed an eschar area over the left anterior shin. However once we coded this with lidocaine and left it for several minutes and then came back to evaluate the eschar came off without any use of instruments the wound bed underneath appear to be completely healed. There does not appear to be any residual opening and no evidence of drainage. Obviously  this was good news. The area does appear to be newly healed and will need to be protected for a couple weeks in my opinion to prevent any additional injury. Electronic Signature(s) Signed: 03/08/2017 8:32:41 AM By: Worthy Keeler PA-C Entered By: Worthy Keeler on 03/07/2017 23:48:23 Carlos [name] ([id number]) -------------------------------------------------------------------------------- Physician Orders Details Patient Name: Carlos [name] Date of Service: 03/07/2017 9:15 AM Medical Record Number: [id number] Patient Account Number: [id number] Date of Birth/Sex: [birth year] (84 y.o. Male) Treating RN: Roger Shelter Primary Care Provider: Thereasa Distance Other Clinician: Referring Provider: Alanson Aly Treating Provider/Extender: Melburn Hake, Athziri Freundlich Weeks in Treatment: 0 Verbal / Phone Orders: No Diagnosis Coding Wound Cleansing Wound #1 Left,Midline Lower Leg o Clean wound with Normal Saline. Anesthetic (add to Medication List) Wound #1 Left,Midline Lower Leg o Topical Lidocaine 4% cream applied to wound bed prior to debridement (In Clinic Only). Primary Wound Dressing o Other: - cavilon skin barrier prep Secondary Dressing Wound #1 Left,Midline Lower Leg o Boardered Foam Dressing Dressing Change Frequency Wound #1 Left,Midline Lower Leg o Three times weekly Follow-up Appointments Wound #1 Left,Midline Lower Leg o Other: - patient to call if needed Electronic Signature(s) Signed: 03/07/2017 2:51:30 PM By: Roger Shelter Signed: 03/08/2017 8:32:41 AM By: Worthy Keeler PA-C Entered By:  Roger Shelter on 03/07/2017 10:41:10 Carlos [name] ([id number]) -------------------------------------------------------------------------------- Problem List Details Patient Name: Carlos [name] Date of Service: 03/07/2017 9:15 AM Medical Record Number: [id number] Patient Account Number: [id number] Date of Birth/Sex: June 21, 1926 (84 y.o. 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 Male) Treating RN: Roger Shelter Primary Care Provider: Thereasa Distance Other Clinician: Referring Provider: Alanson Aly Treating Provider/Extender: Melburn Hake, Bretton Tandy Weeks in Treatment: 0 Subjective Chief Complaint Information obtained from Patient Left anterior shin ulcer History of Present Illness (HPI) The following HPI elements were documented for the patient's wound: Associated Signs and Symptoms: Diabetes mellitus type II hypertension, and chronic anticoagulant therapy 03/07/17 on evaluation today patient presents for initial  evaluation concerning interior portion of his left leg which he has had for about one month. He did have some infection and inflammation noted initially he was placed on Ceftin to begin with and then switch to doxycycline and the infection did not seem to clear. This did eventually completely clear right now he has a small eschar area over the left anterior shin which is still present but there does not appear to be any bleeding, drainage, or otherwise infection noted at this point. Last note I have for review from his primary care provider at [facility] is from 02/17/17 and this is when he was placed on the doxycycline he is not having any discomfort at this point which is excellent news. Wound History Patient presents with 1 open wound that has been present for approximately 4  weeks. Patient has been treating wound in the following manner: zinc cream and completed antibiotics. Laboratory tests have been performed in the last month. Patient reportedly has not tested positive for an antibiotic resistant organism. Patient reportedly has not tested positive for osteomyelitis. Patient reportedly has not had testing performed to evaluate circulation in the legs. Patient History Information obtained from Patient. Allergies morphine (Reaction: nightmares) Family History Diabetes - Mother, Lung Disease - Siblings, Tuberculosis - Siblings, No family history of Cancer, Heart Disease, Hypertension, Kidney Disease, Seizures, Stroke, Thyroid Problems. Social History Former smoker - pipe smoker stooped 2001, Marital Status - Widowed, Alcohol Use - Daily, Drug Use - No History, Caffeine Use - Daily. 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 Denies history of Congestive Heart Failure, Deep Vein Thrombosis, Hypotension, Peripheral Arterial Disease, Peripheral Venous Disease, Phlebitis, Vasculitis Gastrointestinal Denies history of Cirrhosis , Colitis, Crohn s, Hepatitis A, Hepatitis B, Hepatitis C Endocrine Denies history of Type I Diabetes, Type II Diabetes Genitourinary Patient has history of End Stage Renal Disease Immunological Denies history of Lupus Erythematosus, Raynaud s, Scleroderma Musculoskeletal Patient has history of Gout, Rheumatoid Arthritis Denies history of Osteoarthritis, Osteomyelitis Neurologic Denies history of Dementia, Neuropathy, Quadriplegia, Paraplegia, Seizure Disorder Oncologic Denies history of Received Chemotherapy, Received Radiation Psychiatric Denies history of Anorexia/bulimia, Confinement Anxiety Review of Systems (ROS) Eyes Complains or has symptoms of Vision Changes - blurred vision, Glasses / Contacts - glasses. Denies complaints or symptoms of Dry Eyes. Ear/Nose/Mouth/Throat The patient has no complaints or symptoms. Hematologic/Lymphatic Denies complaints or symptoms of Bleeding / Clotting Disorders, Human Immunodeficiency Virus. Respiratory Denies complaints or symptoms of Chronic or frequent coughs, Shortness of Breath. Cardiovascular Denies complaints or symptoms of Chest pain, LE edema. Gastrointestinal Denies complaints or symptoms of Frequent diarrhea, Nausea, Vomiting. Endocrine Denies complaints or symptoms of Hepatitis, Thyroid disease, Polydypsia (Excessive Thirst). Genitourinary Complains or  has symptoms of Incontinence/dribbling. Denies complaints or symptoms of Kidney failure/ Dialysis. Immunological The patient has no complaints or symptoms. Integumentary (Skin) Complains or has symptoms of Wounds, Bleeding or bruising tendency. Denies complaints or symptoms of Breakdown, Swelling. Musculoskeletal Complains or has symptoms of Muscle Weakness. Denies complaints or symptoms of Muscle Pain. Neurologic Denies complaints or symptoms of Numbness/parasthesias, Focal/Weakness. Oncologic The patient has no complaints or symptoms. Psychiatric Denies complaints or symptoms of Anxiety, Claustrophobia. Carlos [name], Carlos [name] ([id number]) Objective Constitutional sitting or standing blood pressure is within target range for patient.. pulse regular and within target range for patient.Marland Kitchen respirations regular, non-labored and within target range for patient.Marland Kitchen temperature within target range for patient.. Obese and well-hydrated in no acute distress. Vitals Time Taken: 9:18 AM, Height: 67 in, Source: Stated, Weight: 212 lbs, Source: Measured, BMI: 33.2, Temperature: 97.8 F, Pulse: 66 bpm, Respiratory Rate: 18 breaths/min, Blood Pressure: 123/56 mmHg. Eyes conjunctiva clear no eyelid edema noted. pupils equal round and reactive to light and accommodation. Ears, Nose, Mouth, and Throat no gross abnormality of ear auricles or external auditory canals. normal hearing noted during conversation. mucus membranes moist. Respiratory  normal breathing without difficulty. clear to auscultation bilaterally. Cardiovascular regular rate and rhythm with normal S1, S2. 2+ dorsalis pedis/posterior tibialis pulses. 1+ pitting edema of the bilateral lower extremities. Gastrointestinal (GI) soft, non-tender, non-distended, +BS. no ventral hernia noted. Musculoskeletal normal gait and posture. no significant deformity or arthritic changes, no loss or range of motion, no clubbing. Psychiatric this patient is  able to make decisions and demonstrates good insight into disease process. Alert and Oriented x 3. pleasant and cooperative. General Notes: Patient's wound showed an eschar area over the left anterior shin. However once we coded this with lidocaine and left it for several minutes and then came back to evaluate the eschar came off without any use of instruments the wound bed underneath appear to be completely healed. There does not appear to be any residual opening and no evidence of drainage. Obviously this was good news. The area does appear to be newly healed and will need to be protected for a couple weeks in my opinion to prevent any additional injury. Integumentary (Hair, Skin) Wound #1 status is Open. Original cause of wound was Trauma. The wound is located on the Left,Midline Lower Leg. The wound measures 0.1cm length x 0.1cm width x 0.1cm depth; 0.008cm^2 area and 0.001cm^3 volume. There is no tunneling or undermining noted. There is a none present amount of drainage noted. The wound margin is distinct with the outline attached to the wound base. There is no granulation within the wound bed. There is a large (67-100%) amount of necrotic tissue within the wound bed including Eschar. The periwound skin appearance did not exhibit: Callus, Crepitus, Excoriation, Induration, Rash, Scarring, Dry/Scaly, Maceration, Atrophie Blanche, Cyanosis, Ecchymosis, Hemosiderin Staining, Mottled, Pallor, Rubor, Erythema. Periwound temperature was noted as No Abnormality. The periwound has tenderness on palpation. General Notes: once scab removed by Jeri Cos III from wound, skin is healed under scab. Carlos [name], Carlos [name] ([id number]) Assessment Active Problems ICD-10 L03.116 - Cellulitis of left lower limb E11.622 - Type 2 diabetes mellitus with other skin ulcer L97.828 - Non-pressure chronic ulcer of other part of left lower leg with other specified severity N18.3 - Chronic kidney disease, stage 3  (moderate) Plan Wound Cleansing: Wound #1 Left,Midline Lower Leg: Clean wound with Normal Saline. Anesthetic (add to Medication List): Wound #1 Left,Midline Lower Leg: Topical Lidocaine 4% cream applied to wound bed prior to debridement (In Clinic Only). Primary Wound Dressing: Other: - cavilon skin barrier prep Secondary Dressing: Wound #1 Left,Midline Lower Leg: Boardered Foam Dressing Dressing Change Frequency: Wound #1 Left,Midline Lower Leg: Three times weekly Follow-up Appointments: Wound #1 Left,Midline Lower Leg: Other: - patient to call if needed The good news is other than covering this with a border foam or equivalent protection I do not think that we are going to need any additional treatment. Patient is very pleased to hear this and is glad that the wound does appear to be doing well and in fact is healed. We will see him for reevaluation in the future as needed if anything worsens. Otherwise it's this does seem to be doing so well there is no need for a set follow-up for him and patient was advised of this. The sin is a one time consultation. Electronic Signature(s) Signed: 03/08/2017 8:32:41 AM By: Worthy Keeler PA-C Entered By: Worthy Keeler on 03/07/2017 23:49:53 Carlos [name] ([id number]) -------------------------------------------------------------------------------- ROS/PFSH Details Patient Name: Carlos [name] Date of Service: 03/07/2017 9:15 AM Medical Record Number: [id number] Patient Account Number: [id number] Date of Birth/Sex: 16-Nov-1926 (84  y.o. Male) Treating RN: Roger Shelter Primary Care Provider: Thereasa Distance Other Clinician: Referring Provider: Alanson Aly Treating Provider/Extender: Melburn Hake, Dhanya Bogle Weeks in Treatment: 0 Information Obtained From Patient Wound History Do you currently have one or more open woundso Yes How many open wounds do you currently haveo 1 Approximately how long have you had your woundso 4 weeks How have you  been treating your wound(s) until nowo zinc cream and completed antibiotics Has your wound(s) ever healed and then re-openedo No Have you had any lab work done in the past montho Yes Who ordered the lab work doneo one month - [facility] Have you tested positive for an antibiotic resistant organism (MRSA, VRE)o No Have you tested positive for osteomyelitis (bone infection)o No Have you had any tests for circulation on your legso No Eyes Complaints and Symptoms: Positive for: Vision Changes - blurred vision; Glasses / Contacts - glasses Negative for: Dry Eyes Medical History: Positive for: Cataracts Negative for: Glaucoma; Optic Neuritis Hematologic/Lymphatic Complaints and Symptoms: Negative for: Bleeding / Clotting Disorders; Human Immunodeficiency Virus Medical History: Negative for: Anemia; Hemophilia; Human Immunodeficiency Virus; Lymphedema; Sickle Cell Disease Respiratory Complaints and Symptoms: Negative for: Chronic or frequent coughs; Shortness of Breath Medical History: Positive for: Pneumothorax - left lung; 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 bypass [place], Carlos [name] ([id number]) Negative for: Congestive Heart Failure; Deep Vein Thrombosis; Hypotension; Peripheral Arterial Disease; Peripheral Venous Disease; Phlebitis; Vasculitis Gastrointestinal Complaints and Symptoms: Negative for: Frequent diarrhea; Nausea; Vomiting Medical History: Negative for: Cirrhosis ; Colitis; Crohnos; Hepatitis A; Hepatitis B; Hepatitis C Endocrine Complaints and Symptoms: Negative for: Hepatitis; Thyroid disease; Polydypsia (Excessive Thirst) Medical History: Negative for: Type I  Diabetes; Type II Diabetes Genitourinary Complaints and Symptoms: Positive for: Incontinence/dribbling Negative for: Kidney failure/ Dialysis Medical History: Positive for: End Stage Renal Disease Immunological Complaints and Symptoms: No Complaints or Symptoms Complaints and Symptoms: Negative for: Hives; Itching Medical History: Negative for: Lupus Erythematosus; Raynaudos; Scleroderma Integumentary (Skin) Complaints and Symptoms: Positive for: Wounds; Bleeding or bruising tendency Negative for: Breakdown; Swelling Musculoskeletal Complaints and Symptoms: Positive for: Muscle Weakness Negative for: Muscle Pain Medical History: Positive for: Gout; Rheumatoid Arthritis Negative for: Osteoarthritis; Osteomyelitis Neurologic Complaints and Symptoms: Negative for: Numbness/parasthesias; Focal/Weakness [place], Carlos [name] ([id number]) Medical History: Negative for: Dementia; Neuropathy; Quadriplegia; Paraplegia; Seizure Disorder Psychiatric Complaints and Symptoms: Negative for: Anxiety; Claustrophobia Medical History: Negative for: Anorexia/bulimia; Confinement Anxiety Ear/Nose/Mouth/Throat Complaints and Symptoms: No Complaints or Symptoms Medical History: Negative for: Chronic sinus problems/congestion; Middle ear problems Oncologic Complaints and Symptoms: No Complaints or Symptoms Medical History: Negative for: Received Chemotherapy; Received Radiation HBO Extended History Items Eyes: Cataracts Immunizations Pneumococcal Vaccine: Received Pneumococcal Vaccination: No Implantable Devices Family and Social History Cancer: No; Diabetes: Yes - Mother; Heart Disease: No; Hypertension: No; Kidney Disease: No; Lung Disease: Yes - Siblings; Seizures: No; Stroke: No; Thyroid Problems: No; Tuberculosis: Yes - Siblings; Former smoker - pipe smoker stooped 2001; Marital Status - Widowed; Alcohol Use: Daily; Drug Use: No History; Caffeine Use: Daily; Financial Concerns: No; Food,  Clothing or Shelter Needs: No; Support System Lacking: No; Transportation Concerns: No; Advanced Directives: No; Patient does not want information on Advanced Directives; Do not resuscitate: No; Living Will: No; Medical Power of Attorney: No Electronic Signature(s) Signed: 03/07/2017 2:51:30  PM By: Roger Shelter Signed: 03/08/2017 8:32:41 AM By: Worthy Keeler PA-C Entered By: Roger Shelter on 03/07/2017 09:50:28 Carlos [name] ([id number]) -------------------------------------------------------------------------------- SuperBill Details Patient Name: Carlos [name] Date of Service: 03/07/2017 Medical Record Number: [id number] Patient Account Number: [id number] Date of Birth/Sex: [birth year] (84 y.o. 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 specified severity N18.3 Chronic kidney disease, stage 3 (moderate) Facility Procedures CPT4 Code: [id number] Description: 99213 - WOUND CARE VISIT-LEV 3 EST PT Modifier: Quantity: 1 Physician Procedures CPT4 Code Description: [id number] 99213 - WC PHYS LEVEL 3 - EST PT ICD-10 Diagnosis Description L03.116 Cellulitis of left lower limb E11.622 Type 2 diabetes mellitus with other skin ulcer L97.828 Non-pressure chronic ulcer of other part of left lower leg  with N18.3 Chronic kidney disease, stage 3 (moderate) Modifier: other specified s Quantity: 1 everity Electronic Signature(s) Signed: 03/08/2017 8:32:41 AM By: Worthy Keeler PA-C Entered By: Worthy Keeler on 03/07/2017 23:50:38

## 2017-03-08 NOTE — Progress Notes (Signed)
DA, AUTHEMENT ([id number]) Visit Report for 03/07/2017 Abuse/Suicide Risk Screen Details Patient Name: Carlos [name], Carlos [name] Date of Service: 03/07/2017 9:15 AM Medical Record Number: [id number] Patient Account Number: [id number] Date of Birth/Sex: 1926-08-01 (84 y.o. Male) Treating RN: Roger Shelter Primary Care Zamya Culhane: Thereasa Distance Other Clinician: Referring Michela Herst: Alanson Aly Treating Naavya Postma/Extender: STONE III, [name] Weeks in Treatment: 0 Abuse/Suicide Risk Screen Items Answer ABUSE/SUICIDE RISK SCREEN: Has anyone close to you tried to hurt or harm you recentlyo No Do you feel uncomfortable with anyone in your familyo No Has anyone forced you do things that you didnot want to doo No Do you have any thoughts of harming yourselfo No Patient displays signs or symptoms of abuse and/or neglect. No Electronic Signature(s) Signed: 03/07/2017 2:51:30 PM By: Roger Shelter Entered By: Roger Shelter on 03/07/2017 09:51:15 Carlos [name] ([id number]) -------------------------------------------------------------------------------- Activities of Daily Living Details Patient Name: Carlos [name] Date of Service: 03/07/2017 9:15 AM Medical Record Number: [id number] Patient Account Number: [id number] Date of Birth/Sex: 11-13-1926 (84 y.o. Male) Treating RN: Roger Shelter Primary Care Thelma Viana: Thereasa Distance Other Clinician: Referring Damesha Lawler: Alanson Aly Treating Maddeline Roorda/Extender: Melburn Hake, [name] Weeks in Treatment: 0 Activities of Daily Living Items Answer Activities of Daily Living (Please select one for each item) Drive Automobile Completely Able Take Medications Completely Able Use Telephone Completely Able Care for Appearance Completely Able Use Toilet Completely Able Bath / Shower Completely Able Dress Self Completely Able Feed Self Completely Able Walk Completely Able Get In / Out Bed Completely Able Housework Completely Able Prepare Meals Completely [place] for Self Completely Able Electronic Signature(s) Signed: 03/07/2017 2:51:30 PM By: Roger Shelter Entered By: Roger Shelter on 03/07/2017 09:51:39 Carlos [name] ([id number]) -------------------------------------------------------------------------------- Education Assessment Details Patient Name: Carlos [name] Date of Service: 03/07/2017 9:15 AM Medical Record Number: [id number] Patient Account Number: [id number] Date of Birth/Sex: Mar 19, 1926 (84 y.o. Male) Treating RN: Roger Shelter Primary Care Ambriel Gorelick: Thereasa Distance Other Clinician: Referring Farrin Shadle: Alanson Aly Treating Tyresse Jayson/Extender: Melburn Hake, [name] Weeks in Treatment: 0 Primary Learner Assessed: Patient Learning Preferences/Education Level/Primary Language Learning Preference: Explanation Highest Education Level: College or Above Preferred Language: English Cognitive Barrier Assessment/Beliefs Language Barrier: No Translator Needed: No Memory Deficit: No Emotional Barrier: No Cultural/Religious Beliefs Affecting Medical Care: No Physical Barrier Assessment Impaired Vision: Yes Glasses Impaired Hearing: No Decreased Hand dexterity: No Knowledge/Comprehension Assessment Knowledge Level: High Comprehension Level: High Ability to understand written High instructions: Ability to understand verbal High instructions: Motivation Assessment Anxiety Level: Calm Cooperation: Cooperative Education Importance: Acknowledges Need Interest in Health Problems: Asks Questions Perception: Coherent Willingness to Engage in Self- High Management Activities: Readiness to Engage in Self- High Management Activities: Electronic Signature(s) Signed: 03/07/2017 2:51:30 PM By: Roger Shelter Entered By: Roger Shelter on 03/07/2017 09:52:23 Carlos [name] ([id number]) -------------------------------------------------------------------------------- Fall Risk Assessment Details Patient Name:  Carlos [name] Date of Service: 03/07/2017 9:15 AM Medical Record Number: [id number] Patient Account Number: [id number] Date of Birth/Sex: 20-Sep-1926 (84 y.o. Male) Treating RN: Roger Shelter Primary Care Dayven Linsley: Thereasa Distance Other Clinician: Referring Easton Sivertson: Alanson Aly Treating Murrel Freet/Extender: Melburn Hake, [name] Weeks in Treatment: 0 Fall Risk Assessment Items Have you had 2 or more falls in the last 12 monthso 0 No Have you had any fall that resulted in injury in the last 12 monthso 0 No FALL RISK ASSESSMENT: History of falling - immediate or within 3 months 0 No Secondary diagnosis 0 No Ambulatory aid None/bed rest/wheelchair/nurse 0 No Crutches/cane/walker 15 Yes Furniture 0 No IV Access/Saline Lock 0 No Gait/Training  Normal/bed rest/immobile 0 Yes Weak 0 No Impaired 0 No Mental Status Oriented to own ability 0 Yes Electronic Signature(s) Signed: 03/07/2017 2:51:30 PM By: Roger Shelter Entered By: Roger Shelter on 03/07/2017 09:56:34 Carlos [name] ([id number]) -------------------------------------------------------------------------------- Foot Assessment Details Patient Name: Carlos [name] Date of Service: 03/07/2017 9:15 AM Medical Record Number: [id number] Patient Account Number: [id number] Date of Birth/Sex: Apr 23, 1926 (84 y.o. Male) Treating RN: Roger Shelter Primary Care Shenika Quint: Thereasa Distance Other Clinician: Referring Jazzie Trampe: Alanson Aly Treating Chandell Attridge/Extender: STONE III, [name] Weeks in Treatment: 0 Foot Assessment Items Site Locations + = Sensation present, - = Sensation absent, C = Callus, U = Ulcer R = Redness, W = Warmth, M = Maceration, PU = Pre-ulcerative lesion F = Fissure, S = Swelling, D = Dryness Assessment Right: Left: Other Deformity: No No Prior Foot Ulcer: No No Prior Amputation: No No Charcot Joint: No No Ambulatory Status: Ambulatory Without Help Gait: Steady Electronic Signature(s) Signed: 03/07/2017 2:51:30  PM By: Roger Shelter Entered By: Roger Shelter on 03/07/2017 09:57:26 Carlos [name] ([id number]) -------------------------------------------------------------------------------- Nutrition Risk Assessment Details Patient Name: Carlos [name] Date of Service: 03/07/2017 9:15 AM Medical Record Number: [id number] Patient Account Number: [id number] Date of Birth/Sex: 05/18/1926 (85 y.o. Male) Treating RN: Roger Shelter Primary Care Elantra Caprara: Thereasa Distance Other Clinician: Referring Antavion Bartoszek: Alanson Aly Treating Nishka Heide/Extender: STONE III, [name] Weeks in Treatment: 0 Height (in): 67 Weight (lbs): 212 Body Mass Index (BMI): 33.2 Nutrition Risk Assessment Items NUTRITION RISK SCREEN: I have an illness or condition that made me change the kind and/or amount of 0 No food I eat I eat fewer than two meals per day 0 No I eat few fruits and vegetables, or milk products 0 No I have three or more drinks of beer, liquor or wine almost every day 0 No I have tooth or mouth problems that make it hard for me to eat 0 No I don't always have enough money to buy the food I need 0 No I eat alone most of the time 0 No I take three or more different prescribed or over-the-counter drugs a day 0 No Without wanting to, I have lost or gained 10 pounds in the last six months 0 No I am not always physically able to shop, cook and/or feed myself 0 No Nutrition Protocols Good Risk Protocol 0 No interventions needed Moderate Risk Protocol Electronic Signature(s) Signed: 03/07/2017 2:51:30 PM By: Roger Shelter Entered By: Roger Shelter on 03/07/2017 09:56:41

## 2017-04-24 ENCOUNTER — Ambulatory Visit
Admission: EM | Admit: 2017-04-24 | Discharge: 2017-04-24 | Disposition: A | Discharge status: Home / Self Care | Payer: Medicare PPO | Attending: Family Medicine | Admitting: Family Medicine

## 2017-04-24 ENCOUNTER — Other Ambulatory Visit: Payer: Self-pay

## 2017-04-24 ENCOUNTER — Encounter: Payer: Self-pay | Admitting: *Deleted

## 2017-04-24 DIAGNOSIS — J069 Acute upper respiratory infection, unspecified: Secondary | ICD-10-CM | POA: Diagnosis not present

## 2017-04-24 DIAGNOSIS — R05 Cough: Secondary | ICD-10-CM

## 2017-04-24 DIAGNOSIS — B9789 Other viral agents as the cause of diseases classified elsewhere: Secondary | ICD-10-CM

## 2017-04-24 MED ORDER — BENZONATATE 100 MG PO CAPS: 100.0000 mg | ORAL_CAPSULE | Freq: Three times a day (TID) | ORAL | 0 refills | Status: DC | PRN

## 2017-04-24 MED ORDER — HYDROCOD POLST-CPM POLST ER 10-8 MG/5ML PO SUER: 5.0000 mL | Freq: Every evening | ORAL | 0 refills | Status: DC | PRN

## 2017-04-24 NOTE — ED Provider Notes (Signed)
[facility]    CSN: [id number] Arrival date & time: 04/24/17  1418     History   Chief Complaint Chief Complaint  Patient presents with  . Nasal Congestion  . Cough  . Generalized Body Aches    HPI Carlos [name] is a 84 y.o. male.   The history is provided by the patient.  URI  Presenting symptoms: congestion, cough, fatigue, rhinorrhea and sore throat   Severity:  Moderate Onset quality:  Sudden Duration:  4 days Timing:  Constant Progression:  Unchanged Chronicity:  New Relieved by:  Nothing Associated symptoms: myalgias   Associated symptoms: no sinus pain and no wheezing   Risk factors: being elderly, chronic cardiac disease, chronic kidney disease and sick contacts   Risk factors: no chronic respiratory disease, no diabetes mellitus, no immunosuppression, no recent illness and no recent travel     Past Medical History:  Diagnosis Date  . Arthritis   . Atrial fibrillation ([place])   . CHF (congestive heart failure) ([place])   . Chronic kidney disease    kidney stones  . Coronary artery disease   . Diabetes mellitus without complication ([place])   . Diverticulosis   . GERD (gastroesophageal reflux disease)   . Gout   . Hyperlipidemia   . Hypertension   . Myocardial infarction ([place]) 2001  . Peripheral vascular disease ([place])   . Pneumothorax, left 1950    Patient Active Problem List   Diagnosis Date Noted  . Acute pain of left shoulder 09/08/2015  . CKD (chronic kidney disease), stage IV ([place]) 12/24/2014  . Bradycardia 12/02/2014  . TI (tricuspid incompetence) 10/21/2014  . DD (diverticular disease) 07/30/2014  . Essential (primary) hypertension 07/30/2014  . Tuberculosis 07/30/2014  . Hyperlipidemia 07/30/2014  . Chronic systolic CHF (congestive heart failure), NYHA class 2 ([place]) 02/12/2014  . AF (paroxysmal atrial fibrillation) ([place]) 02/12/2014  . Paroxysmal A-fib ([place]) 02/12/2014  . Accumulation of fluid in tissues 07/24/2013  . Acid reflux  07/24/2013  . Gout 07/24/2013  . Diabetes mellitus, type 2 ([place]) 07/24/2013  . Type 2 diabetes mellitus with renal manifestations, controlled ([place]) 07/24/2013  . Peripheral blood vessel disorder ([place]) 07/19/2013  . PVD (peripheral vascular disease) ([place]) 07/19/2013  . Combined fat and carbohydrate induced hyperlipemia 05/21/2013  . Encounter for therapeutic drug monitoring 12/18/2012  . Chronic kidney disease (CKD), stage IV (severe) ([place]) 12/18/2012  . Arteriosclerosis of coronary artery 12/18/2012  . Decreased potassium in the blood 12/18/2012  . H/O coronary artery bypass surgery 12/18/2012  . H/O total knee replacement 12/18/2012  . Encounter for therapeutic drug level monitoring 12/18/2012    Past Surgical History:  Procedure Laterality Date  . CARDIAC CATHETERIZATION    . CARDIAC SURGERY     4 bypass  . CHOLECYSTECTOMY    . CORONARY ARTERY BYPASS GRAFT    . CYSTOSCOPY W/ RETROGRADES N/A 12/03/2014   Procedure: CYSTOSCOPY WITH ATTEMPT FOR RETROGRADE PYELOGRAM;  Surgeon: [name], MD;  Location: [facility] ORS;  Service: Urology;  Laterality: N/A;  . CYSTOSCOPY WITH LITHOLAPAXY N/A 12/03/2014   Procedure: CYSTOSCOPY WITH LITHOLAPAXY WITH HOLMIUM LASER ;  Surgeon: [name], MD;  Location: [facility] ORS;  Service: Urology;  Laterality: N/A;  . FEMUR SURGERY    . GALLBLADDER SURGERY    . HERNIA REPAIR    . JOINT REPLACEMENT Right    Total Knee Replacement  . LUNG SURGERY Left   . REPLACEMENT TOTAL KNEE  Home Medications    Prior to Admission medications   Medication Sig Start Date End Date Taking? Authorizing Provider  allopurinol (ZYLOPRIM) 300 MG tablet Take 150 mg by mouth daily.  05/09/14  Yes [provider]  aspirin EC 81 MG tablet Take 81 mg by mouth daily.    Yes [provider]  atenolol (TENORMIN) 25 MG tablet TAKE 1 TABLET ONE TIME DAILY FOR BLOOD PRESSURE 01/21/14  Yes [provider]  Multiple Vitamin (MULTI-VITAMINS)  TABS Take 1 tablet by mouth daily.    Yes [provider]  omeprazole (PRILOSEC) 20 MG capsule Take 20 mg by mouth daily.  05/19/14  Yes [provider]  simvastatin (ZOCOR) 40 MG tablet Take 40 mg by mouth daily at 6 PM.  07/07/14  Yes [provider]  torsemide (DEMADEX) 20 MG tablet Take 20 mg by mouth daily.  10/03/13  Yes [provider]  warfarin (COUMADIN) 3 MG tablet Take 3 mg by mouth daily at 6 PM.  05/02/14  Yes [provider]  benzonatate (TESSALON) 100 MG capsule Take 1 capsule (100 mg total) by mouth 3 (three) times daily as needed. 04/24/17   Norval Gable, MD  chlorpheniramine-HYDROcodone (TUSSIONEX PENNKINETIC ER) 10-8 MG/5ML SUER Take 5 mLs by mouth at bedtime as needed. 04/24/17   Norval Gable, MD  colchicine 0.6 MG tablet Take 0.6 mg by mouth as needed.     [provider]  doxycycline (VIBRA-TABS) 100 MG tablet Take 1 tablet (100 mg total) by mouth 2 (two) times daily. 01/20/17   Norval Gable, MD  HYDROcodone-acetaminophen (NORCO) 5-325 MG tablet Take 1 tablet by mouth every 6 (six) hours as needed for moderate pain. Patient not taking: Reported on 09/22/2016 12/03/14   [name], MD  Investigational - Study Medication Additional Study Details: Inject 1 each subcutaneously every fourteen days    [provider]  pantoprazole (PROTONIX) 40 MG tablet Take by mouth. 12/29/14   [provider]  solifenacin (VESICARE) 5 MG tablet Take 1 tablet (5 mg total) by mouth daily. 03/31/16   [name], MD  traMADol (ULTRAM) 50 MG tablet Take 50 mg by mouth every 6 (six) hours as needed.     [provider]    Family History Family History  Problem Relation Age of Onset  . Tuberculosis Brother     Social History Social History   Tobacco Use  . Smoking status: Former Smoker    Types: Pipe    Last attempt to quit: 07/30/2006    Years since quitting: 10.7  . Smokeless tobacco: Never Used    Substance Use Topics  . Alcohol use: Yes    Alcohol/week: 2.4 oz    Types: 2 Glasses of wine, 2 Standard drinks or equivalent per week  . Drug use: No     Allergies   Morphine and related   Review of Systems Review of Systems  Constitutional: Positive for fatigue.  HENT: Positive for congestion, rhinorrhea and sore throat. Negative for sinus pain.   Respiratory: Positive for cough. Negative for wheezing.   Musculoskeletal: Positive for myalgias.     Physical Exam Triage Vital Signs ED Triage Vitals  Enc Vitals Group     BP 04/24/17 1446 132/64     Pulse Rate 04/24/17 1446 67     Resp 04/24/17 1446 16     Temp 04/24/17 1446 98.4 F (36.9 C)     Temp Source 04/24/17 1446 Oral  SpO2 04/24/17 1446 97 %     Weight 04/24/17 1449 208 lb (94.3 kg)     Height 04/24/17 1449 5\' 7"  (1.702 m)     Head Circumference --      Peak Flow --      Pain Score 04/24/17 1449 0     Pain Loc --      Pain Edu? --      Excl. in [place]? --    No data found.  Updated Vital Signs BP 132/64 (BP Location: Left Arm)   Pulse 67   Temp 98.4 F (36.9 C) (Oral)   Resp 16   Ht 5\' 7"  (1.702 m)   Wt 208 lb (94.3 kg)   SpO2 97%   BMI 32.58 kg/m   Visual Acuity Right Eye Distance:   Left Eye Distance:   Bilateral Distance:    Right Eye Near:   Left Eye Near:    Bilateral Near:     Physical Exam  Constitutional: He appears well-developed and well-nourished. No distress.  HENT:  Head: Normocephalic and atraumatic.  Right Ear: Tympanic membrane, external ear and ear canal normal.  Left Ear: Tympanic membrane, external ear and ear canal normal.  Nose: Rhinorrhea present.  Mouth/Throat: Uvula is midline, oropharynx is clear and moist and mucous membranes are normal. No oropharyngeal exudate or tonsillar abscesses.  Eyes: Conjunctivae and EOM are normal. Pupils are equal, round, and reactive to light. Right eye exhibits no discharge. Left eye exhibits no discharge. No scleral icterus.   Neck: Normal range of motion. Neck supple. No tracheal deviation present. No thyromegaly present.  Cardiovascular: Normal rate, regular rhythm and normal heart sounds.  Pulmonary/Chest: Effort normal and breath sounds normal. No stridor. No respiratory distress. He has no wheezes. He has no rales. He exhibits no tenderness.  Lymphadenopathy:    He has no cervical adenopathy.  Neurological: He is alert.  Skin: Skin is warm and dry. No rash noted. He is not diaphoretic.  Nursing note and vitals reviewed.    UC Treatments / Results  Labs (all labs ordered are listed, but only abnormal results are displayed) Labs Reviewed - No data to display  EKG  EKG Interpretation None       Radiology No results found.  Procedures Procedures (including critical care time)  Medications Ordered in UC Medications - No data to display   Initial Impression / Assessment and Plan / UC Course  I have reviewed the triage vital signs and the nursing notes.  Pertinent labs & imaging results that were available during my care of the patient were reviewed by me and considered in my medical decision making (see chart for details).       Final Clinical Impressions(s) / UC Diagnoses   Final diagnoses:  Viral URI with cough    ED Discharge Orders        Ordered    benzonatate (TESSALON) 100 MG capsule  3 times daily PRN     04/24/17 1525    chlorpheniramine-HYDROcodone (TUSSIONEX PENNKINETIC ER) 10-8 MG/5ML SUER  At bedtime PRN     04/24/17 1530     1. diagnosis reviewed with patient 2. rx as per orders above; reviewed possible side effects, interactions, risks and benefits  3. Recommend supportive treatment with  4. Follow-up prn if symptoms worsen or don't improve  Controlled Substance Prescriptions [place] Controlled Substance Registry consulted? Not Applicable   Norval Gable, MD 04/24/17 [phone]

## 2017-04-24 NOTE — ED Triage Notes (Signed)
Patient started having symptoms of cough, sore throat, nasal congestion and body aches 3 days ago.

## 2017-06-17 ENCOUNTER — Other Ambulatory Visit: Payer: Self-pay

## 2017-06-17 ENCOUNTER — Ambulatory Visit
Admission: EM | Admit: 2017-06-17 | Discharge: 2017-06-17 | Disposition: A | Discharge status: Home / Self Care | Payer: Medicare PPO | Attending: Family Medicine | Admitting: Family Medicine

## 2017-06-17 ENCOUNTER — Encounter: Payer: Self-pay | Admitting: Gynecology

## 2017-06-17 DIAGNOSIS — T783XXA Angioneurotic edema, initial encounter: Secondary | ICD-10-CM

## 2017-06-17 MED ORDER — PREDNISONE 50 MG PO TABS: ORAL_TABLET | ORAL | 0 refills | Status: DC

## 2017-06-17 MED ORDER — CETIRIZINE HCL 10 MG PO TABS: 10.0000 mg | ORAL_TABLET | Freq: Every day | ORAL | 0 refills | Status: DC

## 2017-06-17 NOTE — Discharge Instructions (Signed)
Medications as prescribed. ° °Take care ° °Dr. Analynn Daum  °

## 2017-06-17 NOTE — ED Provider Notes (Signed)
[facility]   CSN: [id number] Arrival date & time: 06/17/17  1028  History   Chief Complaint Chief Complaint  Patient presents with  . Lip Laceration   HPI  84 year old male presents with lip swelling.  Patient states that yesterday he noticed a bump on the inside of his lower lip.  He woke this morning with a severely swollen right lower lip.  No tongue swelling.  No shortness of breath.  He has had ongoing allergic symptoms with watery eyes/itchy eyes.  No new changes or exposures.  No medication changes per his report.  He is not on an ACE or an ARB.  No known exacerbating or relieving factors.  No other associated symptoms.  No other complaints or concerns at this time.  Past Medical History:  Diagnosis Date  . Arthritis   . Atrial fibrillation ([place])   . CHF (congestive heart failure) ([place])   . Chronic kidney disease    kidney stones  . Coronary artery disease   . Diabetes mellitus without complication ([place])   . Diverticulosis   . GERD (gastroesophageal reflux disease)   . Gout   . Hyperlipidemia   . Hypertension   . Myocardial infarction ([place]) 2001  . Peripheral vascular disease ([place])   . Pneumothorax, left 1950   Patient Active Problem List   Diagnosis Date Noted  . Acute pain of left shoulder 09/08/2015  . CKD (chronic kidney disease), stage IV ([place]) 12/24/2014  . Bradycardia 12/02/2014  . TI (tricuspid incompetence) 10/21/2014  . DD (diverticular disease) 07/30/2014  . Essential (primary) hypertension 07/30/2014  . Tuberculosis 07/30/2014  . Hyperlipidemia 07/30/2014  . Chronic systolic CHF (congestive heart failure), NYHA class 2 ([place]) 02/12/2014  . AF (paroxysmal atrial fibrillation) ([place]) 02/12/2014  . Paroxysmal A-fib ([place]) 02/12/2014  . Accumulation of fluid in tissues 07/24/2013  . Acid reflux 07/24/2013  . Gout 07/24/2013  . Diabetes mellitus, type 2 ([place]) 07/24/2013  . Type 2 diabetes mellitus with renal manifestations, controlled ([place])  07/24/2013  . Peripheral blood vessel disorder ([place]) 07/19/2013  . PVD (peripheral vascular disease) ([place]) 07/19/2013  . Combined fat and carbohydrate induced hyperlipemia 05/21/2013  . Encounter for therapeutic drug monitoring 12/18/2012  . Chronic kidney disease (CKD), stage IV (severe) ([place]) 12/18/2012  . Arteriosclerosis of coronary artery 12/18/2012  . Decreased potassium in the blood 12/18/2012  . H/O coronary artery bypass surgery 12/18/2012  . H/O total knee replacement 12/18/2012  . Encounter for therapeutic drug level monitoring 12/18/2012   Past Surgical History:  Procedure Laterality Date  . CARDIAC CATHETERIZATION    . CARDIAC SURGERY     4 bypass  . CHOLECYSTECTOMY    . CORONARY ARTERY BYPASS GRAFT    . CYSTOSCOPY W/ RETROGRADES N/A 12/03/2014   Procedure: CYSTOSCOPY WITH ATTEMPT FOR RETROGRADE PYELOGRAM;  Surgeon: [name], MD;  Location: [facility] ORS;  Service: Urology;  Laterality: N/A;  . CYSTOSCOPY WITH LITHOLAPAXY N/A 12/03/2014   Procedure: CYSTOSCOPY WITH LITHOLAPAXY WITH HOLMIUM LASER ;  Surgeon: [name], MD;  Location: [facility] ORS;  Service: Urology;  Laterality: N/A;  . FEMUR SURGERY    . GALLBLADDER SURGERY    . HERNIA REPAIR    . JOINT REPLACEMENT Right    Total Knee Replacement  . LUNG SURGERY Left   . REPLACEMENT TOTAL KNEE     Home Medications    Prior to Admission medications   Medication Sig Start Date End Date Taking? Authorizing Provider  allopurinol (ZYLOPRIM) 300  MG tablet Take 150 mg by mouth daily.  05/09/14  Yes [provider]  aspirin EC 81 MG tablet Take 81 mg by mouth daily.    Yes [provider]  atenolol (TENORMIN) 25 MG tablet TAKE 1 TABLET ONE TIME DAILY FOR BLOOD PRESSURE 01/21/14  Yes [provider]  colchicine 0.6 MG tablet Take 0.6 mg by mouth as needed.    Yes [provider]  Multiple Vitamin (MULTI-VITAMINS) TABS Take 1 tablet by mouth daily.    Yes [provider]    omeprazole (PRILOSEC) 20 MG capsule Take 20 mg by mouth daily.  05/19/14  Yes [provider]  pantoprazole (PROTONIX) 40 MG tablet Take by mouth. 12/29/14  Yes [provider]  simvastatin (ZOCOR) 40 MG tablet Take 40 mg by mouth daily at 6 PM.  07/07/14  Yes [provider]  torsemide (DEMADEX) 20 MG tablet Take 20 mg by mouth daily.  10/03/13  Yes [provider]  traMADol (ULTRAM) 50 MG tablet Take 50 mg by mouth every 6 (six) hours as needed.    Yes [provider]  warfarin (COUMADIN) 3 MG tablet Take 3 mg by mouth daily at 6 PM.  05/02/14  Yes [provider]  cetirizine (ZYRTEC) 10 MG tablet Take 1 tablet (10 mg total) by mouth daily. 06/17/17   Coral Spikes, DO  predniSONE (DELTASONE) 50 MG tablet 1 tablet daily x 5 days. 06/17/17   Coral Spikes, DO   Family History Family History  Problem Relation Age of Onset  . Tuberculosis Brother    Social History Social History   Tobacco Use  . Smoking status: Former Smoker    Types: Pipe    Last attempt to quit: 07/30/2006    Years since quitting: 10.8  . Smokeless tobacco: Never Used  Substance Use Topics  . Alcohol use: Yes    Alcohol/week: 2.4 oz    Types: 2 Glasses of wine, 2 Standard drinks or equivalent per week  . Drug use: No   Allergies   Morphine and related   Review of Systems Review of Systems  HENT:       Lower lip swelling.  Eyes: Positive for itching.  Respiratory: Negative.    Physical Exam Triage Vital Signs ED Triage Vitals  Enc Vitals Group     BP 06/17/17 1037 119/67     Pulse Rate 06/17/17 1037 70     Resp 06/17/17 1037 16     Temp 06/17/17 1037 97.9 F (36.6 C)     Temp Source 06/17/17 1037 Oral     SpO2 06/17/17 1037 96 %     Weight 06/17/17 1039 204 lb (92.5 kg)     Height --      Head Circumference --      Peak Flow --      Pain Score 06/17/17 1039 3     Pain Loc --      Pain Edu? --      Excl. in [place]? --    Updated Vital Signs BP 119/67  (BP Location: Left Arm)   Pulse 70   Temp 97.9 F (36.6 C) (Oral)   Resp 16   Wt 204 lb (92.5 kg)   SpO2 96%   BMI 31.95 kg/m   Physical Exam  Constitutional: He is oriented to person, place, and time. He appears well-developed. No distress.  HENT:  Head: Normocephalic and atraumatic.  Mouth/Throat: Oropharynx is clear and moist.  Severe lower lip swelling.  Cardiovascular: Normal rate and regular rhythm.  Pulmonary/Chest: Effort normal and breath sounds normal. He has no wheezes. He has no rales.  Neurological: He is alert and oriented to person, place, and time.  Psychiatric: He has a normal mood and affect. His behavior is normal.  Nursing note and vitals reviewed.   UC Treatments / Results  Labs (all labs ordered are listed, but only abnormal results are displayed) Labs Reviewed - No data to display  EKG None Radiology No results found.  Procedures Procedures (including critical care time)  Medications Ordered in UC Medications - No data to display   Initial Impression / Assessment and Plan / UC Course  I have reviewed the triage vital signs and the nursing notes.  Pertinent labs & imaging results that were available during my care of the patient were reviewed by me and considered in my medical decision making (see chart for details).     84 year old male presents with angioedema.  Treating with prednisone and Zyrtec.  Etiology uncertain at this time.  Final Clinical Impressions(s) / UC Diagnoses   Final diagnoses:  Angioedema, initial encounter    ED Discharge Orders        Ordered    predniSONE (DELTASONE) 50 MG tablet     06/17/17 1107    cetirizine (ZYRTEC) 10 MG tablet  Daily     06/17/17 1107     Controlled Substance Prescriptions [place] Controlled Substance Registry consulted? Not Applicable   Coral Spikes, DO 06/17/17 1153

## 2017-06-17 NOTE — ED Triage Notes (Signed)
Per patient notice blistered on lip x yesterday. Per patient woke up this morning with swollen lips.

## 2017-07-05 ENCOUNTER — Ambulatory Visit
Admission: EM | Admit: 2017-07-05 | Discharge: 2017-07-05 | Disposition: A | Discharge status: Home / Self Care | Payer: Medicare PPO | Attending: Family Medicine | Admitting: Family Medicine

## 2017-07-05 ENCOUNTER — Encounter: Payer: Self-pay | Admitting: Emergency Medicine

## 2017-07-05 ENCOUNTER — Other Ambulatory Visit: Payer: Self-pay

## 2017-07-05 DIAGNOSIS — S70361A Insect bite (nonvenomous), right thigh, initial encounter: Secondary | ICD-10-CM

## 2017-07-05 DIAGNOSIS — L03115 Cellulitis of right lower limb: Secondary | ICD-10-CM | POA: Diagnosis not present

## 2017-07-05 MED ORDER — DOXYCYCLINE HYCLATE 100 MG PO CAPS: 100.0000 mg | ORAL_CAPSULE | Freq: Two times a day (BID) | ORAL | 0 refills | Status: DC

## 2017-07-05 NOTE — Discharge Instructions (Signed)
Please inform your provider of the antibiotics so your INR can be check soon.  Antibiotic as prescribed.  Take care  Dr. Lacinda Axon

## 2017-07-05 NOTE — ED Provider Notes (Signed)
[facility]    CSN: [id number] Arrival date & time: 07/05/17  1546  History   Chief Complaint Chief Complaint  Patient presents with  . Tick Removal   HPI  84 year old male presents with concerns for tick bite.  Patient states that he was bitten by a tick presumedly.  He states that he felt a bite on the area and attempted to flick the insect off.  He was unable to do so and states that he had to pull it off.  He did not save the tick.  He did not look at the insect.  This occurred on Monday.  He states that the area is red, warm.  Area is located in the right inner thigh.  No fevers or chills.  No medications or interventions tried.  No other associated symptoms.  No other complaints.  Past Medical History:  Diagnosis Date  . Arthritis   . Atrial fibrillation ([place])   . CHF (congestive heart failure) ([place])   . Chronic kidney disease    kidney stones  . Coronary artery disease   . Diabetes mellitus without complication ([place])   . Diverticulosis   . GERD (gastroesophageal reflux disease)   . Gout   . Hyperlipidemia   . Hypertension   . Myocardial infarction ([place]) 2001  . Peripheral vascular disease ([place])   . Pneumothorax, left 1950   Patient Active Problem List   Diagnosis Date Noted  . Acute pain of left shoulder 09/08/2015  . CKD (chronic kidney disease), stage IV ([place]) 12/24/2014  . Bradycardia 12/02/2014  . TI (tricuspid incompetence) 10/21/2014  . DD (diverticular disease) 07/30/2014  . Essential (primary) hypertension 07/30/2014  . Tuberculosis 07/30/2014  . Hyperlipidemia 07/30/2014  . Chronic systolic CHF (congestive heart failure), NYHA class 2 ([place]) 02/12/2014  . AF (paroxysmal atrial fibrillation) ([place]) 02/12/2014  . Paroxysmal A-fib ([place]) 02/12/2014  . Accumulation of fluid in tissues 07/24/2013  . Acid reflux 07/24/2013  . Gout 07/24/2013  . Diabetes mellitus, type 2 ([place]) 07/24/2013  . Type 2 diabetes mellitus with renal manifestations,  controlled ([place]) 07/24/2013  . Peripheral blood vessel disorder ([place]) 07/19/2013  . PVD (peripheral vascular disease) ([place]) 07/19/2013  . Combined fat and carbohydrate induced hyperlipemia 05/21/2013  . Encounter for therapeutic drug monitoring 12/18/2012  . Chronic kidney disease (CKD), stage IV (severe) ([place]) 12/18/2012  . Arteriosclerosis of coronary artery 12/18/2012  . Decreased potassium in the blood 12/18/2012  . H/O coronary artery bypass surgery 12/18/2012  . H/O total knee replacement 12/18/2012  . Encounter for therapeutic drug level monitoring 12/18/2012    Past Surgical History:  Procedure Laterality Date  . CARDIAC CATHETERIZATION    . CARDIAC SURGERY     4 bypass  . CHOLECYSTECTOMY    . CORONARY ARTERY BYPASS GRAFT    . CYSTOSCOPY W/ RETROGRADES N/A 12/03/2014   Procedure: CYSTOSCOPY WITH ATTEMPT FOR RETROGRADE PYELOGRAM;  Surgeon: [name], MD;  Location: [facility] ORS;  Service: Urology;  Laterality: N/A;  . CYSTOSCOPY WITH LITHOLAPAXY N/A 12/03/2014   Procedure: CYSTOSCOPY WITH LITHOLAPAXY WITH HOLMIUM LASER ;  Surgeon: [name], MD;  Location: [facility] ORS;  Service: Urology;  Laterality: N/A;  . FEMUR SURGERY    . GALLBLADDER SURGERY    . HERNIA REPAIR    . JOINT REPLACEMENT Right    Total Knee Replacement  . LUNG SURGERY Left   . REPLACEMENT TOTAL KNEE      Home Medications    Prior to Admission  medications   Medication Sig Start Date End Date Taking? Authorizing Provider  allopurinol (ZYLOPRIM) 300 MG tablet Take 150 mg by mouth daily.  05/09/14  Yes [provider]  aspirin EC 81 MG tablet Take 81 mg by mouth daily.    Yes [provider]  atenolol (TENORMIN) 25 MG tablet TAKE 1 TABLET ONE TIME DAILY FOR BLOOD PRESSURE 01/21/14  Yes [provider]  Multiple Vitamin (MULTI-VITAMINS) TABS Take 1 tablet by mouth daily.    Yes [provider]  Omega-3 Fatty Acids (FISH OIL) 1000 MG CAPS Take 1 capsule by mouth  daily.   Yes [provider]  omeprazole (PRILOSEC) 20 MG capsule Take 20 mg by mouth daily.  05/19/14  Yes [provider]  pantoprazole (PROTONIX) 40 MG tablet Take by mouth. 12/29/14  Yes [provider]  simvastatin (ZOCOR) 40 MG tablet Take 40 mg by mouth daily at 6 PM.  07/07/14  Yes [provider]  torsemide (DEMADEX) 20 MG tablet Take 20 mg by mouth daily.  10/03/13  Yes [provider]  warfarin (COUMADIN) 3 MG tablet Take 3 mg by mouth daily at 6 PM.  05/02/14  Yes [provider]  colchicine 0.6 MG tablet Take 0.6 mg by mouth as needed.     [provider]  doxycycline (VIBRAMYCIN) 100 MG capsule Take 1 capsule (100 mg total) by mouth 2 (two) times daily. 07/05/17   Coral Spikes, DO  traMADol (ULTRAM) 50 MG tablet Take 50 mg by mouth every 6 (six) hours as needed.     [provider]   Family History Family History  Problem Relation Age of Onset  . Lung cancer Mother   . Tuberculosis Brother    Social History Social History   Tobacco Use  . Smoking status: Former Smoker    Types: Pipe    Last attempt to quit: 07/30/2006    Years since quitting: 10.9  . Smokeless tobacco: Never Used  Substance Use Topics  . Alcohol use: Yes    Alcohol/week: 8.4 oz    Types: 14 Glasses of wine per week  . Drug use: No   Allergies   Morphine and related   Review of Systems Review of Systems  Constitutional: Negative.   Skin: Positive for wound.   Physical Exam Triage Vital Signs ED Triage Vitals [07/05/17 1559]  Enc Vitals Group     BP 113/73     Pulse Rate 86     Resp 16     Temp 98 F (36.7 C)     Temp Source Oral     SpO2 97 %     Weight 202 lb (91.6 kg)     Height 5\' 7"  (1.702 m)     Head Circumference      Peak Flow      Pain Score 0     Pain Loc      Pain Edu?      Excl. in [place]?    Updated Vital Signs BP 113/73 (BP Location: Left Arm)   Pulse 86   Temp 98 F (36.7 C) (Oral)   Resp 16   Ht 5'  7" (1.702 m)   Wt 202 lb (91.6 kg)   SpO2 97%   BMI 31.64 kg/m   Physical Exam  Constitutional: He is oriented to person, place, and time. He appears well-developed. No distress.  HENT:  Head: Normocephalic and atraumatic.  Pulmonary/Chest: Effort normal. No respiratory distress.  Neurological: He is alert and oriented to person, place, and time.  Skin:  Right inner thigh with a relatively large area of erythema surrounding the location of insect bite.  Warm.  No fluctuance.  No drainage.  Psychiatric: He has a normal mood and affect. His behavior is normal.  Nursing note and vitals reviewed.  UC Treatments / Results  Labs (all labs ordered are listed, but only abnormal results are displayed) Labs Reviewed - No data to display  EKG None  Radiology No results found.  Procedures Procedures (including critical care time)  Medications Ordered in UC Medications - No data to display  Initial Impression / Assessment and Plan / UC Course  I have reviewed the triage vital signs and the nursing notes.  Pertinent labs & imaging results that were available during my care of the patient were reviewed by me and considered in my medical decision making (see chart for details).    84 year old male presents with cellulitis.  Treated with doxycycline.  Advised him to have his INR checked in the next few days due to antibiotic use.  Final Clinical Impressions(s) / UC Diagnoses   Final diagnoses:  Cellulitis of right lower extremity     Discharge Instructions     Please inform your provider of the antibiotics so your INR can be check soon.  Antibiotic as prescribed.  Take care  Dr. Lacinda Axon    ED Prescriptions    Medication Sig Dispense Auth. Provider   doxycycline (VIBRAMYCIN) 100 MG capsule Take 1 capsule (100 mg total) by mouth 2 (two) times daily. 14 capsule Coral Spikes, DO     Controlled Substance Prescriptions [place] Controlled Substance Registry consulted? Not  Applicable   Coral Spikes, DO 07/05/17 1715

## 2017-07-05 NOTE — ED Triage Notes (Signed)
Patient in today c/o tick bite on his inner right thigh that he pulled off on Monday (07/03/17). Patient states the area is still red.

## 2017-09-28 ENCOUNTER — Ambulatory Visit: Payer: Medicare PPO

## 2017-10-13 ENCOUNTER — Ambulatory Visit
Admission: RE | Admit: 2017-10-13 | Discharge: 2017-10-13 | Disposition: A | Discharge status: Home / Self Care | Payer: Medicare PPO | Source: Ambulatory Visit | Attending: Urology | Admitting: Urology

## 2017-10-13 ENCOUNTER — Encounter: Payer: Self-pay | Admitting: Urology

## 2017-10-13 ENCOUNTER — Ambulatory Visit (INDEPENDENT_AMBULATORY_CARE_PROVIDER_SITE_OTHER): Payer: Medicare PPO | Admitting: Urology

## 2017-10-13 ENCOUNTER — Other Ambulatory Visit: Payer: Self-pay

## 2017-10-13 VITALS — BP 105/59 | HR 66 | Ht 67.0 in | Wt 209.8 lb

## 2017-10-13 DIAGNOSIS — N401 Enlarged prostate with lower urinary tract symptoms: Secondary | ICD-10-CM

## 2017-10-13 DIAGNOSIS — R351 Nocturia: Secondary | ICD-10-CM

## 2017-10-13 DIAGNOSIS — N200 Calculus of kidney: Secondary | ICD-10-CM | POA: Diagnosis not present

## 2017-10-13 MED ORDER — TAMSULOSIN HCL 0.4 MG PO CAPS: 0.4000 mg | ORAL_CAPSULE | Freq: Every day | ORAL | 11 refills | Status: DC

## 2017-10-13 NOTE — Patient Instructions (Signed)
Benign Prostatic Hyperplasia  Benign prostatic hyperplasia (BPH) is an enlarged prostate gland that is caused by the normal aging process and not by cancer. The prostate is a walnut-sized gland that is involved in the production of semen. It is located in front of the rectum and below the bladder. The bladder stores urine and the urethra is the tube that carries the urine out of the body. The prostate may get bigger as a man gets older.  An enlarged prostate can press on the urethra. This can make it harder to pass urine. The build-up of urine in the bladder can cause infection. Back pressure and infection may progress to bladder damage and kidney (renal) failure.  What are the causes?  This condition is part of a normal aging process. However, not all men develop problems from this condition. If the prostate enlarges away from the urethra, urine flow will not be blocked. If it enlarges toward the urethra and compresses it, there will be problems passing urine.  What increases the risk?  This condition is more likely to develop in men over the age of 50 years.  What are the signs or symptoms?  Symptoms of this condition include:  · Getting up often during the night to urinate.  · Needing to urinate frequently during the day.  · Difficulty starting urine flow.  · Decrease in size and strength of your urine stream.  · Leaking (dribbling) after urinating.  · Inability to pass urine. This needs immediate treatment.  · Inability to completely empty your bladder.  · Pain when you pass urine. This is more common if there is also an infection.  · Urinary tract infection (UTI).    How is this diagnosed?  This condition is diagnosed based on your medical history, a physical exam, and your symptoms. Tests will also be done, such as:  · A post-void bladder scan. This measures any amount of urine that may remain in your bladder after you finish urinating.  · A digital rectal exam. In a rectal exam, your health care provider  checks your prostate by putting a lubricated, gloved finger into your rectum to feel the back of your prostate gland. This exam detects the size of your gland and any abnormal lumps or growths.  · An exam of your urine (urinalysis).  · A prostate specific antigen (PSA) screening. This is a blood test used to screen for prostate cancer.  · An ultrasound. This test uses sound waves to electronically produce a picture of your prostate gland.    Your health care provider may refer you to a specialist in kidney and prostate diseases (urologist).  How is this treated?  Once symptoms begin, your health care provider will monitor your condition (active surveillance or watchful waiting). Treatment for this condition will depend on the severity of your condition. Treatment may include:  · Observation and yearly exams. This may be the only treatment needed if your condition and symptoms are mild.  · Medicines to relieve your symptoms, including:  ? Medicines to shrink the prostate.  ? Medicines to relax the muscle of the prostate.  · Surgery in severe cases. Surgery may include:  ? Prostatectomy. In this procedure, the prostate tissue is removed completely through an open incision or with a laparascope or robotics.  ? Transurethral resection of the prostate (TURP). In this procedure, a tool is inserted through the opening at the tip of the penis (urethra). It is used to cut away tissue of   the inner core of the prostate. The pieces are removed through the same opening of the penis. This removes the blockage.  ? Transurethral incision (TUIP). In this procedure, small cuts are made in the prostate. This lessens the prostate's pressure on the urethra.  ? Transurethral microwave thermotherapy (TUMT). This procedure uses microwaves to create heat. The heat destroys and removes a small amount of prostate tissue.  ? Transurethral needle ablation (TUNA). This procedure uses radio frequencies to destroy and remove a small amount of  prostate tissue.  ? Interstitial laser coagulation (ILC). This procedure uses a laser to destroy and remove a small amount of prostate tissue.  ? Transurethral electrovaporization (TUVP). This procedure uses electrodes to destroy and remove a small amount of prostate tissue.  ? Prostatic urethral lift. This procedure inserts an implant to push the lobes of the prostate away from the urethra.    Follow these instructions at home:  · Take over-the-counter and prescription medicines only as told by your health care provider.  · Monitor your symptoms for any changes. Contact your health care provider with any changes.  · Avoid drinking large amounts of liquid before going to bed or out in public.  · Avoid or reduce how much caffeine or alcohol you drink.  · Give yourself time when you urinate.  · Keep all follow-up visits as told by your health care provider. This is important.  Contact a health care provider if:  · You have unexplained back pain.  · Your symptoms do not get better with treatment.  · You develop side effects from the medicine you are taking.  · Your urine becomes very dark or has a bad smell.  · Your lower abdomen becomes distended and you have trouble passing your urine.  Get help right away if:  · You have a fever or chills.  · You suddenly cannot urinate.  · You feel lightheaded, or very dizzy, or you faint.  · There are large amounts of blood or clots in the urine.  · Your urinary problems become hard to manage.  · You develop moderate to severe low back or flank pain. The flank is the side of your body between the ribs and the hip.  These symptoms may represent a serious problem that is an emergency. Do not wait to see if the symptoms will go away. Get medical help right away. Call your local emergency services (911 in the [place] Do not drive yourself to the hospital.  Summary  · Benign prostatic hyperplasia (BPH) is an enlarged prostate that is caused by the normal aging process and not by  cancer.  · An enlarged prostate can press on the urethra. This can make it hard to pass urine.  · This condition is part of a normal aging process and is more likely to develop in men over the age of 50 years.  · Get help right away if you suddenly cannot urinate.  This information is not intended to replace advice given to you by your health care provider. Make sure you discuss any questions you have with your health care provider.  Document Released: 02/14/2005 Document Revised: 03/21/2016 Document Reviewed: 03/21/2016  Elsevier Interactive Patient Education © 2018 [employer]

## 2017-10-13 NOTE — Progress Notes (Signed)
   10/13/2017 1:09 PM   Carlos [name] Oct 07, 1926 [id number]  Reason for visit: Follow up LUTS, nephrolithiasis  HPI: Carlos [name] is an 84 year old male with history of cystolitholapaxy that we are following for urinary symptoms and nonobstructing right lower pole stones.  He self discontinued his Flomax over the last year secondary to cost.  Notably he was diagnosed with a urinary tract infection July 31, and just finished an antibiotic course.  He denies any fevers or dysuria, however he has chronic frequency and nocturia.  The symptoms have worsened since stopping his Flomax.  Denies flank pain or hematuria.  He is planning to take a trip to [place] this September.   ROS: Please see flowsheet from today's date for complete review of systems.  Physical Exam: BP (!) 105/59   Pulse 66   Ht 5\' 7"  (1.702 m)   Wt 209 lb 12.8 oz (95.2 kg)   BMI 32.86 kg/m   Constitutional:  Alert and oriented, No acute distress.  Elderly. Respiratory: Normal respiratory effort, no increased work of breathing. GI: Abdomen is soft, nontender, nondistended, no abdominal masses Skin: No rashes, bruises or suspicious lesions. Neurologic: Grossly intact, no focal deficits, moving all 4 extremities. Psychiatric: Normal mood and affect  Laboratory Data: Urine culture 09/27/2017 with >100,000 E. coli   Pertinent Imaging: I personally reviewed his KUB today.  Right lower pole stones are stable.  Assessment & Plan:   In summary, Carlos [name] is an 84 year old male on Coumadin that we are following for lower urinary tract symptoms and nonobstructive nephrolithiasis.  His stones are stable today on KUB.  He is completing antibiotic treatment for recent UTI.  Bladder scan in clinic today was 200cc, however patient had not voided yet and felt the urge to void.  1. Benign prostatic hyperplasia, lower urinary tract symptoms present -Provided [email or web address] coupon and prescription for Flomax today, cost is less than $10 per  month at [facility] -Discussed behavioral strategies including minimizing fluids in the evenings, and double voiding prior to bedtime.  2.  Nephrolithiasis Stones have been stable.  No further imaging required unless symptomatic  Return in about 1 year (around 10/14/2018) for RTC with PVR and IPSS.  Billey Co, [facility] [address], [place], [place] [address] [phone]

## 2018-01-22 ENCOUNTER — Other Ambulatory Visit: Payer: Self-pay

## 2018-01-22 ENCOUNTER — Ambulatory Visit: Payer: Medicare PPO | Admitting: Urology

## 2018-01-22 ENCOUNTER — Encounter: Payer: Self-pay | Admitting: Urology

## 2018-01-22 VITALS — BP 135/69 | HR 69 | Ht 67.0 in | Wt 218.8 lb

## 2018-01-22 DIAGNOSIS — N184 Chronic kidney disease, stage 4 (severe): Secondary | ICD-10-CM | POA: Diagnosis not present

## 2018-01-22 MED ORDER — FINASTERIDE 5 MG PO TABS: 5.0000 mg | ORAL_TABLET | Freq: Every day | ORAL | 11 refills | Status: DC

## 2018-01-22 NOTE — Progress Notes (Signed)
   01/22/2018 3:39 PM   Carlos [name] 1927-02-08 [id number]  Reason for visit: Follow up BPH/LUTS  HPI: I saw Carlos [name] back in urology clinic to discuss his urinary symptoms.  He is an 84 year old man who is quite frail on Coumadin who has been on Flomax long-term for urinary symptoms.  There is no imaging to assess prostate size.  He does have a history of a bladder stone treated by Carlos [name].  His primary complaint is nocturia 2-3 times per night, however he has 2 glasses of wine right before bed.  There are no aggravating or alleviating factors.  Severity is mild to moderate.  He has had multiple falls recently.  He has had a urinary tract infections in the past.  Baseline creatinine is around 2, eGFR 30  ROS: Please see flowsheet from today's date for complete review of systems.  Physical Exam: BP 135/69   Pulse 69   Ht _0  (1.702 m)   Wt 218 lb 12.8 oz (99.2 kg)   SpO2 92%   BMI 34.27 kg/m    Constitutional:  Alert and oriented, No acute distress. Respiratory: Normal respiratory effort, no increased work of breathing. GI: Abdomen is soft, nontender, nondistended, no abdominal masses GU: No CVA tenderness Skin: No rashes, bruises or suspicious lesions. Neurologic: Grossly intact, no focal deficits, moving all 4 extremities. Psychiatric: Normal mood and affect  Laboratory Data: Baseline creatinine 2, EGFR 30  Pertinent Imaging: None to review  Assessment & Plan:   In summary, Carlos [name] is a frail-appearing 84 year old male with history of CAD on Coumadin, multiple falls, as well as chronic stage IV kidney disease, and BPH with nocturia 2-3 times per night.  He does have a history of urinary infections as well as bladder stones.  He is currently on Flomax for his urinary symptoms.  We discussed options at length for his BPH and urinary symptoms.  With his multiple comorbidities and frailty I would not rush to any surgical interventions, especially as he is minimally  bothered by his symptoms.  We discussed the significant risk of bleeding with any surgical intervention, especially in patients on anticoagulants.  -Continue Flomax -Added finasteride 5 mg daily -Follow-up in 6 months for renal ultrasound, PVR and discuss lots -If he is still unsatisfied with his symptoms at that time, could consider transrectal ultrasound to evaluate for prostate size and possible UroLift   Return in about 6 months (around 07/23/2018) for renal ultrasound, PVR, discuss LUTS.  Billey Co, [facility] [address], [place], [place] [address] [phone]

## 2018-02-05 ENCOUNTER — Ambulatory Visit: Payer: Medicare PPO | Admitting: Urology

## 2018-07-24 ENCOUNTER — Encounter (INDEPENDENT_AMBULATORY_CARE_PROVIDER_SITE_OTHER): Payer: Self-pay

## 2018-07-24 ENCOUNTER — Ambulatory Visit: Payer: Medicare PPO

## 2018-07-24 ENCOUNTER — Ambulatory Visit
Admission: RE | Admit: 2018-07-24 | Discharge: 2018-07-24 | Disposition: A | Discharge status: Home / Self Care | Payer: Medicare Other | Source: Ambulatory Visit | Attending: Urology | Admitting: Urology

## 2018-07-24 ENCOUNTER — Other Ambulatory Visit: Payer: Self-pay

## 2018-07-24 DIAGNOSIS — N184 Chronic kidney disease, stage 4 (severe): Secondary | ICD-10-CM | POA: Diagnosis not present

## 2018-07-26 ENCOUNTER — Telehealth: Payer: Self-pay | Admitting: *Deleted

## 2018-07-26 ENCOUNTER — Ambulatory Visit: Payer: Medicare Other | Admitting: Urology

## 2018-07-26 ENCOUNTER — Encounter: Payer: Self-pay | Admitting: Urology

## 2018-07-26 NOTE — Telephone Encounter (Addendum)
Patient informed-verbalized understanding.   ----- Message from Billey Co, MD sent at 07/26/2018  4:07 PM EDT ----- No back up off urine from the bladder to the kidneys, and kidneys look to be draining well on kidney ultrasound. Keep follow up in August for urinary symptoms  Nickolas Madrid, MD 07/26/2018

## 2018-08-13 ENCOUNTER — Encounter: Payer: Self-pay | Admitting: Urology

## 2018-08-13 ENCOUNTER — Other Ambulatory Visit: Payer: Self-pay

## 2018-08-13 ENCOUNTER — Ambulatory Visit: Payer: Medicare Other | Admitting: Urology

## 2018-08-13 VITALS — BP 136/80 | HR 121 | Ht 67.0 in | Wt 210.0 lb

## 2018-08-13 DIAGNOSIS — R351 Nocturia: Secondary | ICD-10-CM

## 2018-08-13 DIAGNOSIS — N401 Enlarged prostate with lower urinary tract symptoms: Secondary | ICD-10-CM

## 2018-08-13 MED ORDER — TAMSULOSIN HCL 0.4 MG PO CAPS: 0.4000 mg | ORAL_CAPSULE | Freq: Every day | ORAL | 3 refills | Status: DC

## 2018-08-13 MED ORDER — FINASTERIDE 5 MG PO TABS: 5.0000 mg | ORAL_TABLET | Freq: Every day | ORAL | 3 refills | Status: AC

## 2018-08-13 NOTE — Progress Notes (Signed)
   08/13/2018 3:26 PM   Carlos [name] 11/25/1926 [id number]  Reason for visit: Follow up BPH/LUTS  HPI: I saw Carlos [name] back in clinic for his BPH and urinary symptoms.  He is a 85 year old man that is rather frail and has had long-term urinary symptoms managed with Flomax and finasteride.  Is a history of a bladder stone treated by Dr. Ivory Broad a few years ago.  Overall, he feels his urinary symptoms are very well controlled currently, and his primary complaint is nocturia 2-3 times per night.  He does admit to having 2 to 3 glasses of wine right before bed, and his nocturia is not very bothersome.  He denies any recent urinary tract infections.  He has baseline CKD with creatinine of 2, EGFR of 30.  Renal ultrasound 07/24/2018 showed no hydronephrosis.   ROS: Please see flowsheet from today's date for complete review of systems.  Physical Exam: BP 136/80   Pulse (!) 121   Ht _0  (1.702 m)   Wt 210 lb (95.3 kg)   BMI 32.89 kg/m    Pertinent Imaging:  I have personally reviewed the renal ultrasound, no hydronephrosis  Assessment & Plan:   In summary, he is a frail 85 year old male with well-controlled BPH and urinary symptoms on Flomax and finasteride.  He has CKD with an EGFR of 30, but no hydronephrosis on recent renal ultrasound.  RTC 1 year for PVR and symptom check  A total of 15 minutes were spent face-to-face with the patient, greater than 50% was spent in patient education, counseling, and coordination of care regarding BPH and urinary symptoms.   Billey Co, [facility] [address], [place], [place] [address] [phone]

## 2018-08-28 ENCOUNTER — Other Ambulatory Visit: Payer: Self-pay

## 2018-08-28 ENCOUNTER — Ambulatory Visit: Payer: Medicare Other

## 2018-08-28 ENCOUNTER — Ambulatory Visit
Admission: EM | Admit: 2018-08-28 | Discharge: 2018-08-28 | Disposition: A | Discharge status: Home / Self Care | Payer: Medicare Other

## 2018-08-28 DIAGNOSIS — M25552 Pain in left hip: Secondary | ICD-10-CM | POA: Diagnosis not present

## 2018-08-28 DIAGNOSIS — R52 Pain, unspecified: Secondary | ICD-10-CM | POA: Diagnosis not present

## 2018-08-28 NOTE — ED Provider Notes (Signed)
Name: Carlos [name] DOB: November 09, 1926 MRN: [id number] CSN: [id number] PCP: [name], MD  Arrival date and time:  08/28/18 1333  Chief Complaint:  Hip Pain   NOTE: Prior to seeing the patient today, I have reviewed the triage nursing documentation and vital signs. Clinical staff has updated patient's PMH/PSHx, current medication list, and drug allergies/intolerances to ensure comprehensive history available to assist in medical decision making.   History:   HPI: Carlos [name] is a 85 y.o. male who presents today with complaints of atraumatic pain in his LEFT hip that has been worsening over the course of the last 3 weeks. Patient has not fallen. Patient notes that the pain intermittently radiates down his leg into his toes, however the pain is described as an "aching" sensation rather than typical radicular pain. He denies distal paraesthesias. Pain limits mobility to the point that he has recently required the use of a rolling walker to assist him with safe ambulation. Patient notes that he has constant fears of falling citing the fact that his leg/hip "feels weak like it is going to give out". Patient has been seen in consult in the past by orthopedics, however he advises that they are unwilling to consider surgical intervention due to his age and significant co-morbidity profile. Patient states, "they told me to take pain pills and enjoy the rest of my life". Patient has undergone contralateral hip surgery in this past. He has an obvious seated hip to knee length discrepancy.   EMR reviewed. Patient was seen by his PCP Ellison Hughs, Chrissie Noa, MD) back in 05/2018 for the same complaint. At that time, he also reported a 3 week history of pain in the LEFT hip. Patient states, "well I guess it has been going on longer than I thought". Patient has an extensive past medical history limiting.preventing the use of NSAID medications. Patient was advised to use up to 2 grams of APAP daily to help with  his pain. Patient advising that he has a supply of Tramadol to use for severe pain that is unrelieved by recommended APAP. Patient notes that he has not been using the Tramadol citing that his PCP advised him to limit use as much as possible. Patient states, "I have just been suffering through the pain the best that I could. It is getting worse. I could not get in to see my regular doctor today, so I decided it was time to get this looked at".  Patient using dry heat as a complementary pain relief modality, which he notes has offered little to no perceived benefit in terms of his overall pain level.   Past Medical History:  Diagnosis Date   Arthritis    Atrial fibrillation (HCC)    CHF (congestive heart failure) ([place])    Chronic kidney disease    kidney stones   Coronary artery disease    Diabetes mellitus without complication (HCC)    Diverticulosis    GERD (gastroesophageal reflux disease)    Gout    Hyperlipidemia    Hypertension    Myocardial infarction [facility]) 2001   Peripheral vascular disease ([place])    Pneumothorax, left 1950    Past Surgical History:  Procedure Laterality Date   CARDIAC CATHETERIZATION     CARDIAC SURGERY     4 bypass   CHOLECYSTECTOMY     CORONARY ARTERY BYPASS GRAFT     CYSTOSCOPY W/ RETROGRADES N/A 12/03/2014   Procedure: CYSTOSCOPY WITH ATTEMPT FOR RETROGRADE PYELOGRAM;  Surgeon: Horald Pollen  Pilar Jarvis, MD;  Location: [facility] ORS;  Service: Urology;  Laterality: N/A;   CYSTOSCOPY WITH LITHOLAPAXY N/A 12/03/2014   Procedure: CYSTOSCOPY WITH LITHOLAPAXY WITH HOLMIUM LASER ;  Surgeon: [name], MD;  Location: [facility] ORS;  Service: Urology;  Laterality: N/A;   FEMUR SURGERY     GALLBLADDER SURGERY     HERNIA REPAIR     JOINT REPLACEMENT Right    Total Knee Replacement   LUNG SURGERY Left    REPLACEMENT TOTAL KNEE      Family History  Problem Relation Age of Onset   Lung cancer Mother    Tuberculosis Brother     Patient  Active Problem List   Diagnosis Date Noted   Acute pain of left shoulder 09/08/2015   CKD (chronic kidney disease), stage IV ([place]) 12/24/2014   Bradycardia 12/02/2014   TI (tricuspid incompetence) 10/21/2014   DD (diverticular disease) 07/30/2014   Essential (primary) hypertension 07/30/2014   Tuberculosis 07/30/2014   Hyperlipidemia 72/53/6644   Chronic systolic CHF (congestive heart failure), NYHA class 2 ([place]) 02/12/2014   AF (paroxysmal atrial fibrillation) ([place]) 02/12/2014   Paroxysmal A-fib ([place]) 02/12/2014   Accumulation of fluid in tissues 07/24/2013   Acid reflux 07/24/2013   Gout 07/24/2013   Diabetes mellitus, type 2 ([place]) 07/24/2013   Type 2 diabetes mellitus with renal manifestations, controlled ([place]) 07/24/2013   Peripheral blood vessel disorder ([place]) 07/19/2013   PVD (peripheral vascular disease) ([place]) 07/19/2013   Combined fat and carbohydrate induced hyperlipemia 05/21/2013   Encounter for therapeutic drug monitoring 12/18/2012   Chronic kidney disease (CKD), stage IV (severe) ([place]) 12/18/2012   Arteriosclerosis of coronary artery 12/18/2012   Decreased potassium in the blood 12/18/2012   H/O coronary artery bypass surgery 12/18/2012   H/O total knee replacement 12/18/2012   Encounter for therapeutic drug level monitoring 12/18/2012    Home Medications:    Current Meds  Medication Sig   allopurinol (ZYLOPRIM) 300 MG tablet Take 150 mg by mouth daily.    aspirin EC 81 MG tablet Take 81 mg by mouth daily.    atenolol (TENORMIN) 25 MG tablet TAKE 1 TABLET ONE TIME DAILY FOR BLOOD PRESSURE   finasteride (PROSCAR) 5 MG tablet Take 1 tablet (5 mg total) by mouth daily.   Multiple Vitamin (MULTI-VITAMINS) TABS Take 1 tablet by mouth daily.    Omega-3 Fatty Acids (FISH OIL) 1000 MG CAPS Take 1 capsule by mouth daily.   omeprazole (PRILOSEC) 20 MG capsule Take 20 mg by mouth daily.    simvastatin (ZOCOR) 40 MG tablet Take 40 mg by  mouth daily at 6 PM.    tamsulosin (FLOMAX) 0.4 MG CAPS capsule Take 1 capsule (0.4 mg total) by mouth daily.   torsemide (DEMADEX) 20 MG tablet Take 20 mg by mouth daily.    warfarin (COUMADIN) 3 MG tablet Take 3 mg by mouth daily at 6 PM.     Allergies:   Morphine and related  Review of Systems (ROS): Review of Systems  Constitutional: Negative for chills and fever.  HENT: Negative for congestion, rhinorrhea, sinus pain and sore throat.   Respiratory: Positive for shortness of breath (chronic). Negative for cough.   Cardiovascular: Negative for chest pain and palpitations.  Musculoskeletal: Positive for gait problem (2/2 pain). Negative for neck pain.       LEFT hip pain  Skin: Negative.   Neurological: Positive for weakness (generalized 2/2 deconditioning and age related debility). Negative for dizziness, numbness and headaches.  Triage Vital Signs: ED Triage Vitals  Enc Vitals Group     BP 08/28/18 1347 132/72     Pulse Rate 08/28/18 1347 73     Resp 08/28/18 1347 16     Temp 08/28/18 1347 98.3 F (36.8 C)     Temp Source 08/28/18 1347 Oral     SpO2 08/28/18 1347 96 %     Weight 08/28/18 1342 210 lb (95.3 kg)     Height 08/28/18 1342 5\' 7"  (1.702 m)     Head Circumference --      Peak Flow --      Pain Score 08/28/18 1342 10     Pain Loc --      Pain Edu? --      Excl. in [place]? --     Physical Exam: Physical Exam  Constitutional: He is oriented to person, place, and time and well-developed, well-nourished, and in no distress.  HENT:  Head: Normocephalic and atraumatic.  Mouth/Throat: Mucous membranes are normal.  Cardiovascular: Normal rate, normal heart sounds and intact distal pulses. An irregularly irregular rhythm present. Exam reveals no gallop and no friction rub.  No murmur heard. Pulmonary/Chest: Effort normal and breath sounds normal. No respiratory distress. He has no wheezes. He has no rales.  Musculoskeletal:     Left hip: He exhibits decreased  range of motion (2/2 pain), decreased strength (2/2 pain and overall age related deconditioning) and bony tenderness. He exhibits no swelling, no crepitus and no deformity.  Neurological: He is alert and oriented to person, place, and time. Gait normal. GCS score is 15.  Skin: Skin is warm and dry. No rash noted. No erythema.  Psychiatric: Mood, memory, affect and judgment normal.  Nursing note and vitals reviewed.   Urgent Care Treatments / Results:   LABS: PLEASE NOTE: all labs that were ordered this encounter are listed, however only abnormal results are displayed. Labs Reviewed - No data to display  EKG: -None  RADIOLOGY: No results found.  PROCEDURES: Procedures  MEDICATIONS RECEIVED THIS VISIT: Medications - No data to display  PERTINENT CLINICAL COURSE NOTES/UPDATES:   Initial Impression / Assessment and Plan / Urgent Care Course:    Lenus Trauger is a 85 y.o. male who presents to [facility] today with complaints of Hip Pain   Pertinent labs & imaging results that were available during my care of the patient were personally reviewed by me and considered in my medical decision making (see lab/imaging section of note for values and interpretations).  Patient overall well appearing and in no acute distress today in clinic. Exam reveals diffuse tenderness over the LEFT hip. There is no crepitus or ecchymosis. Pain is atraumatic. Patient using rolling walker to assist with ambulation secondary to pain. He has been seen in the past by orthopedics (? [facility]) for consideration of surgical intervention. Due to age and extensive co-morbidities, he has been deemed a poor surgical candidate. Patient has been using dry heat and daily scheduled APAP for pain, however he notes little to no perceived benefit. Discussed diagnostic plain films in clinic today as patient advises that he has not had any recent imaging of his hip. Due to issues with equipment in the clinic  today, radiology unable to carry order for radiographs secondary to patient's body habitus. Machine down and department having to use portable machine, which technicians feel will yield inferior imaging due to patients size and mobility issues. Alternative imaging options discussed with patient; hospital vs. outpatient  radiology center on [address] in [place]. Patient asking if he can have radiographs done at [facility]. Call placed to [facility] to inquire as to the possibility of outpatient imaging. I was advised that they "don't do xrays for outside people". I advised the person who I spoke with that this was a patient of one of their providers Ellison Hughs, MD), however she was adamant that imaging could not be performed, and that PCP could not see patient for at least a couple of weeks. Patient updated on conversations and issues regarding imaging. Electing to defer ordered radiographs at this time.   Patient asking for second orthopedic opinion, which is certainly reasonable. Call placed to [facility] orthopedics to secure an appointment for patient to be seen in consult. New patient appointment received for 09/04/2018 at 11:30 am in the [facility] location to see Dr. Leim Fabry. This works out well as patient lives in [place], and the orthopedic provider is in the same office as his PCP. Considered use of Lidoderm patch, however given patient's extensive cardiac history, this intervention is deemed to not be in his best interest. His CKD, CAD, and history of MI prevents use of topical interventions such as Flector patches and Voltaren. Options for pain control are limited in this patient. He will continue the scheduled APAP, keeping in mind the 2 gram limit previously set by his PCP. Discussed use of Tramadol as previously prescribed for pain control until seen by orthopedics. Patient notes that he has a healthy supply of this medication at home already; does not need a new Rx. He  was encouraged to apply heat/ice TID for at least 10-15 minutes at a time to help with his pain. Patient to continue to use rolling walker for safe ambulation assistance.   I have reviewed the follow up and strict return precautions for any new or worsening symptoms. Patient is aware of symptoms that would be deemed urgent/emergent, and would thus require further evaluation either here or in the emergency department. At the time of discharge, he verbalized understanding and consent with the discharge plan as it was reviewed with him. All questions were fielded by provider and/or clinic staff prior to patient discharge.    Final Clinical Impressions(s) / Urgent Care Diagnoses:   Final diagnoses:  Pain  Left hip pain    New Prescriptions:  No orders of the defined types were placed in this encounter.   Controlled Substance Prescriptions:  [place] Controlled Substance Registry consulted? Not Applicable  NOTE: This note was prepared using Dragon dictation software along with smaller phrase technology. Despite my best ability to proofread, there is the potential that transcriptional errors may still occur from this process, and are completely unintentional.     Karen Kitchens, NP 08/28/18 2339

## 2018-08-28 NOTE — Discharge Instructions (Addendum)
It was very nice seeing you today in clinic. Thank you for entrusting me with your care.   May use the Tramadol that you have for pain.   Follow up on 09/04/2018 at 11: 30 with Dr. Posey Pronto (orthopedics). If your symptoms/condition worsens, please seek follow up care either here or in the ER. Please remember, our [place] providers are "right here with you" when you need [place].   Again, it was my pleasure to take care of you today. Thank you for choosing our clinic. I hope that you start to feel better quickly.   Honor Loh, MSN, APRN, FNP-C, CEN Advanced Practice Provider [facility]

## 2018-08-28 NOTE — ED Triage Notes (Signed)
Patient complains of left sided hip pain that started around 3 weeks ago. Patient states that pain varies at times, reports that he has been using heat without relief.

## 2018-10-15 ENCOUNTER — Ambulatory Visit: Payer: Self-pay | Admitting: Urology

## 2019-05-03 ENCOUNTER — Other Ambulatory Visit: Payer: Self-pay | Admitting: Family Medicine

## 2019-05-03 DIAGNOSIS — N6310 Unspecified lump in the right breast, unspecified quadrant: Secondary | ICD-10-CM

## 2019-05-06 ENCOUNTER — Other Ambulatory Visit: Payer: Self-pay | Admitting: Family Medicine

## 2019-05-06 DIAGNOSIS — N6310 Unspecified lump in the right breast, unspecified quadrant: Secondary | ICD-10-CM

## 2019-05-23 ENCOUNTER — Inpatient Hospital Stay
Admission: AD | Admit: 2019-05-23 | Discharge: 2019-05-27 | DRG: 291 | Disposition: A | Discharge status: Home / Self Care | Payer: Medicare Other | Attending: Internal Medicine | Admitting: Internal Medicine

## 2019-05-23 ENCOUNTER — Other Ambulatory Visit: Payer: Self-pay

## 2019-05-23 ENCOUNTER — Encounter: Payer: Self-pay | Admitting: Internal Medicine

## 2019-05-23 DIAGNOSIS — D631 Anemia in chronic kidney disease: Secondary | ICD-10-CM | POA: Diagnosis present

## 2019-05-23 DIAGNOSIS — Z8719 Personal history of other diseases of the digestive system: Secondary | ICD-10-CM

## 2019-05-23 DIAGNOSIS — N184 Chronic kidney disease, stage 4 (severe): Secondary | ICD-10-CM | POA: Diagnosis present

## 2019-05-23 DIAGNOSIS — Z87442 Personal history of urinary calculi: Secondary | ICD-10-CM | POA: Diagnosis not present

## 2019-05-23 DIAGNOSIS — R600 Localized edema: Secondary | ICD-10-CM | POA: Diagnosis present

## 2019-05-23 DIAGNOSIS — Z9049 Acquired absence of other specified parts of digestive tract: Secondary | ICD-10-CM | POA: Diagnosis not present

## 2019-05-23 DIAGNOSIS — Z951 Presence of aortocoronary bypass graft: Secondary | ICD-10-CM

## 2019-05-23 DIAGNOSIS — Z20822 Contact with and (suspected) exposure to covid-19: Secondary | ICD-10-CM | POA: Diagnosis present

## 2019-05-23 DIAGNOSIS — E785 Hyperlipidemia, unspecified: Secondary | ICD-10-CM | POA: Diagnosis present

## 2019-05-23 DIAGNOSIS — Z7982 Long term (current) use of aspirin: Secondary | ICD-10-CM

## 2019-05-23 DIAGNOSIS — I5043 Acute on chronic combined systolic (congestive) and diastolic (congestive) heart failure: Secondary | ICD-10-CM | POA: Diagnosis present

## 2019-05-23 DIAGNOSIS — Z96651 Presence of right artificial knee joint: Secondary | ICD-10-CM | POA: Diagnosis present

## 2019-05-23 DIAGNOSIS — E1122 Type 2 diabetes mellitus with diabetic chronic kidney disease: Secondary | ICD-10-CM | POA: Diagnosis present

## 2019-05-23 DIAGNOSIS — I252 Old myocardial infarction: Secondary | ICD-10-CM

## 2019-05-23 DIAGNOSIS — I130 Hypertensive heart and chronic kidney disease with heart failure and stage 1 through stage 4 chronic kidney disease, or unspecified chronic kidney disease: Principal | ICD-10-CM | POA: Diagnosis present

## 2019-05-23 DIAGNOSIS — E1151 Type 2 diabetes mellitus with diabetic peripheral angiopathy without gangrene: Secondary | ICD-10-CM | POA: Diagnosis present

## 2019-05-23 DIAGNOSIS — M109 Gout, unspecified: Secondary | ICD-10-CM | POA: Diagnosis present

## 2019-05-23 DIAGNOSIS — I480 Paroxysmal atrial fibrillation: Secondary | ICD-10-CM | POA: Diagnosis present

## 2019-05-23 DIAGNOSIS — Z885 Allergy status to narcotic agent status: Secondary | ICD-10-CM

## 2019-05-23 DIAGNOSIS — K219 Gastro-esophageal reflux disease without esophagitis: Secondary | ICD-10-CM | POA: Diagnosis present

## 2019-05-23 DIAGNOSIS — Z79899 Other long term (current) drug therapy: Secondary | ICD-10-CM

## 2019-05-23 DIAGNOSIS — Z801 Family history of malignant neoplasm of trachea, bronchus and lung: Secondary | ICD-10-CM

## 2019-05-23 DIAGNOSIS — Z831 Family history of other infectious and parasitic diseases: Secondary | ICD-10-CM | POA: Diagnosis not present

## 2019-05-23 DIAGNOSIS — I2510 Atherosclerotic heart disease of native coronary artery without angina pectoris: Secondary | ICD-10-CM | POA: Diagnosis present

## 2019-05-23 DIAGNOSIS — E119 Type 2 diabetes mellitus without complications: Secondary | ICD-10-CM

## 2019-05-23 DIAGNOSIS — Z7901 Long term (current) use of anticoagulants: Secondary | ICD-10-CM | POA: Diagnosis not present

## 2019-05-23 DIAGNOSIS — Z87891 Personal history of nicotine dependence: Secondary | ICD-10-CM

## 2019-05-23 DIAGNOSIS — L03116 Cellulitis of left lower limb: Secondary | ICD-10-CM | POA: Diagnosis present

## 2019-05-23 DIAGNOSIS — L03115 Cellulitis of right lower limb: Secondary | ICD-10-CM | POA: Diagnosis present

## 2019-05-23 DIAGNOSIS — M25512 Pain in left shoulder: Secondary | ICD-10-CM

## 2019-05-23 DIAGNOSIS — I10 Essential (primary) hypertension: Secondary | ICD-10-CM | POA: Diagnosis present

## 2019-05-23 LAB — TSH: TSH: 2.999 u[IU]/mL (ref 0.350–4.500)

## 2019-05-23 LAB — CBC WITH DIFFERENTIAL/PLATELET
Abs Immature Granulocytes: 0.03 10*3/uL (ref 0.00–0.07)
Basophils Absolute: 0 10*3/uL (ref 0.0–0.1)
Basophils Relative: 1 %
Eosinophils Absolute: 0.1 10*3/uL (ref 0.0–0.5)
Eosinophils Relative: 2 %
HCT: 33.8 % — ABNORMAL LOW (ref 39.0–52.0)
Hemoglobin: 10.9 g/dL — ABNORMAL LOW (ref 13.0–17.0)
Immature Granulocytes: 1 %
Lymphocytes Relative: 20 %
Lymphs Abs: 1 10*3/uL (ref 0.7–4.0)
MCH: 32.8 pg (ref 26.0–34.0)
MCHC: 32.2 g/dL (ref 30.0–36.0)
MCV: 101.8 fL — ABNORMAL HIGH (ref 80.0–100.0)
Monocytes Absolute: 0.6 10*3/uL (ref 0.1–1.0)
Monocytes Relative: 12 %
Neutro Abs: 3.2 10*3/uL (ref 1.7–7.7)
Neutrophils Relative %: 64 %
Platelets: 178 10*3/uL (ref 150–400)
RBC: 3.32 MIL/uL — ABNORMAL LOW (ref 4.22–5.81)
RDW: 14.2 % (ref 11.5–15.5)
WBC: 4.9 10*3/uL (ref 4.0–10.5)
nRBC: 0 % (ref 0.0–0.2)

## 2019-05-23 LAB — COMPREHENSIVE METABOLIC PANEL
ALT: 21 U/L (ref 0–44)
AST: 29 U/L (ref 15–41)
Albumin: 3.6 g/dL (ref 3.5–5.0)
Alkaline Phosphatase: 50 U/L (ref 38–126)
Anion gap: 11 (ref 5–15)
BUN: 30 mg/dL — ABNORMAL HIGH (ref 8–23)
CO2: 27 mmol/L (ref 22–32)
Calcium: 8.9 mg/dL (ref 8.9–10.3)
Chloride: 105 mmol/L (ref 98–111)
Creatinine, Ser: 2.21 mg/dL — ABNORMAL HIGH (ref 0.61–1.24)
GFR calc Af Amer: 29 mL/min — ABNORMAL LOW (ref >60)
GFR calc non Af Amer: 25 mL/min — ABNORMAL LOW (ref >60)
Glucose, Bld: 108 mg/dL — ABNORMAL HIGH (ref 70–99)
Potassium: 4.1 mmol/L (ref 3.5–5.1)
Sodium: 143 mmol/L (ref 135–145)
Total Bilirubin: 0.7 mg/dL (ref 0.3–1.2)
Total Protein: 6.1 g/dL — ABNORMAL LOW (ref 6.5–8.1)

## 2019-05-23 LAB — HEMOGLOBIN A1C
Hgb A1c MFr Bld: 6 % — ABNORMAL HIGH (ref 4.8–5.6)
Mean Plasma Glucose: 125.5 mg/dL

## 2019-05-23 LAB — GLUCOSE, CAPILLARY: Glucose-Capillary: 117 mg/dL — ABNORMAL HIGH (ref 70–99)

## 2019-05-23 MED ORDER — FUROSEMIDE 10 MG/ML IJ SOLN
4.0000 mg/h | INTRAVENOUS | Status: DC
  Administered 2019-05-23: 4 mg/h via INTRAVENOUS
  Filled 2019-05-23 (×2): qty 25

## 2019-05-23 MED ORDER — SODIUM CHLORIDE 0.9 % IV SOLN: 250.0000 mL | INTRAVENOUS | Status: DC | PRN

## 2019-05-23 MED ORDER — ACETAMINOPHEN 325 MG PO TABS: 650.0000 mg | ORAL_TABLET | ORAL | Status: DC | PRN

## 2019-05-23 MED ORDER — ONDANSETRON HCL 4 MG/2ML IJ SOLN: 4.0000 mg | Freq: Four times a day (QID) | INTRAMUSCULAR | Status: DC | PRN

## 2019-05-23 MED ORDER — INSULIN ASPART 100 UNIT/ML ~~LOC~~ SOLN: 0.0000 [IU] | Freq: Every day | SUBCUTANEOUS | Status: DC

## 2019-05-23 MED ORDER — SODIUM CHLORIDE 0.9% FLUSH
3.0000 mL | Freq: Two times a day (BID) | INTRAVENOUS | Status: DC
  Administered 2019-05-26 – 2019-05-27 (×3): 3 mL via INTRAVENOUS

## 2019-05-23 MED ORDER — INSULIN ASPART 100 UNIT/ML ~~LOC~~ SOLN: 0.0000 [IU] | Freq: Three times a day (TID) | SUBCUTANEOUS | Status: DC

## 2019-05-23 MED ORDER — SODIUM CHLORIDE 0.9% FLUSH: 3.0000 mL | INTRAVENOUS | Status: DC | PRN

## 2019-05-23 NOTE — H&P (Signed)
History and Physical   Carlos [name] BWG:[id number] DOB: 14-Jul-1926 DOA: 05/23/2019  Referring MD/NP/PA: Dr. Holley Raring, nephrology  PCP: [name], MD   Outpatient Specialists: Dr. Holley Raring, nephrology  Patient coming from: Home  Chief Complaint: Increasing lower extremity edema  HPI: Carlos [name] is a 86 y.o. male with medical history significant of chronic kidney disease stage IV, hypertension, hyperlipidemia, diabetes, GERD, gout, peripheral vascular disease, coronary artery disease, diverticulosis, paroxysmal atrial fibrillation on warfarin who was seen by nephrology complaining of bilateral lower extremity edema that is worsening with shortness of breath.  Patient has gained about 10 pounds over the last month.  He also has mainly exertional dyspnea.  Findings are consistent with acute exacerbation of CHF.  He was also noted to have possible lower extremity cellulitis for which he is on Keflex.  Nephrology sent patient for direct admission and recommended IV Lasix drip.  He is to be seen in the morning by Dr. Candiss Norse from nephrology.  Patient denied any shortness of breath at rest.  No cough.  No wheezing.  He is remarkably awake alert and very sharp for a 86 year old..   Review of Systems: As per HPI otherwise 10 point review of systems negative.    Past Medical History:  Diagnosis Date  . Arthritis   . Atrial fibrillation ([place])   . CHF (congestive heart failure) ([place])   . Chronic kidney disease    kidney stones  . Coronary artery disease   . Diabetes mellitus without complication ([place])   . Diverticulosis   . GERD (gastroesophageal reflux disease)   . Gout   . Hyperlipidemia   . Hypertension   . Myocardial infarction ([place]) 2001  . Peripheral vascular disease ([place])   . Pneumothorax, left 1950    Past Surgical History:  Procedure Laterality Date  . CARDIAC CATHETERIZATION    . CARDIAC SURGERY     4 bypass  . CHOLECYSTECTOMY    . CORONARY ARTERY BYPASS GRAFT    .  CYSTOSCOPY W/ RETROGRADES N/A 12/03/2014   Procedure: CYSTOSCOPY WITH ATTEMPT FOR RETROGRADE PYELOGRAM;  Surgeon: [name], MD;  Location: [facility] ORS;  Service: Urology;  Laterality: N/A;  . CYSTOSCOPY WITH LITHOLAPAXY N/A 12/03/2014   Procedure: CYSTOSCOPY WITH LITHOLAPAXY WITH HOLMIUM LASER ;  Surgeon: [name], MD;  Location: [facility] ORS;  Service: Urology;  Laterality: N/A;  . FEMUR SURGERY    . GALLBLADDER SURGERY    . HERNIA REPAIR    . JOINT REPLACEMENT Right    Total Knee Replacement  . LUNG SURGERY Left   . REPLACEMENT TOTAL KNEE       reports that he quit smoking about 12 years ago. His smoking use included pipe. He has never used smokeless tobacco. He reports current alcohol use of about 14.0 standard drinks of alcohol per week. He reports that he does not use drugs.  Allergies  Allergen Reactions  . Morphine And Related Other (See Comments)    "nightmares"    Family History  Problem Relation Age of Onset  . Lung cancer Mother   . Tuberculosis Brother      Prior to Admission medications   Medication Sig Start Date End Date Taking? Authorizing Provider  allopurinol (ZYLOPRIM) 300 MG tablet Take 150 mg by mouth daily.  05/09/14   [provider]  aspirin EC 81 MG tablet Take 81 mg by mouth daily.     [provider]  atenolol (TENORMIN) 25 MG tablet TAKE 1 TABLET ONE  TIME DAILY FOR BLOOD PRESSURE 01/21/14   [provider]  finasteride (PROSCAR) 5 MG tablet Take 1 tablet (5 mg total) by mouth daily. 08/13/18   Billey Co, MD  Multiple Vitamin (MULTI-VITAMINS) TABS Take 1 tablet by mouth daily.     [provider]  Omega-3 Fatty Acids (FISH OIL) 1000 MG CAPS Take 1 capsule by mouth daily.    [provider]  omeprazole (PRILOSEC) 20 MG capsule Take 20 mg by mouth daily.  05/19/14   [provider]  simvastatin (ZOCOR) 40 MG tablet Take 40 mg by mouth daily at 6 PM.  07/07/14   [provider]   tamsulosin (FLOMAX) 0.4 MG CAPS capsule Take 1 capsule (0.4 mg total) by mouth daily. 08/13/18   Billey Co, MD  torsemide (DEMADEX) 20 MG tablet Take 20 mg by mouth daily.  10/03/13   [provider]  traMADol (ULTRAM) 50 MG tablet Take 50 mg by mouth every 6 (six) hours as needed.     [provider]  warfarin (COUMADIN) 3 MG tablet Take 3 mg by mouth daily at 6 PM.  05/02/14   [provider]  colchicine 0.6 MG tablet Take 0.6 mg by mouth as needed.   08/28/18  [provider]  pantoprazole (PROTONIX) 40 MG tablet Take by mouth. 12/29/14 08/28/18  [provider]    Physical Exam: Vitals:   05/23/19 1809 05/23/19 1900 05/23/19 1935 05/24/19 0015  BP:   (!) 146/73 137/71  Pulse:   93 91  Resp:   16 20  Temp:   97.9 F (36.6 C) 98.2 F (36.8 C)  TempSrc:   Oral Oral  SpO2:   98% 95%  Weight: 99.7 kg     Height:  5\' 7"  (1.702 m)        Constitutional: Stable, looks young for his age Vitals:   05/23/19 1809 05/23/19 1900 05/23/19 1935 05/24/19 0015  BP:   (!) 146/73 137/71  Pulse:   93 91  Resp:   16 20  Temp:   97.9 F (36.6 C) 98.2 F (36.8 C)  TempSrc:   Oral Oral  SpO2:   98% 95%  Weight: 99.7 kg     Height:  5\' 7"  (1.702 m)     Eyes: PERRL, lids and conjunctivae normal ENMT: Mucous membranes are moist. Posterior pharynx clear of any exudate or lesions.Normal dentition.  Neck: normal, supple, no masses, no thyromegaly Respiratory: Good air entry bilaterally with some basal crackles but no wheezing, normal respiratory effort. No accessory muscle use.  Cardiovascular: Irregularly irregular rate and rhythm, no murmurs / rubs / gallops.  3+ extremity edema. 2+ pedal pulses. No carotid bruits.  Abdomen: no tenderness, no masses palpated. No hepatosplenomegaly. Bowel sounds positive.  Musculoskeletal: no clubbing / cyanosis. No joint deformity upper and lower extremities. Good ROM, no contractures. Normal muscle tone.  Skin: no  rashes, lesions, ulcers. No induration, bilateral redness of the lower extremity possible cellulitis Neurologic: CN 2-12 grossly intact. Sensation intact, DTR normal. Strength 5/5 in all 4.  Psychiatric: Normal judgment and insight. Alert and oriented x 3. Normal mood.     Labs on Admission: I have personally reviewed following labs and imaging studies  CBC: Recent Labs  Lab 05/23/19 1847  WBC 4.9  NEUTROABS 3.2  HGB 10.9*  HCT 33.8*  MCV 101.8*  PLT 967   Basic Metabolic Panel: Recent Labs  Lab 05/23/19 1847  NA 143  K 4.1  CL 105  CO2 27  GLUCOSE 108*  BUN 30*  CREATININE 2.21*  CALCIUM 8.9   GFR: Estimated Creatinine Clearance: 24.5 mL/min (A) (by C-G formula based on SCr of 2.21 mg/dL (H)). Liver Function Tests: Recent Labs  Lab 05/23/19 1847  AST 29  ALT 21  ALKPHOS 50  BILITOT 0.7  PROT 6.1*  ALBUMIN 3.6   No results for input(s): LIPASE, AMYLASE in the last 168 hours. No results for input(s): AMMONIA in the last 168 hours. Coagulation Profile: Recent Labs  Lab 05/23/19 2321  INR 3.2*   Cardiac Enzymes: No results for input(s): CKTOTAL, CKMB, CKMBINDEX, TROPONINI in the last 168 hours. BNP (last 3 results) No results for input(s): PROBNP in the last 8760 hours. HbA1C: Recent Labs    05/23/19 1847  HGBA1C 6.0*   CBG: Recent Labs  Lab 05/23/19 2112  GLUCAP 117*   Lipid Profile: No results for input(s): CHOL, HDL, LDLCALC, TRIG, CHOLHDL, LDLDIRECT in the last 72 hours. Thyroid Function Tests: Recent Labs    05/23/19 1847  TSH 2.999   Anemia Panel: No results for input(s): VITAMINB12, FOLATE, FERRITIN, TIBC, IRON, RETICCTPCT in the last 72 hours. Urine analysis:    Component Value Date/Time   COLORURINE Yellow 12/12/2013 1512   APPEARANCEUR Clear 03/26/2015 1132   LABSPEC 1.006 12/12/2013 1512   PHURINE 5.0 12/12/2013 1512   GLUCOSEU Negative 03/26/2015 1132   GLUCOSEU Negative 12/12/2013 1512   HGBUR Negative 12/12/2013 1512    BILIRUBINUR Negative 03/26/2015 1132   BILIRUBINUR Negative 12/12/2013 1512   KETONESUR Negative 12/12/2013 1512   PROTEINUR Negative 03/26/2015 1132   PROTEINUR Negative 12/12/2013 1512   NITRITE Negative 03/26/2015 1132   NITRITE Negative 12/12/2013 1512   LEUKOCYTESUR Trace (A) 03/26/2015 1132   LEUKOCYTESUR Trace 12/12/2013 1512   Sepsis Labs: @LABRCNTIP (procalcitonin:4,lacticidven:4) )No results found for this or any previous visit (from the past 240 hour(s)).   Radiological Exams on Admission: No results found.  EKG: Independently reviewed.  It shows A. fib with a rate of 91, no significant ST changes  Assessment/Plan Principal Problem:   Acute on chronic combined systolic and diastolic CHF (congestive heart failure) (HCC) Active Problems:   Essential (primary) hypertension   AF (paroxysmal atrial fibrillation) (HCC)   Diabetes mellitus, type 2 (HCC)   Hyperlipidemia   CKD (chronic kidney disease), stage IV (HCC)     #1 acute exacerbation of CHF: Patient has reported systolic dysfunction.  Admit the patient and start Lasix drip due to chronic kidney disease.  We will started for milligrams per hour.  Titrate slowly.  Monitor urine  output.  Follow nephrology recommendations.  Get echocardiogram.  Continue other cardiac medications.  #2 chronic kidney disease stage IV: As per #1.  Follow nephrology recommendations.  #3 paroxysmal atrial fibrillation: Patient currently in A. fib.  Resume his warfarin and other medications.  Rate is controlled.  #4 diabetes: Blood sugar control with sliding scale insulin.  #5 hyperlipidemia: Continue with statin.  #6 hypertension: Resume home regimen.  #7 lower extremity cellulitis: Continue Keflex   DVT prophylaxis: Warfarin Code Status: Full code Family Communication: No family at bedside Disposition Plan: Home Consults called: Dr. Candiss Norse nephrology Admission status: Inpatient  Severity of Illness: The appropriate patient  status for this patient is INPATIENT. Inpatient status is judged to be reasonable and necessary in order to provide the required intensity of service to ensure the patient's safety. The patient's presenting symptoms, physical exam findings, and initial radiographic and laboratory data  in the context of their chronic comorbidities is felt to place them at high risk for further clinical deterioration. Furthermore, it is not anticipated that the patient will be medically stable for discharge from the hospital within 2 midnights of admission. The following factors support the patient status of inpatient.   " The patient's presenting symptoms include shortness of breath and lower extremity edema. " The worrisome physical exam findings include marked lower extremity edema. " The initial radiographic and laboratory data are worrisome because of no significant findings. " The chronic co-morbidities include CHF and chronic kidney disease stage IV.   * I certify that at the point of admission it is my clinical judgment that the patient will require inpatient hospital care spanning beyond 2 midnights from the point of admission due to high intensity of service, high risk for further deterioration and high frequency of surveillance required.Barbette Merino MD [facility] Pager [phone]  If 7PM-7AM, please contact night-coverage [email or web address] Password [facility]  05/24/2019, 12:45 AM

## 2019-05-24 ENCOUNTER — Other Ambulatory Visit: Payer: Medicare Other

## 2019-05-24 ENCOUNTER — Inpatient Hospital Stay
Admission: AD | Admit: 2019-05-24 | Discharge: 2019-05-24 | Disposition: A | Discharge status: Home / Self Care | Payer: Medicare Other | Source: Ambulatory Visit | Attending: Internal Medicine | Admitting: Internal Medicine

## 2019-05-24 DIAGNOSIS — I5043 Acute on chronic combined systolic (congestive) and diastolic (congestive) heart failure: Secondary | ICD-10-CM

## 2019-05-24 LAB — PROTIME-INR
INR: 3.2 — ABNORMAL HIGH (ref 0.8–1.2)
INR: 2.9 — ABNORMAL HIGH (ref 0.8–1.2)
Prothrombin Time: 32.9 seconds — ABNORMAL HIGH (ref 11.4–15.2)
Prothrombin Time: 30.1 seconds — ABNORMAL HIGH (ref 11.4–15.2)

## 2019-05-24 LAB — BASIC METABOLIC PANEL
Anion gap: 8 (ref 5–15)
BUN: 28 mg/dL — ABNORMAL HIGH (ref 8–23)
CO2: 29 mmol/L (ref 22–32)
Calcium: 8.7 mg/dL — ABNORMAL LOW (ref 8.9–10.3)
Chloride: 107 mmol/L (ref 98–111)
Creatinine, Ser: 2.01 mg/dL — ABNORMAL HIGH (ref 0.61–1.24)
GFR calc Af Amer: 33 mL/min — ABNORMAL LOW (ref >60)
GFR calc non Af Amer: 28 mL/min — ABNORMAL LOW (ref >60)
Glucose, Bld: 109 mg/dL — ABNORMAL HIGH (ref 70–99)
Potassium: 3.6 mmol/L (ref 3.5–5.1)
Sodium: 144 mmol/L (ref 135–145)

## 2019-05-24 LAB — SARS CORONAVIRUS 2 (TAT 6-24 HRS): SARS Coronavirus 2: NEGATIVE

## 2019-05-24 LAB — ECHOCARDIOGRAM COMPLETE
Height: 67 in
Weight: 3604.96 oz

## 2019-05-24 LAB — GLUCOSE, CAPILLARY
Glucose-Capillary: 104 mg/dL — ABNORMAL HIGH (ref 70–99)
Glucose-Capillary: 103 mg/dL — ABNORMAL HIGH (ref 70–99)
Glucose-Capillary: 88 mg/dL (ref 70–99)
Glucose-Capillary: 142 mg/dL — ABNORMAL HIGH (ref 70–99)

## 2019-05-24 LAB — MAGNESIUM: Magnesium: 2 mg/dL (ref 1.7–2.4)

## 2019-05-24 MED ORDER — PANTOPRAZOLE SODIUM 40 MG PO TBEC
40.0000 mg | DELAYED_RELEASE_TABLET | Freq: Every day | ORAL | Status: DC
  Administered 2019-05-24 – 2019-05-27 (×4): 40 mg via ORAL
  Filled 2019-05-24 (×4): qty 1

## 2019-05-24 MED ORDER — ALLOPURINOL 100 MG PO TABS: 150.0000 mg | ORAL_TABLET | Freq: Every day | ORAL | Status: DC

## 2019-05-24 MED ORDER — OMEGA-3-ACID ETHYL ESTERS 1 G PO CAPS
1.0000 g | ORAL_CAPSULE | Freq: Every day | ORAL | Status: DC
  Administered 2019-05-24 – 2019-05-27 (×4): 1 g via ORAL
  Filled 2019-05-24 (×4): qty 1

## 2019-05-24 MED ORDER — PERFLUTREN LIPID MICROSPHERE
1.0000 mL | INTRAVENOUS | Status: AC | PRN
  Administered 2019-05-24: 2 mL via INTRAVENOUS
  Filled 2019-05-24: qty 10

## 2019-05-24 MED ORDER — ALLOPURINOL 100 MG PO TABS
50.0000 mg | ORAL_TABLET | Freq: Every day | ORAL | Status: DC
  Administered 2019-05-24 – 2019-05-27 (×4): 50 mg via ORAL
  Filled 2019-05-24 (×4): qty 1

## 2019-05-24 MED ORDER — ADULT MULTIVITAMIN W/MINERALS CH
1.0000 | ORAL_TABLET | Freq: Every day | ORAL | Status: DC
  Administered 2019-05-24 – 2019-05-27 (×4): 1 via ORAL
  Filled 2019-05-24 (×4): qty 1

## 2019-05-24 MED ORDER — TAMSULOSIN HCL 0.4 MG PO CAPS
0.4000 mg | ORAL_CAPSULE | Freq: Every day | ORAL | Status: DC
  Administered 2019-05-24 – 2019-05-27 (×4): 0.4 mg via ORAL
  Filled 2019-05-24 (×4): qty 1

## 2019-05-24 MED ORDER — ASPIRIN EC 81 MG PO TBEC
81.0000 mg | DELAYED_RELEASE_TABLET | Freq: Every day | ORAL | Status: DC
  Administered 2019-05-24 – 2019-05-26 (×4): 81 mg via ORAL
  Filled 2019-05-24 (×4): qty 1

## 2019-05-24 MED ORDER — SIMVASTATIN 20 MG PO TABS
40.0000 mg | ORAL_TABLET | Freq: Every day | ORAL | Status: DC
  Administered 2019-05-24 – 2019-05-26 (×3): 40 mg via ORAL
  Filled 2019-05-24 (×4): qty 2

## 2019-05-24 MED ORDER — METOPROLOL SUCCINATE ER 25 MG PO TB24
25.0000 mg | ORAL_TABLET | Freq: Every day | ORAL | Status: DC
  Administered 2019-05-24 – 2019-05-26 (×3): 25 mg via ORAL
  Filled 2019-05-24 (×4): qty 1

## 2019-05-24 MED ORDER — LOSARTAN POTASSIUM 25 MG PO TABS
25.0000 mg | ORAL_TABLET | Freq: Every day | ORAL | Status: DC
  Administered 2019-05-24 – 2019-05-26 (×3): 25 mg via ORAL
  Filled 2019-05-24 (×4): qty 1

## 2019-05-24 MED ORDER — POTASSIUM CHLORIDE CRYS ER 20 MEQ PO TBCR
40.0000 meq | EXTENDED_RELEASE_TABLET | Freq: Once | ORAL | Status: AC
  Administered 2019-05-24: 40 meq via ORAL
  Filled 2019-05-24: qty 2

## 2019-05-24 MED ORDER — CEPHALEXIN 500 MG PO CAPS
500.0000 mg | ORAL_CAPSULE | Freq: Two times a day (BID) | ORAL | Status: DC
  Administered 2019-05-24 – 2019-05-27 (×8): 500 mg via ORAL
  Filled 2019-05-24 (×8): qty 1

## 2019-05-24 MED ORDER — FINASTERIDE 5 MG PO TABS
5.0000 mg | ORAL_TABLET | Freq: Every day | ORAL | Status: DC
  Administered 2019-05-24 – 2019-05-27 (×4): 5 mg via ORAL
  Filled 2019-05-24 (×4): qty 1

## 2019-05-24 NOTE — Progress Notes (Signed)
ANTICOAGULATION CONSULT NOTE - Initial Consult  Pharmacy Consult for warfarin Indication: atrial fibrillation  Allergies  Allergen Reactions  . Morphine And Related Other (See Comments)    "nightmares"    Patient Measurements: Height: 5\' 7"  (170.2 cm) Weight: 225 lb 5 oz (102.2 kg) IBW/kg (Calculated) : 66.1  Vital Signs: Temp: 98.1 F (36.7 C) (03/26 0428) Temp Source: Oral (03/26 0428) BP: 115/69 (03/26 0428) Pulse Rate: 69 (03/26 0428)  Labs: Recent Labs    05/23/19 1847 05/23/19 2321 05/24/19 0555  HGB 10.9*  --   --   HCT 33.8*  --   --   PLT 178  --   --   LABPROT  --  32.9* 30.1*  INR  --  3.2* 2.9*  CREATININE 2.21*  --  2.01*    Estimated Creatinine Clearance: 27.3 mL/min (A) (by C-G formula based on SCr of 2.01 mg/dL (H)).   Medical History: Past Medical History:  Diagnosis Date  . Arthritis   . Atrial fibrillation ([place])   . CHF (congestive heart failure) ([place])   . Chronic kidney disease    kidney stones  . Coronary artery disease   . Diabetes mellitus without complication ([place])   . Diverticulosis   . GERD (gastroesophageal reflux disease)   . Gout   . Hyperlipidemia   . Hypertension   . Myocardial infarction ([place]) 2001  . Peripheral vascular disease ([place])   . Pneumothorax, left 1950    Medications:  Scheduled:  . allopurinol  50 mg Oral Daily  . aspirin EC  81 mg Oral QHS  . cephALEXin  500 mg Oral Q12H  . finasteride  5 mg Oral Daily  . insulin aspart  0-5 Units Subcutaneous QHS  . insulin aspart  0-9 Units Subcutaneous TID WC  . losartan  25 mg Oral Daily  . metoprolol succinate  25 mg Oral Daily  . multivitamin with minerals  1 tablet Oral Daily  . omega-3 acid ethyl esters  1 g Oral Daily  . pantoprazole  40 mg Oral Daily  . simvastatin  40 mg Oral q1800  . sodium chloride flush  3 mL Intravenous Q12H  . tamsulosin  0.4 mg Oral Daily    Assessment: Patient here for increased lower extremity edema w/ h/o afib anticoagulated  w/ wafarin 3 mg daily and 1 mg as needed? Patient's INR is currently 3.2.  Goal of Therapy:  INR 2-3 Monitor platelets by anticoagulation protocol: Yes   Plan:  Will hold off on doses until INR can < 3. Will monitor daily CBC's and adjust per INR trends.  Tobie Lords, PharmD, BCPS Clinical Pharmacist 05/24/2019,7:16 AM

## 2019-05-24 NOTE — Progress Notes (Signed)
Gave patient earplugs d/t IV machine making "dripping noise". Barbaraann Faster, RN; 05/24/2019

## 2019-05-24 NOTE — Progress Notes (Signed)
PROGRESS NOTE    Carlos [name]  YTK:[id number] DOB: 10/24/1926 DOA: 05/23/2019 PCP: Carlos Hartigan, MD   Brief Narrative:   HPI: Carlos [name] is a 86 y.o. male with medical history significant of chronic kidney disease stage IV, hypertension, hyperlipidemia, diabetes, GERD, gout, peripheral vascular disease, coronary artery disease, diverticulosis, paroxysmal atrial fibrillation on warfarin who was seen by nephrology complaining of bilateral lower extremity edema that is worsening with shortness of breath.  Patient has gained about 10 pounds over the last month.  He also has mainly exertional dyspnea.  Findings are consistent with acute exacerbation of CHF.  He was also noted to have possible lower extremity cellulitis for which he is on Keflex.  Nephrology sent patient for direct admission and recommended IV Lasix drip.  He is to be seen in the morning by Carlos [name] from nephrology.  Patient denied any shortness of breath at rest.  No cough.  No wheezing.  He is remarkably awake alert and very sharp for a 86 year old  3/26: Patient seen and examined.  Remains on room air.  Net -1.3 L since admission.  Daily weights are unreliable as it indicates a gain and several kilograms since admission.  Patient feels well.  On room air.   Assessment & Plan:   Principal Problem:   Acute on chronic combined systolic and diastolic CHF (congestive heart failure) (HCC) Active Problems:   Essential (primary) hypertension   AF (paroxysmal atrial fibrillation) (HCC)   Diabetes mellitus, type 2 (HCC)   Hyperlipidemia   CKD (chronic kidney disease), stage IV (HCC)  Acute decompensated heart failure Previous ejection fraction is unknown Presentation consistent with ADH F Patient follows with Carlos [name] as outpatient Plan: Lasix drip currently Monitor replace electrolytes.K>4; Mg>2 Strict ins and outs Daily weights 2D echocardiogram Low-sodium diet Cardiology follow-up.  Will consider inpatient  consult versus outpatient follow-up depending on response to diuresis as well as repeat assessment of ejection fraction.  Chronic kidney disease stage IV Cardiorenal syndrome Baseline creatinine appears to be approximately 1.9 Patient's creatinine improved after initiation of Lasix drip Suspected cardiorenal syndrome Plan: Continue Lasix drip for now Monitor and replace electrolytes Follow nephrology recommendations   Paroxysmal atrial fibrillation No rate control agents, rate currently controlled Patient on warfarin, resumed Daily INR  Type 2 diabetes Sliding scale insulin Consider addition of long-acting Lantus after 24 hours of blood glucose monitoring  Hyperlipidemia Continue statin  Hypertension Continue Cozaar 25 mg daily Continue metoprolol succinate 25 mg daily Lasix GTT as above  Lower extremity cellulitis Likely superimposed on peripheral edema Recently started on Keflex as outpatient, will continue   DVT prophylaxis: Warfarin Code Status: Full Family Communication: Disposition Plan: Anticipate return to home.  Current barriers to discharge include fluid overloaded state.  Patient will need diuresis and return to euvolemia prior to discharge.  Consultants:   Nephrology- [facility] [place] [facility]  Procedures:   none  Antimicrobials:   none   Subjective: Patient seen and examined No complaints, feels well   Objective: Vitals:   05/24/19 0015 05/24/19 0428 05/24/19 0500 05/24/19 0948  BP: 137/71 115/69  125/71  Pulse: 91 69  98  Resp: 20 20  17   Temp: 98.2 F (36.8 C) 98.1 F (36.7 C)  98.5 F (36.9 C)  TempSrc: Oral Oral  Oral  SpO2: 95% 93%  95%  Weight:   102.2 kg   Height:        Intake/Output Summary (Last 24 hours) at 05/24/2019 1125 Last data filed  at 05/24/2019 0847 Gross per 24 hour  Intake 37.55 ml  Output 1350 ml  Net -1312.45 ml   Filed Weights   05/23/19 1809 05/24/19 0500  Weight: 99.7 kg 102.2 kg     Examination:  General exam: Appears calm and comfortable  Respiratory system: Bibasilar crackles, normal respiratory effort, room air Cardiovascular system: S1 & S2 heard, RRR. No JVD, murmurs, rubs, gallops or clicks.  3+ pitting edema bilateral lower extremities Gastrointestinal system: Abdomen is nondistended, soft and nontender. No organomegaly or masses felt. Normal bowel sounds heard. Central nervous system: Alert and oriented. No focal neurological deficits. Extremities: Symmetric 5 x 5 power. Skin: No rashes, lesions or ulcers Psychiatry: Judgement and insight appear normal. Mood & affect appropriate.     Data Reviewed: I have personally reviewed following labs and imaging studies  CBC: Recent Labs  Lab 05/23/19 1847  WBC 4.9  NEUTROABS 3.2  HGB 10.9*  HCT 33.8*  MCV 101.8*  PLT 382   Basic Metabolic Panel: Recent Labs  Lab 05/23/19 1847 05/24/19 0555  NA 143 144  K 4.1 3.6  CL 105 107  CO2 27 29  GLUCOSE 108* 109*  BUN 30* 28*  CREATININE 2.21* 2.01*  CALCIUM 8.9 8.7*  MG  --  2.0   GFR: Estimated Creatinine Clearance: 27.3 mL/min (A) (by C-G formula based on SCr of 2.01 mg/dL (H)). Liver Function Tests: Recent Labs  Lab 05/23/19 1847  AST 29  ALT 21  ALKPHOS 50  BILITOT 0.7  PROT 6.1*  ALBUMIN 3.6   No results for input(s): LIPASE, AMYLASE in the last 168 hours. No results for input(s): AMMONIA in the last 168 hours. Coagulation Profile: Recent Labs  Lab 05/23/19 2321 05/24/19 0555  INR 3.2* 2.9*   Cardiac Enzymes: No results for input(s): CKTOTAL, CKMB, CKMBINDEX, TROPONINI in the last 168 hours. BNP (last 3 results) No results for input(s): PROBNP in the last 8760 hours. HbA1C: Recent Labs    05/23/19 1847  HGBA1C 6.0*   CBG: Recent Labs  Lab 05/23/19 2112 05/24/19 0804  GLUCAP 117* 104*   Lipid Profile: No results for input(s): CHOL, HDL, LDLCALC, TRIG, CHOLHDL, LDLDIRECT in the last 72 hours. Thyroid Function  Tests: Recent Labs    05/23/19 1847  TSH 2.999   Anemia Panel: No results for input(s): VITAMINB12, FOLATE, FERRITIN, TIBC, IRON, RETICCTPCT in the last 72 hours. Sepsis Labs: No results for input(s): PROCALCITON, LATICACIDVEN in the last 168 hours.  Recent Results (from the past 240 hour(s))  SARS CORONAVIRUS 2 (TAT 6-24 HRS) Nasopharyngeal Nasopharyngeal Swab     Status: None   Collection Time: 05/23/19  8:30 PM   Specimen: Nasopharyngeal Swab  Result Value Ref Range Status   SARS Coronavirus 2 NEGATIVE NEGATIVE Final    Comment: (NOTE) SARS-CoV-2 target nucleic acids are NOT DETECTED. The SARS-CoV-2 RNA is generally detectable in upper and lower respiratory specimens during the acute phase of infection. Negative results do not preclude SARS-CoV-2 infection, do not rule out co-infections with other pathogens, and should not be used as the sole basis for treatment or other patient management decisions. Negative results must be combined with clinical observations, patient history, and epidemiological information. The expected result is Negative. Fact Sheet for Patients: SugarRoll.be Fact Sheet for Healthcare Providers: https://www.woods-mathews.com/ This test is not yet approved or cleared by the [place] FDA and  has been authorized for detection and/or diagnosis of SARS-CoV-2 by FDA under an Emergency Use Authorization (EUA). This EUA will  remain  in effect (meaning this test can be used) for the duration of the COVID-19 declaration under Section 56 4(b)(1) of the Act, 21 U.S.C. section 360bbb-3(b)(1), unless the authorization is terminated or revoked sooner. Performed at [facility], [place] [address], [place], [place] [address]          Radiology Studies: No results found.      Scheduled Meds: . allopurinol  50 mg Oral Daily  . aspirin EC  81 mg Oral QHS  . cephALEXin  500 mg Oral Q12H  . finasteride  5 mg  Oral Daily  . insulin aspart  0-5 Units Subcutaneous QHS  . insulin aspart  0-9 Units Subcutaneous TID WC  . losartan  25 mg Oral Daily  . metoprolol succinate  25 mg Oral Daily  . multivitamin with minerals  1 tablet Oral Daily  . omega-3 acid ethyl esters  1 g Oral Daily  . pantoprazole  40 mg Oral Daily  . simvastatin  40 mg Oral q1800  . sodium chloride flush  3 mL Intravenous Q12H  . tamsulosin  0.4 mg Oral Daily   Continuous Infusions: . sodium chloride    . furosemide (LASIX) infusion 4 mg/hr (05/24/19 0522)     LOS: 1 day    Time spent: 35 minutes    Sidney Ace, MD [facility] Pager [phone]  If 7PM-7AM, please contact night-coverage 05/24/2019, 11:25 AM

## 2019-05-24 NOTE — Progress Notes (Signed)
*  PRELIMINARY RESULTS* Echocardiogram 2D Echocardiogram has been performed.  Carlos [name] 05/24/2019, 12:16 PM

## 2019-05-24 NOTE — Evaluation (Signed)
Physical Therapy Evaluation Patient Details Name: Carlos [name] MRN: [id number] DOB: 1926-09-04 Today's Date: 05/24/2019   History of Present Illness  Carlos [name] is a 29yoM who comes to [facility] on 3/25 c LEE and SOB. Pt admitted for CHF exacerbation. PMH: chronic kidney disease stage IV, hypertension, hyperlipidemia, diabetes, GERD, gout, peripheral vascular disease, coronary artery disease, diverticulosis, paroxysmal atrial fibrillation on warfarin.  Clinical Impression  Pt admitted with above diagnosis. Pt currently with functional limitations due to the deficits listed below (see "PT Problem List"). Upon entry, pt in bed, awake and agreeable to participate. The pt is alert and oriented x4, pleasant, conversational, and generally a good historian. Pt able to perform all mobility at supervision level. Pt able to AMB to bathroom using wall for support, then 251ft around unit with his 4WW. Pt has obvious LEE with moderate erythema, glossy skin. Pt reports heaviness in legs, Functional mobility assessment demonstrates increased effort/time requirements, poor tolerance, and need for physical assistance, whereas the patient performed these at a higher level of independence PTA. Pt has more SOB with AMB, but O2 sats are >93% on room air. Pt will benefit from skilled PT intervention to increase independence and safety with basic mobility in preparation for discharge to the venue listed below.       Follow Up Recommendations Home health PT    Equipment Recommendations  None recommended by PT    Recommendations for Other Services       Precautions / Restrictions Precautions Precautions: Fall      Mobility  Bed Mobility Overal bed mobility: Modified Independent                Transfers Overall transfer level: Modified independent Equipment used: 4-wheeled walker                Ambulation/Gait Ambulation/Gait assistance: Min guard Gait Distance (Feet): 200 Feet Assistive device:  4-wheeled walker Gait Pattern/deviations: Step-through pattern Gait velocity: 0.28m/s   General Gait Details: heavier in the legs, more SOB, but O2 sats WNL on room air.  Stairs            Wheelchair Mobility    Modified Rankin (Stroke Patients Only)       Balance Overall balance assessment: History of Falls;Modified Independent;Mild deficits observed, not formally tested                                           Pertinent Vitals/Pain Pain Assessment: No/denies pain    Home Living Family/patient expects to be discharged to:: Private residence Living Arrangements: Alone Available Help at Discharge: Friend(s)   Home Access: Stairs to enter Entrance Stairs-Rails: Right(keeps a rollator in house, garage, and car) Technical brewer of Steps: [place]: One level Home Equipment: Environmental consultant - 4 wheels;Grab bars - tub/shower;Grab bars - toilet      Prior Function Level of Independence: Independent with assistive device(s)         Comments: 4ww due to chornic balance issues; reports chornic falls issues 1 in past 6 months; Family is out of state.     Hand Dominance        Extremity/Trunk Assessment   Upper Extremity Assessment Upper Extremity Assessment: Generalized weakness;Overall [facility] for tasks assessed    Lower Extremity Assessment Lower Extremity Assessment: Generalized weakness;Overall [facility] for tasks assessed    Cervical / Trunk Assessment Cervical / Trunk Assessment: Normal  Communication   Communication: No difficulties  Cognition Arousal/Alertness: Awake/alert Behavior During Therapy: WFL for tasks assessed/performed Overall Cognitive Status: Within Functional Limits for tasks assessed                                        General Comments      Exercises     Assessment/Plan    PT Assessment Patient needs continued PT services  PT Problem List Decreased strength;Decreased range of motion;Decreased  activity tolerance;Decreased balance;Decreased mobility       PT Treatment Interventions DME instruction;Gait training;Stair training;Functional mobility training;Therapeutic activities;Therapeutic exercise    PT Goals (Current goals can be found in the Care Plan section)  Acute Rehab PT Goals Patient Stated Goal: regain baseline mobility, independence PT Goal Formulation: With patient Time For Goal Achievement: 06/07/19 Potential to Achieve Goals: Good    Frequency Min 2X/week   Barriers to discharge Decreased caregiver support      Co-evaluation               AM-PAC PT "6 Clicks" Mobility  Outcome Measure Help needed turning from your back to your side while in a flat bed without using bedrails?: None Help needed moving from lying on your back to sitting on the side of a flat bed without using bedrails?: None Help needed moving to and from a bed to a chair (including a wheelchair)?: A Little Help needed standing up from a chair using your arms (e.g., wheelchair or bedside chair)?: A Little Help needed to walk in hospital room?: A Little Help needed climbing 3-5 steps with a railing? : A Little 6 Click Score: 20    End of Session Equipment Utilized During Treatment: Gait belt Activity Tolerance: Patient tolerated treatment well;Patient limited by fatigue Patient left: in bed;with nursing/sitter in room;with call bell/phone within reach Nurse Communication: Mobility status PT Visit Diagnosis: Unsteadiness on feet (R26.81);Other abnormalities of gait and mobility (R26.89);Repeated falls (R29.6);Difficulty in walking, not elsewhere classified (R26.2)    Time: 1339-1400 PT Time Calculation (min) (ACUTE ONLY): 21 min   Charges:   PT Evaluation $PT Eval Low Complexity: 1 Low PT Treatments $Therapeutic Exercise: 8-22 mins       3:07 PM, 05/24/19 Etta Grandchild, PT, DPT Physical Therapist - [facility]  [phone] ([place])    Candler C 05/24/2019, 3:02 PM

## 2019-05-24 NOTE — TOC Initial Note (Signed)
Transition of Care [facility]) - Initial/Assessment Note    Patient Details  Name: Carlos [name] MRN: [id number] Date of Birth: 19-Jun-1926  Transition of Care [facility]) CM/SW Contact:    Beverly Sessions, RN Phone Number: 05/24/2019, 10:45 AM  Clinical Narrative:                 Patient admitted from home with CHF  Patient states he leaves at home alone.  Patient is A&O x4.  States he has children, but the closest one lives in [place].  PCP Feldpausch -  Drives himself to appointments Pharmacy [facility].  Denies any issues obtaining medications  Patient states that he has a rollator and walking sticks in the home.    Patient states he has a yearly RN visit from [place].   Hasn't ever had home health services.  Has been to rehab in the past, can not recall the facility names.  PT eval pending.  Patient agreeable to discuss home health VS snf depending on PT recommendations    Expected Discharge Plan: [place] Barriers to Discharge: Continued Medical Work up   Patient Goals and CMS Choice        Expected Discharge Plan and Services Expected Discharge Plan: [place]       Living arrangements for the past 2 months: Single Family Home                                      Prior Living Arrangements/Services Living arrangements for the past 2 months: Single Family Home Lives with:: Self Patient language and need for interpreter reviewed:: Yes Do you feel safe going back to the place where you live?: Yes      Need for Family Participation in Patient Care: No (Comment) Care giver support system in place?: No (comment) Current home services: DME Criminal Activity/Legal Involvement Pertinent to Current Situation/Hospitalization: No - Comment as needed  Activities of Daily Living Home Assistive Devices/Equipment: Gilford Rile (specify type) ADL Screening (condition at time of admission) Patient's cognitive ability adequate to safely  complete daily activities?: Yes Is the patient deaf or have difficulty hearing?: Yes Does the patient have difficulty seeing, even when wearing glasses/contacts?: No Does the patient have difficulty concentrating, remembering, or making decisions?: No Patient able to express need for assistance with ADLs?: Yes Does the patient have difficulty dressing or bathing?: No Independently performs ADLs?: Yes (appropriate for developmental age) Does the patient have difficulty walking or climbing stairs?: Yes Weakness of Legs: Both Weakness of Arms/Hands: Both  Permission Sought/Granted                  Emotional Assessment Appearance:: Appears stated age Attitude/Demeanor/Rapport: Gracious Affect (typically observed): Accepting Orientation: : Oriented to Self, Oriented to Place, Oriented to  Time, Oriented to Situation   Psych Involvement: No (comment)  Admission diagnosis:  Acute on chronic combined systolic and diastolic CHF (congestive heart failure) ([place]) [I50.43] Patient Active Problem List   Diagnosis Date Noted  . Acute on chronic combined systolic and diastolic CHF (congestive heart failure) ([place]) 05/23/2019  . Acute pain of left shoulder 09/08/2015  . CKD (chronic kidney disease), stage IV ([place]) 12/24/2014  . Bradycardia 12/02/2014  . TI (tricuspid incompetence) 10/21/2014  . DD (diverticular disease) 07/30/2014  . Essential (primary) hypertension 07/30/2014  . Tuberculosis 07/30/2014  . Hyperlipidemia 07/30/2014  .  Chronic systolic CHF (congestive heart failure), NYHA class 2 ([place]) 02/12/2014  . AF (paroxysmal atrial fibrillation) ([place]) 02/12/2014  . Paroxysmal A-fib ([place]) 02/12/2014  . Accumulation of fluid in tissues 07/24/2013  . Acid reflux 07/24/2013  . Gout 07/24/2013  . Diabetes mellitus, type 2 ([place]) 07/24/2013  . Type 2 diabetes mellitus with renal manifestations, controlled ([place]) 07/24/2013  . Peripheral blood vessel disorder ([place]) 07/19/2013  . PVD  (peripheral vascular disease) ([place]) 07/19/2013  . Combined fat and carbohydrate induced hyperlipemia 05/21/2013  . Encounter for therapeutic drug monitoring 12/18/2012  . Chronic kidney disease (CKD), stage IV (severe) ([place]) 12/18/2012  . Arteriosclerosis of coronary artery 12/18/2012  . Decreased potassium in the blood 12/18/2012  . H/O coronary artery bypass surgery 12/18/2012  . H/O total knee replacement 12/18/2012  . Encounter for therapeutic drug level monitoring 12/18/2012   PCP:  [name], MD Pharmacy:   [place], [place] - [address] [place] [address] Phone: [phone] Fax: [phone]  [facility] - [place], [place] [address] Phone: [phone] Fax: [phone]     Social Determinants of Health (SDOH) Interventions    Readmission Risk Interventions No flowsheet data found.

## 2019-05-24 NOTE — Progress Notes (Signed)
[facility], [place] 05/24/19  Subjective:   LOS: 1 03/25 0701 - 03/26 0700 In: 37.6 [I.V.:37.6] Out: 1100 [Urine:1100]  Patient known to our practice from outpatient follow-up Admitted for volume overload Patient reports that last few weeks he has been eating out as he lives by himself and was not able to cook Placed on IV Lasix infusion with good response Walking around in the hallways with physical therapist   Objective:  Vital signs in last 24 hours:  Temp:  [97.9 F (36.6 C)-98.5 F (36.9 C)] 98.1 F (36.7 C) (03/26 1217) Pulse Rate:  [60-98] 85 (03/26 1217) Resp:  [15-20] 15 (03/26 1217) BP: (115-146)/(60-112) 139/67 (03/26 1217) SpO2:  [92 %-98 %] 95 % (03/26 1217) Weight:  [99.7 kg-102.2 kg] 102.2 kg (03/26 0500)  Weight change:  Filed Weights   05/23/19 1809 05/24/19 0500  Weight: 99.7 kg 102.2 kg    Intake/Output:    Intake/Output Summary (Last 24 hours) at 05/24/2019 1400 Last data filed at 05/24/2019 0847 Gross per 24 hour  Intake 37.55 ml  Output 1350 ml  Net -1312.45 ml     Physical Exam: General:  No acute distress, sitting up on the side of the bed  HEENT  anicteric, moist oral mucous membrane  Pulm/lungs  lungs are clear to auscultation  CVS/Heart  no rub  Abdomen:   Soft, nontender  Extremities:  2+ pitting edema bilaterally  Neurologic:  Alert, oriented  Skin:  Warm, dry, erythema over lower legs          Basic Metabolic Panel:  Recent Labs  Lab 05/23/19 1847 05/24/19 0555  NA 143 144  K 4.1 3.6  CL 105 107  CO2 27 29  GLUCOSE 108* 109*  BUN 30* 28*  CREATININE 2.21* 2.01*  CALCIUM 8.9 8.7*  MG  --  2.0     CBC: Recent Labs  Lab 05/23/19 1847  WBC 4.9  NEUTROABS 3.2  HGB 10.9*  HCT 33.8*  MCV 101.8*  PLT 178     No results found for: HEPBSAG, HEPBSAB, HEPBIGM    Microbiology:  Recent Results (from the past 240 hour(s))  SARS CORONAVIRUS 2 (TAT 6-24 HRS) Nasopharyngeal  Nasopharyngeal Swab     Status: None   Collection Time: 05/23/19  8:30 PM   Specimen: Nasopharyngeal Swab  Result Value Ref Range Status   SARS Coronavirus 2 NEGATIVE NEGATIVE Final    Comment: (NOTE) SARS-CoV-2 target nucleic acids are NOT DETECTED. The SARS-CoV-2 RNA is generally detectable in upper and lower respiratory specimens during the acute phase of infection. Negative results do not preclude SARS-CoV-2 infection, do not rule out co-infections with other pathogens, and should not be used as the sole basis for treatment or other patient management decisions. Negative results must be combined with clinical observations, patient history, and epidemiological information. The expected result is Negative. Fact Sheet for Patients: SugarRoll.be Fact Sheet for Healthcare Providers: https://www.woods-mathews.com/ This test is not yet approved or cleared by the [place] FDA and  has been authorized for detection and/or diagnosis of SARS-CoV-2 by FDA under an Emergency Use Authorization (EUA). This EUA will remain  in effect (meaning this test can be used) for the duration of the COVID-19 declaration under Section 56 4(b)(1) of the Act, 21 U.S.C. section 360bbb-3(b)(1), unless the authorization is terminated or revoked sooner. Performed at [facility], [place] [address] [place], [place] [address]     Coagulation Studies: Recent Labs    05/23/19  2321 05/24/19 0555  LABPROT 32.9* 30.1*  INR 3.2* 2.9*    Urinalysis: No results for input(s): COLORURINE, LABSPEC, PHURINE, GLUCOSEU, HGBUR, BILIRUBINUR, KETONESUR, PROTEINUR, UROBILINOGEN, NITRITE, LEUKOCYTESUR in the last 72 hours.  Invalid input(s): APPERANCEUR    Imaging: No results found.   Medications:   . sodium chloride    . furosemide (LASIX) infusion 4 mg/hr (05/24/19 0522)   . allopurinol  50 mg Oral Daily  . aspirin EC  81 mg Oral QHS  . cephALEXin  500 mg Oral  Q12H  . finasteride  5 mg Oral Daily  . insulin aspart  0-5 Units Subcutaneous QHS  . insulin aspart  0-9 Units Subcutaneous TID WC  . losartan  25 mg Oral Daily  . metoprolol succinate  25 mg Oral Daily  . multivitamin with minerals  1 tablet Oral Daily  . omega-3 acid ethyl esters  1 g Oral Daily  . pantoprazole  40 mg Oral Daily  . simvastatin  40 mg Oral q1800  . sodium chloride flush  3 mL Intravenous Q12H  . tamsulosin  0.4 mg Oral Daily   sodium chloride, acetaminophen, ondansetron (ZOFRAN) IV, sodium chloride flush  Assessment/ Plan:  86 y.o. male with medical problems and past medical history of  Atrial fibrillation Type 2 diabetes Gout Hypertension GERD Coronary disease/MI Peripheral vascular disease Congestive heart failure.  2D echo May 24, 2019: LVEF 60 to 03%, grade 1 diastolic dysfunction   Principal Problem:   Acute on chronic combined systolic and diastolic CHF (congestive heart failure) (HCC) Active Problems:   Essential (primary) hypertension   AF (paroxysmal atrial fibrillation) (HCC)   Diabetes mellitus, type 2 (HCC)   Hyperlipidemia   CKD (chronic kidney disease), stage IV ([place])   #. CKD st 4 with volume overload Baseline creatinine of 1.94/GFR 30 from April 02, 2019.  Usual GFR range 26-29 Recent Labs    05/23/19 1847 05/24/19 0555  CREATININE 2.21* 2.01*  agree with iv lasix infusion Albumin 3.6 on 05/23/2019 BP acceptable Avoid hypotension Assess response to IV Lasix infusion.  May be able to change to oral torsemide tomorrow Discussed with patient to follow low-salt diet and avoid restaurant food  #. Anemia of CKD  Lab Results  Component Value Date   HGB 10.9 (L) 05/23/2019  Hemoglobin in acceptable range.  No indication for Epogen at present    #. Diabetes type 2 with CKD Hgb A1c MFr Bld (%)  Date Value  05/23/2019 6.0 (H)     LOS: [place] 3/26/20212:00 PM  [facility] [place],  [place]

## 2019-05-25 LAB — BASIC METABOLIC PANEL
Anion gap: 11 (ref 5–15)
BUN: 28 mg/dL — ABNORMAL HIGH (ref 8–23)
CO2: 28 mmol/L (ref 22–32)
Calcium: 9 mg/dL (ref 8.9–10.3)
Chloride: 104 mmol/L (ref 98–111)
Creatinine, Ser: 1.91 mg/dL — ABNORMAL HIGH (ref 0.61–1.24)
GFR calc Af Amer: 35 mL/min — ABNORMAL LOW (ref >60)
GFR calc non Af Amer: 30 mL/min — ABNORMAL LOW (ref >60)
Glucose, Bld: 97 mg/dL (ref 70–99)
Potassium: 3.5 mmol/L (ref 3.5–5.1)
Sodium: 143 mmol/L (ref 135–145)

## 2019-05-25 LAB — GLUCOSE, CAPILLARY
Glucose-Capillary: 103 mg/dL — ABNORMAL HIGH (ref 70–99)
Glucose-Capillary: 89 mg/dL (ref 70–99)
Glucose-Capillary: 101 mg/dL — ABNORMAL HIGH (ref 70–99)
Glucose-Capillary: 120 mg/dL — ABNORMAL HIGH (ref 70–99)

## 2019-05-25 LAB — PROTIME-INR
INR: 2.2 — ABNORMAL HIGH (ref 0.8–1.2)
Prothrombin Time: 24.4 seconds — ABNORMAL HIGH (ref 11.4–15.2)

## 2019-05-25 LAB — MAGNESIUM: Magnesium: 2.2 mg/dL (ref 1.7–2.4)

## 2019-05-25 MED ORDER — POTASSIUM CHLORIDE CRYS ER 20 MEQ PO TBCR
60.0000 meq | EXTENDED_RELEASE_TABLET | Freq: Once | ORAL | Status: AC
  Administered 2019-05-25: 60 meq via ORAL
  Filled 2019-05-25: qty 3

## 2019-05-25 MED ORDER — WARFARIN - PHARMACIST DOSING INPATIENT: Freq: Every day | Status: DC

## 2019-05-25 MED ORDER — WARFARIN SODIUM 3 MG PO TABS
3.0000 mg | ORAL_TABLET | Freq: Once | ORAL | Status: AC
  Administered 2019-05-25: 3 mg via ORAL
  Filled 2019-05-25: qty 1

## 2019-05-25 NOTE — Progress Notes (Signed)
ANTICOAGULATION CONSULT NOTE - Initial Consult  Pharmacy Consult for warfarin Indication: atrial fibrillation  Allergies  Allergen Reactions  . Morphine And Related Other (See Comments)    "nightmares"    Patient Measurements: Height: 5\' 7"  (170.2 cm) Weight: 222 lb 0.1 oz (100.7 kg) IBW/kg (Calculated) : 66.1  Vital Signs: Temp: 97.8 F (36.6 C) (03/27 0427) Temp Source: Oral (03/27 0427) BP: 126/70 (03/27 0427) Pulse Rate: 92 (03/27 0427)  Labs: Recent Labs    05/23/19 1847 05/23/19 2321 05/24/19 0555 05/25/19 0457  HGB 10.9*  --   --   --   HCT 33.8*  --   --   --   PLT 178  --   --   --   LABPROT  --  32.9* 30.1* 24.4*  INR  --  3.2* 2.9* 2.2*  CREATININE 2.21*  --  2.01* 1.91*    Estimated Creatinine Clearance: 28.5 mL/min (A) (by C-G formula based on SCr of 1.91 mg/dL (H)).   Medical History: Past Medical History:  Diagnosis Date  . Arthritis   . Atrial fibrillation ([place])   . CHF (congestive heart failure) ([place])   . Chronic kidney disease    kidney stones  . Coronary artery disease   . Diabetes mellitus without complication ([place])   . Diverticulosis   . GERD (gastroesophageal reflux disease)   . Gout   . Hyperlipidemia   . Hypertension   . Myocardial infarction ([place]) 2001  . Peripheral vascular disease ([place])   . Pneumothorax, left 1950    Medications:  Scheduled:  . allopurinol  50 mg Oral Daily  . aspirin EC  81 mg Oral QHS  . cephALEXin  500 mg Oral Q12H  . finasteride  5 mg Oral Daily  . insulin aspart  0-5 Units Subcutaneous QHS  . insulin aspart  0-9 Units Subcutaneous TID WC  . losartan  25 mg Oral Daily  . metoprolol succinate  25 mg Oral Daily  . multivitamin with minerals  1 tablet Oral Daily  . omega-3 acid ethyl esters  1 g Oral Daily  . pantoprazole  40 mg Oral Daily  . potassium chloride  60 mEq Oral Once  . simvastatin  40 mg Oral q1800  . sodium chloride flush  3 mL Intravenous Q12H  . tamsulosin  0.4 mg Oral Daily     Assessment: Patient here for increased lower extremity edema w/ h/o afib anticoagulated w/ wafarin 3 mg daily and 1 mg as needed? Patient's INR is currently 3.2.  Date INR Warfarin Dose  3/25 3.2 Held  3/26 2.9 Held  3/27 2.2 3 mg     Goal of Therapy:  INR 2-3 Monitor platelets by anticoagulation protocol: Yes   Plan:  INR is therapeutic. Predict INRwill continue as warfarin was Held yesterday. Plan to give warfarin 3 mg (home dose). Daily INR ordered.  Will monitor CBC every 3 days. and adjust per INR trends.  Eleonore Chiquito, PharmD, BCPS Clinical Pharmacist 05/25/2019,7:55 AM

## 2019-05-25 NOTE — Progress Notes (Signed)
Carlos [name]  MRN: [id number]  DOB/AGE: 05-11-1926 86 y.o.  Primary Care Physician:Feldpausch, Chrissie Noa, MD  Admit date: 05/23/2019  Chief Complaint: No chief complaint on file.   S-Pt presented on  05/23/2019 with No chief complaint on file. . Patient offers no specific complaints. Patient says he is feeling better than before. 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  Medications . allopurinol  50 mg Oral Daily  . aspirin EC  81 mg Oral QHS  . cephALEXin  500 mg Oral Q12H  . finasteride  5 mg Oral Daily  . insulin aspart  0-5 Units Subcutaneous QHS  . insulin aspart  0-9 Units Subcutaneous TID WC  . losartan  25 mg Oral Daily  . metoprolol succinate  25 mg Oral Daily  . multivitamin with minerals  1 tablet Oral Daily  . omega-3 acid ethyl esters  1 g Oral Daily  . pantoprazole  40 mg Oral Daily  . simvastatin  40 mg Oral q1800  . sodium chloride flush  3 mL Intravenous Q12H  . tamsulosin  0.4 mg Oral Daily  . warfarin  3 mg Oral ONCE-1600  . Warfarin - Pharmacist Dosing Inpatient   Does not apply q1800         LOV:FIEPP from the symptoms mentioned above,there are no other symptoms referable to all systems reviewed.  Physical Exam: Vital signs in last 24 hours: Temp:  [97.8 F (36.6 C)-98.1 F (36.7 C)] 97.8 F (36.6 C) (03/27 0939) Pulse Rate:  [65-92] 91 (03/27 0939) Resp:  [15-20] 16 (03/27 0939) BP: (118-148)/(45-70) 118/45 (03/27 0939) SpO2:  [93 %-96 %] 94 % (03/27 0939) Weight:  [100.7 kg] 100.7 kg (03/27 0534) Weight change: 0.999 kg Last BM Date: 05/24/19  Intake/Output from previous day: 03/26 0701 - 03/27 0700 In: 86.5 [I.V.:86.5] Out: 2050 [Urine:2050] No intake/output data recorded.   Physical Exam: General- pt is awake,alert, oriented to time place and person Resp- No acute REsp distress, CTA B/L NO Rhonchi CVS- S1S2 regular in rate and rhythm GIT- BS+, soft, NT,  ND EXT- 1+ LE Edema, Cyanosis   Lab Results: CBC Recent Labs    05/23/19 1847  WBC 4.9  HGB 10.9*  HCT 33.8*  PLT 178    BMET Recent Labs    05/24/19 0555 05/25/19 0457  NA 144 143  K 3.6 3.5  CL 107 104  CO2 29 28  GLUCOSE 109* 97  BUN 28* 28*  CREATININE 2.01* 1.91*  CALCIUM 8.7* 9.0    Creat trend 2021 1.9--2.2 2016  1.9 2015  1.8--2.3   MICRO Recent Results (from the past 240 hour(s))  SARS CORONAVIRUS 2 (TAT 6-24 HRS) Nasopharyngeal Nasopharyngeal Swab     Status: None   Collection Time: 05/23/19  8:30 PM   Specimen: Nasopharyngeal Swab  Result Value Ref Range Status   SARS Coronavirus 2 NEGATIVE NEGATIVE Final    Comment: (NOTE) SARS-CoV-2 target nucleic acids are NOT DETECTED. The SARS-CoV-2 RNA is generally detectable in upper and lower respiratory specimens during the acute phase of infection. Negative results do not preclude SARS-CoV-2 infection, do not rule out co-infections with other pathogens, and should not be used as the sole basis for treatment or other patient management decisions. Negative results must be combined with clinical observations, patient history, and epidemiological information. The expected result is Negative. Fact Sheet for Patients: SugarRoll.be Fact Sheet for Healthcare Providers: https://www.woods-mathews.com/  This test is not yet approved or cleared by the [place] and  has been authorized for detection and/or diagnosis of SARS-CoV-2 by FDA under an Emergency Use Authorization (EUA). This EUA will remain  in effect (meaning this test can be used) for the duration of the COVID-19 declaration under Section 56 4(b)(1) of the Act, 21 U.S.C. section 360bbb-3(b)(1), unless the authorization is terminated or revoked sooner. Performed at [facility], [place] [address] [place], [place] [address]       Lab Results  Component Value Date   CALCIUM 9.0 05/25/2019                Impression:  86 y.o. male with medical problems and past medical history of Atrial fibrillation,Type 2 diabetes,Gout,Hypertension,GERD,Coronary disease,Peripheral vascular disease Congestive heart failure.  2D echo May 24, 2019: LVEF 60 to 35%, grade 1 diastolic dysfunction who was admitted with chief complaint of acute on chronic combined congestive heart failure   1)Renal  CKD stage 4 . Patient has  CKD since 2015-most likely before that Patient has CKD secondary to diabetes mellitus There is possible contribution from all lung long-term history of hypertension/age associated decline  as patient is more than 86 years old Patient GFR is at his baseline  2)HTN Blood pressure is at goal Patient is on ARB Patient is on beta-blockers Patient is on diuretics  3)Anemia of chronic disease  HGb at goal (9--11) No need of Epogen  4) diabetes mellitus type 2 with CKD This is being closely followed by the primary team  5) acute on chronic heart failure Patient responding well to diuretics Patient is negative by 3 L since March 25  6) electrolytes   sodium Normonatremic   potassium Normokalemic    7)Acid base Co2 at goal     Plan:  Patient has had good response to IV diuretics. Patient edema is now much better. We will change patient IV Lasix to p.o. torsemide in the morning     Aidian Salomon s [facility] 05/25/2019, 10:56 AM

## 2019-05-25 NOTE — Progress Notes (Addendum)
PROGRESS NOTE    Carlos [name]  HMC:[id number] DOB: [birth year] DOA: 05/23/2019 PCP: [name], MD   Brief Narrative:   HPI: Carlos [name] is a 86 y.o. male with medical history significant of chronic kidney disease stage IV, hypertension, hyperlipidemia, diabetes, GERD, gout, peripheral vascular disease, coronary artery disease, diverticulosis, paroxysmal atrial fibrillation on warfarin who was seen by nephrology complaining of bilateral lower extremity edema that is worsening with shortness of breath.  Patient has gained about 10 pounds over the last month.  He also has mainly exertional dyspnea.  Findings are consistent with acute exacerbation of CHF.  He was also noted to have possible lower extremity cellulitis for which he is on Keflex.  Nephrology sent patient for direct admission and recommended IV Lasix drip.  He is to be seen in the morning by Dr. Candiss Norse from nephrology.  Patient denied any shortness of breath at rest.  No cough.  No wheezing.  He is remarkably awake alert and very sharp for a 86 year old  3/26: Patient seen and examined.  Remains on room air.  Net -1.3 L since admission.  Daily weights are unreliable as it indicates a gain and several kilograms since admission.  Patient feels well.  On room air.  3/27: Patient seen and examined.  Remains on room air.  Net -3 L since admission.  Diuresing well.  Feels well.   Assessment & Plan:   Principal Problem:   Acute on chronic combined systolic and diastolic CHF (congestive heart failure) (HCC) Active Problems:   Essential (primary) hypertension   AF (paroxysmal atrial fibrillation) (HCC)   Diabetes mellitus, type 2 (HCC)   Hyperlipidemia   CKD (chronic kidney disease), stage IV (HCC)  Acute decompensated diastolic heart failure Previous ejection fraction is unknown Presentation consistent with ADHF Patient follows with Dr. [name] as outpatient 2D echocardiogram EF 66-29%, grade 1 diastolic  dysfunction Plan: Lasix drip currently, continue for now until nephrology evaluates Monitor replace electrolytes.K>4; Mg>2 Strict ins and outs Daily weights 2D echocardiogram Low-sodium diet Anticipate transition to p.o. torsemide sometime in the next 24 hours, defer to nephrology  Chronic kidney disease stage IV Cardiorenal syndrome Baseline creatinine appears to be approximately 1.9 Patient's creatinine improved after initiation of Lasix drip Suspected cardiorenal syndrome Plan: Continue Lasix drip for now Monitor and replace electrolytes Follow nephrology recommendations   Paroxysmal atrial fibrillation No rate control agents, rate currently controlled Patient on warfarin, resumed Daily INR  Type 2 diabetes Sliding scale insulin She was controlled overnight with  Hyperlipidemia Continue statin  Hypertension Continue Cozaar 25 mg daily Continue metoprolol succinate 25 mg daily Lasix GTT as above  Lower extremity cellulitis Likely superimposed on peripheral edema Recently started on Keflex as outpatient, will continue   DVT prophylaxis: Warfarin Code Status: Full Family Communication: Daughter Victorino December [phone] on 3/27 Disposition Plan: Anticipate return to home.  Current barriers to discharge include fluid overloaded state.  Patient will need diuresis and return to euvolemia prior to discharge.  Consultants:   Nephrology- [facility] [place] [facility]  Procedures:   none  Antimicrobials:   none   Subjective: Patient seen and examined No complaints, feels well   Objective: Vitals:   05/25/19 0427 05/25/19 0534 05/25/19 0939 05/25/19 1141  BP: 126/70  (!) 118/45 129/64  Pulse: 92  91 75  Resp: 20  16 18   Temp: 97.8 F (36.6 C)  97.8 F (36.6 C) (!) 97.5 F (36.4 C)  TempSrc: Oral  Oral Oral  SpO2: 93%  94% 96%  Weight:  100.7 kg    Height:        Intake/Output Summary (Last 24 hours) at 05/25/2019 1228 Last data filed at 05/25/2019  0543 Gross per 24 hour  Intake 75.96 ml  Output 1600 ml  Net -1524.04 ml   Filed Weights   05/23/19 1809 05/24/19 0500 05/25/19 0534  Weight: 99.7 kg 102.2 kg 100.7 kg    Examination:  General exam: Appears calm and comfortable  Respiratory system: Bibasilar crackles, normal respiratory effort, room air Cardiovascular system: S1 & S2 heard, RRR. No JVD, murmurs, rubs, gallops or clicks.  2+ pitting edema bilateral lower extremities Gastrointestinal system: Abdomen is nondistended, soft and nontender. No organomegaly or masses felt. Normal bowel sounds heard. Central nervous system: Alert and oriented. No focal neurological deficits. Extremities: Symmetric 5 x 5 power. Skin: No rashes, lesions or ulcers Psychiatry: Judgement and insight appear normal. Mood & affect appropriate.     Data Reviewed: I have personally reviewed following labs and imaging studies  CBC: Recent Labs  Lab 05/23/19 1847  WBC 4.9  NEUTROABS 3.2  HGB 10.9*  HCT 33.8*  MCV 101.8*  PLT 761   Basic Metabolic Panel: Recent Labs  Lab 05/23/19 1847 05/24/19 0555 05/25/19 0457  NA 143 144 143  K 4.1 3.6 3.5  CL 105 107 104  CO2 27 29 28   GLUCOSE 108* 109* 97  BUN 30* 28* 28*  CREATININE 2.21* 2.01* 1.91*  CALCIUM 8.9 8.7* 9.0  MG  --  2.0 2.2   GFR: Estimated Creatinine Clearance: 28.5 mL/min (A) (by C-G formula based on SCr of 1.91 mg/dL (H)). Liver Function Tests: Recent Labs  Lab 05/23/19 1847  AST 29  ALT 21  ALKPHOS 50  BILITOT 0.7  PROT 6.1*  ALBUMIN 3.6   No results for input(s): LIPASE, AMYLASE in the last 168 hours. No results for input(s): AMMONIA in the last 168 hours. Coagulation Profile: Recent Labs  Lab 05/23/19 2321 05/24/19 0555 05/25/19 0457  INR 3.2* 2.9* 2.2*   Cardiac Enzymes: No results for input(s): CKTOTAL, CKMB, CKMBINDEX, TROPONINI in the last 168 hours. BNP (last 3 results) No results for input(s): PROBNP in the last 8760 hours. HbA1C: Recent Labs     05/23/19 1847  HGBA1C 6.0*   CBG: Recent Labs  Lab 05/24/19 1217 05/24/19 1625 05/24/19 2117 05/25/19 0756 05/25/19 1139  GLUCAP 103* 88 142* 103* 89   Lipid Profile: No results for input(s): CHOL, HDL, LDLCALC, TRIG, CHOLHDL, LDLDIRECT in the last 72 hours. Thyroid Function Tests: Recent Labs    05/23/19 1847  TSH 2.999   Anemia Panel: No results for input(s): VITAMINB12, FOLATE, FERRITIN, TIBC, IRON, RETICCTPCT in the last 72 hours. Sepsis Labs: No results for input(s): PROCALCITON, LATICACIDVEN in the last 168 hours.  Recent Results (from the past 240 hour(s))  SARS CORONAVIRUS 2 (TAT 6-24 HRS) Nasopharyngeal Nasopharyngeal Swab     Status: None   Collection Time: 05/23/19  8:30 PM   Specimen: Nasopharyngeal Swab  Result Value Ref Range Status   SARS Coronavirus 2 NEGATIVE NEGATIVE Final    Comment: (NOTE) SARS-CoV-2 target nucleic acids are NOT DETECTED. The SARS-CoV-2 RNA is generally detectable in upper and lower respiratory specimens during the acute phase of infection. Negative results do not preclude SARS-CoV-2 infection, do not rule out co-infections with other pathogens, and should not be used as the sole basis for treatment or other patient management decisions. Negative results must be combined with clinical observations,  patient history, and epidemiological information. The expected result is Negative. Fact Sheet for Patients: SugarRoll.be Fact Sheet for Healthcare Providers: https://www.woods-mathews.com/ This test is not yet approved or cleared by the [place] FDA and  has been authorized for detection and/or diagnosis of SARS-CoV-2 by FDA under an Emergency Use Authorization (EUA). This EUA will remain  in effect (meaning this test can be used) for the duration of the COVID-19 declaration under Section 56 4(b)(1) of the Act, 21 U.S.C. section 360bbb-3(b)(1), unless the authorization is terminated  or revoked sooner. Performed at [facility], [place] [address] [place], [place] [address]          Radiology Studies: ECHOCARDIOGRAM COMPLETE  Result Date: 05/24/2019    ECHOCARDIOGRAM REPORT   Patient Name:   JAXIN FULFER Date of Exam: 05/24/2019 Medical Rec #:  [id number]   Height:       67.0 in Accession #:    [id number]  Weight:       225.3 lb Date of Birth:  16-Mar-1926   BSA:          2.127 m Patient Age:    58 years    BP:           125/71 mmHg Patient Gender: M           HR:           89 bpm. Exam Location:  [facility] Procedure: 2D Echo, Color Doppler, Cardiac Doppler and Intracardiac            Opacification Agent Indications:     I50.31 CHF-Acute Diastolic  History:         Patient has no prior history of Echocardiogram examinations.                  Previous Myocardial Infarction, PVD; Risk Factors:Hypertension                  and Dyslipidemia.  Sonographer:     Charmayne Sheer RDCS (AE) Referring Phys:  [place] Diagnosing Phys: Isaias Cowman MD  Sonographer Comments: Technically difficult study due to poor echo windows. IMPRESSIONS  1. Left ventricular ejection fraction, by estimation, is 60 to 65%. The left ventricle has normal function. The left ventricle has no regional wall motion abnormalities. Left ventricular diastolic parameters are consistent with Grade I diastolic dysfunction (impaired relaxation).  2. Right ventricular systolic function is normal. The right ventricular size is normal.  3. The mitral valve is normal in structure. Mild mitral valve regurgitation. No evidence of mitral stenosis.  4. The aortic valve is normal in structure. Aortic valve regurgitation is not visualized. No aortic stenosis is present.  5. The inferior vena cava is normal in size with greater than 50% respiratory variability, suggesting right atrial pressure of 3 mmHg. FINDINGS  Left Ventricle: Left ventricular ejection fraction, by estimation, is 60 to 65%. The left ventricle has normal  function. The left ventricle has no regional wall motion abnormalities. Definity contrast agent was given IV to delineate the left ventricular  endocardial borders. The left ventricular internal cavity size was normal in size. There is no left ventricular hypertrophy. Left ventricular diastolic parameters are consistent with Grade I diastolic dysfunction (impaired relaxation). Right Ventricle: The right ventricular size is normal. No increase in right ventricular wall thickness. Right ventricular systolic function is normal. Left Atrium: Left atrial size was normal in size. Right Atrium: Right atrial size was normal in size. Pericardium: There is no evidence  of pericardial effusion. Mitral Valve: The mitral valve is normal in structure. Normal mobility of the mitral valve leaflets. Mild mitral valve regurgitation. No evidence of mitral valve stenosis. MV peak gradient, 6.7 mmHg. The mean mitral valve gradient is 3.0 mmHg. Tricuspid Valve: The tricuspid valve is normal in structure. Tricuspid valve regurgitation is mild . No evidence of tricuspid stenosis. Aortic Valve: The aortic valve is normal in structure. Aortic valve regurgitation is not visualized. No aortic stenosis is present. Aortic valve mean gradient measures 5.0 mmHg. Aortic valve peak gradient measures 9.2 mmHg. Aortic valve area, by VTI measures 2.70 cm. Pulmonic Valve: The pulmonic valve was normal in structure. Pulmonic valve regurgitation is not visualized. No evidence of pulmonic stenosis. Aorta: The aortic root is normal in size and structure. Venous: The inferior vena cava is normal in size with greater than 50% respiratory variability, suggesting right atrial pressure of 3 mmHg. IAS/Shunts: No atrial level shunt detected by color flow Doppler.  LEFT VENTRICLE PLAX 2D LVIDd:         4.41 cm  Diastology LVIDs:         2.99 cm  LV e' lateral:   11.30 cm/s LV PW:         0.93 cm  LV E/e' lateral: 8.2 LV IVS:        0.78 cm  LV e' medial:    7.18  cm/s LVOT diam:     2.10 cm  LV E/e' medial:  12.8 LV SV:         85 LV SV Index:   40 LVOT Area:     3.46 cm  RIGHT VENTRICLE RV Basal diam:  2.63 cm LEFT ATRIUM             Index       RIGHT ATRIUM           Index LA diam:        5.30 cm 2.49 cm/m  RA Area:     12.10 cm LA Vol (A2C):   44.1 ml 20.73 ml/m RA Volume:   25.30 ml  11.89 ml/m LA Vol (A4C):   58.6 ml 27.55 ml/m LA Biplane Vol: 55.2 ml 25.95 ml/m  AORTIC VALVE                    PULMONIC VALVE AV Area (Vmax):    2.64 cm     PV Vmax:       0.75 m/s AV Area (Vmean):   2.87 cm     PV Vmean:      49.800 cm/s AV Area (VTI):     2.70 cm     PV VTI:        0.131 m AV Vmax:           152.00 cm/s  PV Peak grad:  2.2 mmHg AV Vmean:          101.000 cm/s PV Mean grad:  1.0 mmHg AV VTI:            0.313 m AV Peak Grad:      9.2 mmHg AV Mean Grad:      5.0 mmHg LVOT Vmax:         116.00 cm/s LVOT Vmean:        83.600 cm/s LVOT VTI:          0.244 m LVOT/AV VTI ratio: 0.78  AORTA Ao Root diam: 3.10 cm MITRAL VALVE MV Area (PHT): 3.02 cm  SHUNTS MV Peak grad:  6.7 mmHg     Systemic VTI:  0.24 m MV Mean grad:  3.0 mmHg     Systemic Diam: 2.10 cm MV Vmax:       1.29 m/s MV Vmean:      74.2 cm/s MV Decel Time: 252 msec MV E velocity: 92.15 cm/s MV A velocity: 120.00 cm/s MV E/A ratio:  0.77 Isaias Cowman MD Electronically signed by Isaias Cowman MD Signature Date/Time: 05/24/2019/3:40:38 PM    Final         Scheduled Meds: . allopurinol  50 mg Oral Daily  . aspirin EC  81 mg Oral QHS  . cephALEXin  500 mg Oral Q12H  . finasteride  5 mg Oral Daily  . insulin aspart  0-5 Units Subcutaneous QHS  . insulin aspart  0-9 Units Subcutaneous TID WC  . losartan  25 mg Oral Daily  . metoprolol succinate  25 mg Oral Daily  . multivitamin with minerals  1 tablet Oral Daily  . omega-3 acid ethyl esters  1 g Oral Daily  . pantoprazole  40 mg Oral Daily  . simvastatin  40 mg Oral q1800  . sodium chloride flush  3 mL Intravenous Q12H  .  tamsulosin  0.4 mg Oral Daily  . warfarin  3 mg Oral ONCE-1600  . Warfarin - Pharmacist Dosing Inpatient   Does not apply q1800   Continuous Infusions: . sodium chloride    . furosemide (LASIX) infusion 4 mg/hr (05/25/19 0300)     LOS: 2 days    Time spent: 35 minutes    Sidney Ace, MD [facility] Pager [phone]  If 7PM-7AM, please contact night-coverage 05/25/2019, 12:28 PM

## 2019-05-26 LAB — PROTIME-INR
INR: 1.8 — ABNORMAL HIGH (ref 0.8–1.2)
Prothrombin Time: 21.1 seconds — ABNORMAL HIGH (ref 11.4–15.2)

## 2019-05-26 LAB — CBC
HCT: 33.3 % — ABNORMAL LOW (ref 39.0–52.0)
Hemoglobin: 10.8 g/dL — ABNORMAL LOW (ref 13.0–17.0)
MCH: 32.9 pg (ref 26.0–34.0)
MCHC: 32.4 g/dL (ref 30.0–36.0)
MCV: 101.5 fL — ABNORMAL HIGH (ref 80.0–100.0)
Platelets: 182 10*3/uL (ref 150–400)
RBC: 3.28 MIL/uL — ABNORMAL LOW (ref 4.22–5.81)
RDW: 14.4 % (ref 11.5–15.5)
WBC: 4.5 10*3/uL (ref 4.0–10.5)
nRBC: 0 % (ref 0.0–0.2)

## 2019-05-26 LAB — BASIC METABOLIC PANEL
Anion gap: 6 (ref 5–15)
BUN: 33 mg/dL — ABNORMAL HIGH (ref 8–23)
CO2: 32 mmol/L (ref 22–32)
Calcium: 9.1 mg/dL (ref 8.9–10.3)
Chloride: 104 mmol/L (ref 98–111)
Creatinine, Ser: 1.95 mg/dL — ABNORMAL HIGH (ref 0.61–1.24)
GFR calc Af Amer: 34 mL/min — ABNORMAL LOW (ref >60)
GFR calc non Af Amer: 29 mL/min — ABNORMAL LOW (ref >60)
Glucose, Bld: 115 mg/dL — ABNORMAL HIGH (ref 70–99)
Potassium: 3.6 mmol/L (ref 3.5–5.1)
Sodium: 142 mmol/L (ref 135–145)

## 2019-05-26 LAB — GLUCOSE, CAPILLARY
Glucose-Capillary: 109 mg/dL — ABNORMAL HIGH (ref 70–99)
Glucose-Capillary: 87 mg/dL (ref 70–99)
Glucose-Capillary: 86 mg/dL (ref 70–99)
Glucose-Capillary: 106 mg/dL — ABNORMAL HIGH (ref 70–99)

## 2019-05-26 LAB — MAGNESIUM: Magnesium: 2.2 mg/dL (ref 1.7–2.4)

## 2019-05-26 MED ORDER — WARFARIN SODIUM 3 MG PO TABS
3.0000 mg | ORAL_TABLET | Freq: Once | ORAL | Status: AC
  Administered 2019-05-26: 3 mg via ORAL
  Filled 2019-05-26: qty 1

## 2019-05-26 MED ORDER — TORSEMIDE 20 MG PO TABS
40.0000 mg | ORAL_TABLET | Freq: Two times a day (BID) | ORAL | Status: DC
  Administered 2019-05-26: 40 mg via ORAL
  Filled 2019-05-26 (×2): qty 2

## 2019-05-26 MED ORDER — POTASSIUM CHLORIDE CRYS ER 20 MEQ PO TBCR
40.0000 meq | EXTENDED_RELEASE_TABLET | Freq: Once | ORAL | Status: AC
  Administered 2019-05-26: 40 meq via ORAL
  Filled 2019-05-26: qty 2

## 2019-05-26 NOTE — Progress Notes (Signed)
PROGRESS NOTE    Carlos [name]  ONG:[id number] DOB: Nov 04, 1926 DOA: 05/23/2019 PCP: [name], MD   Brief Narrative:   HPI: Carlos [name] is a 86 y.o. male with medical history significant of chronic kidney disease stage IV, hypertension, hyperlipidemia, diabetes, GERD, gout, peripheral vascular disease, coronary artery disease, diverticulosis, paroxysmal atrial fibrillation on warfarin who was seen by nephrology complaining of bilateral lower extremity edema that is worsening with shortness of breath.  Patient has gained about 10 pounds over the last month.  He also has mainly exertional dyspnea.  Findings are consistent with acute exacerbation of CHF.  He was also noted to have possible lower extremity cellulitis for which he is on Keflex.  Nephrology sent patient for direct admission and recommended IV Lasix drip.  He is to be seen in the morning by Dr. Candiss Norse from nephrology.  Patient denied any shortness of breath at rest.  No cough.  No wheezing.  He is remarkably awake alert and very sharp for a 86 year old  3/26: Patient seen and examined.  Remains on room air.  Net -1.3 L since admission.  Daily weights are unreliable as it indicates a gain and several kilograms since admission.  Patient feels well.  On room air.  3/27: Patient seen and examined.  Remains on room air.  Net -3 L since admission.  Diuresing well.  Feels well.  3/28: Patient seen and examined.  Remains on room air.  Continues to diurese well.   Assessment & Plan:   Principal Problem:   Acute on chronic combined systolic and diastolic CHF (congestive heart failure) (HCC) Active Problems:   Essential (primary) hypertension   AF (paroxysmal atrial fibrillation) (HCC)   Diabetes mellitus, type 2 (HCC)   Hyperlipidemia   CKD (chronic kidney disease), stage IV (HCC)  Acute decompensated diastolic heart failure Previous ejection fraction is unknown Presentation consistent with ADHF Patient follows with Dr. [name]  as outpatient 2D echocardiogram EF 44-01%, grade 1 diastolic dysfunction Plan: DC Lasix drip per nephrology Start p.o. torsemide Monitor replace electrolytes.K>4; Mg>2 Strict ins and outs Daily weights Low-sodium diet Anticipate medical readiness for discharge on 05/27/2019  Chronic kidney disease stage IV Cardiorenal syndrome Baseline creatinine appears to be approximately 1.9 Patient's creatinine improved after initiation of Lasix drip Suspected cardiorenal syndrome Plan: DC lasix gtt as above PO torsemide as above Monitor and replace electrolytes Follow nephrology recommendations   Paroxysmal atrial fibrillation No rate control agents, rate currently controlled Patient on warfarin, resumed Daily INR  Type 2 diabetes Sliding scale insulin She was controlled overnight with  Hyperlipidemia Continue statin  Hypertension Continue Cozaar 25 mg daily Continue metoprolol succinate 25 mg daily Lasix GTT as above  Lower extremity cellulitis Likely superimposed on peripheral edema Recently started on Keflex as outpatient, will continue   DVT prophylaxis: Warfarin Code Status: Full Family Communication: Daughter Victorino December [phone] on 3/27 Disposition Plan: Anticipate return to home.  Anticipate medical readiness for discharge on 3 04/09/2019.  Patient will need to tolerate transition to p.o. torsemide and continued improvement of kidney function.  Discussed with nephrology.  Consultants:   Nephrology- [facility] [place] [facility]  Procedures:   none  Antimicrobials:   none   Subjective: Patient seen and examined No complaints, feels well   Objective: Vitals:   05/25/19 1141 05/25/19 2039 05/26/19 0533 05/26/19 1401  BP: 129/64 104/62 (!) 106/55 114/66  Pulse: 75 78 (!) 58 75  Resp: 18 20 18 18   Temp: (!) 97.5 F (36.4 C)  98.3 F (36.8 C) 97.8 F (36.6 C) 97.8 F (36.6 C)  TempSrc: Oral Oral Oral Oral  SpO2: 96% 96% 91% 97%  Weight:   97.3 kg    Height:        Intake/Output Summary (Last 24 hours) at 05/26/2019 1555 Last data filed at 05/26/2019 1345 Gross per 24 hour  Intake 818.73 ml  Output --  Net 818.73 ml   Filed Weights   05/24/19 0500 05/25/19 0534 05/26/19 0533  Weight: 102.2 kg 100.7 kg 97.3 kg    Examination:  General exam: Appears calm and comfortable  Respiratory system: Bibasilar crackles, normal respiratory effort, room air Cardiovascular system: S1 & S2 heard, RRR. No JVD, murmurs, rubs, gallops or clicks.  2+ pitting edema bilateral lower extremities Gastrointestinal system: Abdomen is nondistended, soft and nontender. No organomegaly or masses felt. Normal bowel sounds heard. Central nervous system: Alert and oriented. No focal neurological deficits. Extremities: Symmetric 5 x 5 power. Skin: No rashes, lesions or ulcers Psychiatry: Judgement and insight appear normal. Mood & affect appropriate.     Data Reviewed: I have personally reviewed following labs and imaging studies  CBC: Recent Labs  Lab 05/23/19 1847 05/26/19 0512  WBC 4.9 4.5  NEUTROABS 3.2  --   HGB 10.9* 10.8*  HCT 33.8* 33.3*  MCV 101.8* 101.5*  PLT 178 161   Basic Metabolic Panel: Recent Labs  Lab 05/23/19 1847 05/24/19 0555 05/25/19 0457 05/26/19 0512  NA 143 144 143 142  K 4.1 3.6 3.5 3.6  CL 105 107 104 104  CO2 27 29 28  32  GLUCOSE 108* 109* 97 115*  BUN 30* 28* 28* 33*  CREATININE 2.21* 2.01* 1.91* 1.95*  CALCIUM 8.9 8.7* 9.0 9.1  MG  --  2.0 2.2 2.2   GFR: Estimated Creatinine Clearance: 27.4 mL/min (A) (by C-G formula based on SCr of 1.95 mg/dL (H)). Liver Function Tests: Recent Labs  Lab 05/23/19 1847  AST 29  ALT 21  ALKPHOS 50  BILITOT 0.7  PROT 6.1*  ALBUMIN 3.6   No results for input(s): LIPASE, AMYLASE in the last 168 hours. No results for input(s): AMMONIA in the last 168 hours. Coagulation Profile: Recent Labs  Lab 05/23/19 2321 05/24/19 0555 05/25/19 0457 05/26/19 0512  INR 3.2*  2.9* 2.2* 1.8*   Cardiac Enzymes: No results for input(s): CKTOTAL, CKMB, CKMBINDEX, TROPONINI in the last 168 hours. BNP (last 3 results) No results for input(s): PROBNP in the last 8760 hours. HbA1C: Recent Labs    05/23/19 1847  HGBA1C 6.0*   CBG: Recent Labs  Lab 05/25/19 1139 05/25/19 1714 05/25/19 2203 05/26/19 0747 05/26/19 1149  GLUCAP 89 101* 120* 109* 87   Lipid Profile: No results for input(s): CHOL, HDL, LDLCALC, TRIG, CHOLHDL, LDLDIRECT in the last 72 hours. Thyroid Function Tests: Recent Labs    05/23/19 1847  TSH 2.999   Anemia Panel: No results for input(s): VITAMINB12, FOLATE, FERRITIN, TIBC, IRON, RETICCTPCT in the last 72 hours. Sepsis Labs: No results for input(s): PROCALCITON, LATICACIDVEN in the last 168 hours.  Recent Results (from the past 240 hour(s))  SARS CORONAVIRUS 2 (TAT 6-24 HRS) Nasopharyngeal Nasopharyngeal Swab     Status: None   Collection Time: 05/23/19  8:30 PM   Specimen: Nasopharyngeal Swab  Result Value Ref Range Status   SARS Coronavirus 2 NEGATIVE NEGATIVE Final    Comment: (NOTE) SARS-CoV-2 target nucleic acids are NOT DETECTED. The SARS-CoV-2 RNA is generally detectable in upper and lower respiratory specimens  during the acute phase of infection. Negative results do not preclude SARS-CoV-2 infection, do not rule out co-infections with other pathogens, and should not be used as the sole basis for treatment or other patient management decisions. Negative results must be combined with clinical observations, patient history, and epidemiological information. The expected result is Negative. Fact Sheet for Patients: SugarRoll.be Fact Sheet for Healthcare Providers: https://www.woods-mathews.com/ This test is not yet approved or cleared by the [place] FDA and  has been authorized for detection and/or diagnosis of SARS-CoV-2 by FDA under an Emergency Use Authorization (EUA). This  EUA will remain  in effect (meaning this test can be used) for the duration of the COVID-19 declaration under Section 56 4(b)(1) of the Act, 21 U.S.C. section 360bbb-3(b)(1), unless the authorization is terminated or revoked sooner. Performed at [facility], [place] [address], [place], [place] [address]          Radiology Studies: No results found.      Scheduled Meds: . allopurinol  50 mg Oral Daily  . aspirin EC  81 mg Oral QHS  . cephALEXin  500 mg Oral Q12H  . finasteride  5 mg Oral Daily  . insulin aspart  0-5 Units Subcutaneous QHS  . insulin aspart  0-9 Units Subcutaneous TID WC  . losartan  25 mg Oral Daily  . metoprolol succinate  25 mg Oral Daily  . multivitamin with minerals  1 tablet Oral Daily  . omega-3 acid ethyl esters  1 g Oral Daily  . pantoprazole  40 mg Oral Daily  . simvastatin  40 mg Oral q1800  . sodium chloride flush  3 mL Intravenous Q12H  . tamsulosin  0.4 mg Oral Daily  . torsemide  40 mg Oral BID  . Warfarin - Pharmacist Dosing Inpatient   Does not apply q1800   Continuous Infusions: . sodium chloride    . furosemide (LASIX) infusion Stopped (05/26/19 1154)     LOS: 3 days    Time spent: 35 minutes    Sidney Ace, MD [facility] Pager [phone]  If 7PM-7AM, please contact night-coverage 05/26/2019, 3:55 PM

## 2019-05-26 NOTE — Progress Notes (Signed)
Carlos [name]  MRN: [id number]  DOB/AGE: 1926-03-17 86 y.o.  Primary Care Physician:Feldpausch, Chrissie Noa, MD  Admit date: 05/23/2019  Chief Complaint: No chief complaint on file.   S-Pt presented on  05/23/2019 with No chief complaint on file. . Patient offers no specific complaints. Patient's main complaint in today's visit was "I am feeling grand"  Medications . allopurinol  50 mg Oral Daily  . aspirin EC  81 mg Oral QHS  . cephALEXin  500 mg Oral Q12H  . finasteride  5 mg Oral Daily  . insulin aspart  0-5 Units Subcutaneous QHS  . insulin aspart  0-9 Units Subcutaneous TID WC  . losartan  25 mg Oral Daily  . metoprolol succinate  25 mg Oral Daily  . multivitamin with minerals  1 tablet Oral Daily  . omega-3 acid ethyl esters  1 g Oral Daily  . pantoprazole  40 mg Oral Daily  . potassium chloride  40 mEq Oral Once  . simvastatin  40 mg Oral q1800  . sodium chloride flush  3 mL Intravenous Q12H  . tamsulosin  0.4 mg Oral Daily  . warfarin  3 mg Oral ONCE-1600  . Warfarin - Pharmacist Dosing Inpatient   Does not apply q1800         RJJ:OACZY from the symptoms mentioned above,there are no other symptoms referable to all systems reviewed.  Physical Exam: Vital signs in last 24 hours: Temp:  [97.5 F (36.4 C)-98.3 F (36.8 C)] 97.8 F (36.6 C) (03/28 0533) Pulse Rate:  [58-78] 58 (03/28 0533) Resp:  [18-20] 18 (03/28 0533) BP: (104-129)/(55-64) 106/55 (03/28 0533) SpO2:  [91 %-96 %] 91 % (03/28 0533) Weight:  [97.3 kg] 97.3 kg (03/28 0533) Weight change: -3.4 kg Last BM Date: 05/24/19  Intake/Output from previous day: 03/27 0701 - 03/28 0700 In: 338.7 [P.O.:240; I.V.:98.7] Out: -  No intake/output data recorded.   Physical Exam: General- pt is awake,alert, oriented to time place and person Resp- No acute REsp distress, CTA B/L NO Rhonchi CVS- S1S2 regular in rate and rhythm GIT- BS+, soft, NT, ND EXT- 1+ LE Edema, Cyanosis   Lab Results: CBC Recent  Labs    05/23/19 1847 05/26/19 0512  WBC 4.9 4.5  HGB 10.9* 10.8*  HCT 33.8* 33.3*  PLT 178 182    BMET Recent Labs    05/25/19 0457 05/26/19 0512  NA 143 142  K 3.5 3.6  CL 104 104  CO2 28 32  GLUCOSE 97 115*  BUN 28* 33*  CREATININE 1.91* 1.95*  CALCIUM 9.0 9.1    Creat trend 2021 1.9--2.2 2016  1.9 2015  1.8--2.3   MICRO Recent Results (from the past 240 hour(s))  SARS CORONAVIRUS 2 (TAT 6-24 HRS) Nasopharyngeal Nasopharyngeal Swab     Status: None   Collection Time: 05/23/19  8:30 PM   Specimen: Nasopharyngeal Swab  Result Value Ref Range Status   SARS Coronavirus 2 NEGATIVE NEGATIVE Final    Comment: (NOTE) SARS-CoV-2 target nucleic acids are NOT DETECTED. The SARS-CoV-2 RNA is generally detectable in upper and lower respiratory specimens during the acute phase of infection. Negative results do not preclude SARS-CoV-2 infection, do not rule out co-infections with other pathogens, and should not be used as the sole basis for treatment or other patient management decisions. Negative results must be combined with clinical observations, patient history, and epidemiological information. The expected result is Negative. Fact Sheet for Patients: SugarRoll.be Fact Sheet for Healthcare Providers: https://www.woods-mathews.com/ This test is  not yet approved or cleared by the [place] and  has been authorized for detection and/or diagnosis of SARS-CoV-2 by FDA under an Emergency Use Authorization (EUA). This EUA will remain  in effect (meaning this test can be used) for the duration of the COVID-19 declaration under Section 56 4(b)(1) of the Act, 21 U.S.C. section 360bbb-3(b)(1), unless the authorization is terminated or revoked sooner. Performed at [facility], [place] [address] [place], [place] [address]       Lab Results  Component Value Date   CALCIUM 9.1 05/26/2019                Impression:  86 y.o. male with medical problems and past medical history of Atrial fibrillation,Type 2 diabetes,Gout,Hypertension,GERD,Coronary disease,Peripheral vascular disease Congestive heart failure.  2D echo May 24, 2019: LVEF 60 to 70%, grade 1 diastolic dysfunction who was admitted with chief complaint of acute on chronic combined congestive heart failure   1)Renal  CKD stage 4 . Patient has  CKD since 2015-most likely before that Patient has CKD secondary to diabetes mellitus There is possible contribution from all lung long-term history of hypertension/age associated decline  as patient is more than 86 years old Patient GFR is at his baseline  2)HTN Blood pressure is at goal Patient is on ARB Patient is on beta-blockers Patient is on diuretics  3)Anemia of chronic disease  HGb at goal (9--11) No need of Epogen  4) diabetes mellitus type 2 with CKD This is being closely followed by the primary team  5) acute on chronic heart failure Patient responding well to diuretics Patient is negative by 3 L since March 25. Patient is clinically much better  6) electrolytes   sodium Normonatremic   potassium Normokalemic    7)Acid base Co2 at goal     Plan:  Patient has had good response to IV diuretics. Patient edema is now much better. We will change patient IV Lasix to p.o. torsemide . Patient came in with outpatient prescription of torsemide of 20 mg p.o. daily.   Will increase patient's torsemide to 40 mg p.o. daily    [name] s [facility] 05/26/2019, 9:57 AM

## 2019-05-26 NOTE — Progress Notes (Signed)
ANTICOAGULATION CONSULT NOTE - Initial Consult  Pharmacy Consult for warfarin Indication: atrial fibrillation  Allergies  Allergen Reactions  . Morphine And Related Other (See Comments)    "nightmares"    Patient Measurements: Height: 5\' 7"  (170.2 cm) Weight: 214 lb 8.1 oz (97.3 kg) IBW/kg (Calculated) : 66.1  Vital Signs: Temp: 97.8 F (36.6 C) (03/28 0533) Temp Source: Oral (03/28 0533) BP: 106/55 (03/28 0533) Pulse Rate: 58 (03/28 0533)  Labs: Recent Labs    05/23/19 1847 05/23/19 2321 05/24/19 0555 05/25/19 0457 05/26/19 0512  HGB 10.9*  --   --   --  10.8*  HCT 33.8*  --   --   --  33.3*  PLT 178  --   --   --  182  LABPROT  --    < > 30.1* 24.4* 21.1*  INR  --    < > 2.9* 2.2* 1.8*  CREATININE 2.21*  --  2.01* 1.91* 1.95*   < > = values in this interval not displayed.    Estimated Creatinine Clearance: 27.4 mL/min (A) (by C-G formula based on SCr of 1.95 mg/dL (H)).   Medical History: Past Medical History:  Diagnosis Date  . Arthritis   . Atrial fibrillation ([place])   . CHF (congestive heart failure) ([place])   . Chronic kidney disease    kidney stones  . Coronary artery disease   . Diabetes mellitus without complication ([place])   . Diverticulosis   . GERD (gastroesophageal reflux disease)   . Gout   . Hyperlipidemia   . Hypertension   . Myocardial infarction ([place]) 2001  . Peripheral vascular disease ([place])   . Pneumothorax, left 1950    Medications:  Scheduled:  . allopurinol  50 mg Oral Daily  . aspirin EC  81 mg Oral QHS  . cephALEXin  500 mg Oral Q12H  . finasteride  5 mg Oral Daily  . insulin aspart  0-5 Units Subcutaneous QHS  . insulin aspart  0-9 Units Subcutaneous TID WC  . losartan  25 mg Oral Daily  . metoprolol succinate  25 mg Oral Daily  . multivitamin with minerals  1 tablet Oral Daily  . omega-3 acid ethyl esters  1 g Oral Daily  . pantoprazole  40 mg Oral Daily  . simvastatin  40 mg Oral q1800  . sodium chloride flush  3 mL  Intravenous Q12H  . tamsulosin  0.4 mg Oral Daily  . Warfarin - Pharmacist Dosing Inpatient   Does not apply q1800    Assessment: Patient here for increased lower extremity edema w/ h/o afib anticoagulated w/ wafarin 3 mg daily and 1 mg as needed? Patient's INR is currently 3.2.  Date INR Warfarin Dose  3/25 3.2 Held  3/26 2.9 Held  3/27 2.2 3 mg  3/28 1.8 3 mg     Goal of Therapy:  INR 2-3 Monitor platelets by anticoagulation protocol: Yes   Plan:  INR is subtherapeutic. INR dropped as predicted. Will continue warfarin 3 mg (home dose) tonight. Daily INR ordered.  Will monitor CBC every 3 days. and adjust per INR trends.  Eleonore Chiquito, PharmD, BCPS Clinical Pharmacist 05/26/2019,9:14 AM

## 2019-05-27 LAB — PROTIME-INR
INR: 1.8 — ABNORMAL HIGH (ref 0.8–1.2)
Prothrombin Time: 20.6 seconds — ABNORMAL HIGH (ref 11.4–15.2)

## 2019-05-27 LAB — BASIC METABOLIC PANEL
Anion gap: 12 (ref 5–15)
BUN: 40 mg/dL — ABNORMAL HIGH (ref 8–23)
CO2: 29 mmol/L (ref 22–32)
Calcium: 9.2 mg/dL (ref 8.9–10.3)
Chloride: 102 mmol/L (ref 98–111)
Creatinine, Ser: 2.17 mg/dL — ABNORMAL HIGH (ref 0.61–1.24)
GFR calc Af Amer: 30 mL/min — ABNORMAL LOW (ref >60)
GFR calc non Af Amer: 26 mL/min — ABNORMAL LOW (ref >60)
Glucose, Bld: 106 mg/dL — ABNORMAL HIGH (ref 70–99)
Potassium: 3.7 mmol/L (ref 3.5–5.1)
Sodium: 143 mmol/L (ref 135–145)

## 2019-05-27 LAB — GLUCOSE, CAPILLARY
Glucose-Capillary: 104 mg/dL — ABNORMAL HIGH (ref 70–99)
Glucose-Capillary: 91 mg/dL (ref 70–99)

## 2019-05-27 LAB — MAGNESIUM: Magnesium: 2.2 mg/dL (ref 1.7–2.4)

## 2019-05-27 MED ORDER — TORSEMIDE 20 MG PO TABS: 40.0000 mg | ORAL_TABLET | Freq: Every day | ORAL | 0 refills | Status: DC

## 2019-05-27 MED ORDER — WARFARIN SODIUM 4 MG PO TABS
4.0000 mg | ORAL_TABLET | Freq: Once | ORAL | Status: DC
  Filled 2019-05-27: qty 1

## 2019-05-27 MED ORDER — TORSEMIDE 20 MG PO TABS: 40.0000 mg | ORAL_TABLET | Freq: Every day | ORAL | Status: DC

## 2019-05-27 NOTE — Care Management Important Message (Signed)
Important Message  Patient Details  Name: Carlos [name] MRN: [id number] Date of Birth: [birth year]   Medicare Important Message Given:  Yes     Dannette Barbara 05/27/2019, 1:34 PM

## 2019-05-27 NOTE — TOC Transition Note (Signed)
Transition of Care [facility]) - CM/SW Discharge Note   Patient Details  Name: Sharad Vaneaton MRN: [id number] Date of Birth: [birth year]  [facility] of [facility]) CM/SW Contact:  Beverly Sessions, RN Phone Number: 05/27/2019, 2:11 PM   Clinical Narrative:     Patient to discharge home today Patient has improved with PT, and they are recommending outpatient PT  Patient aware of updated recommendations.   Patient request that he drive himself home today.  RNCM confirmed with MD that there are no limitations and patient can drive himself  Final next level of care: Home/Self Care Barriers to Discharge: No Barriers Identified   Patient Goals and CMS Choice        Discharge Placement                       Discharge Plan and Services                                     Social Determinants of Health (SDOH) Interventions     Readmission Risk Interventions No flowsheet data found.

## 2019-05-27 NOTE — Progress Notes (Signed)
Physical Therapy Treatment Patient Details Name: Carlos [name] MRN: [id number] DOB: 1926/10/25 Today's Date: 05/27/2019    History of Present Illness Carlos [name] is a 78yoM who comes [facility] on 3/[facility] c LEE and SOB. Pt admitted for CHF exacerbation. PMH: chronic kidney disease stage IV, hypertension, hyperlipidemia, diabetes, GERD, gout, peripheral vascular disease, coronary artery disease, diverticulosis, paroxysmal atrial fibrillation on warfarin.    PT Comments    Pt in bed upon entry, agreeable to participate. Pt performing all transfers and AMB at supervision level or better. Noted good safety awareness in session, safe use of 4WW. Pt able to advance AMB distance to >347ft with a significant increase in gait speed too which is not appropriate for limited community distances. Pt moving closer to baseline, endorses confidence in return to home today.       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)     Equipment Recommendations  None recommended by PT    Recommendations for Other Services       Precautions / Restrictions Precautions Precautions: Fall Restrictions Weight Bearing Restrictions: No    Mobility  Bed Mobility Overal bed mobility: Independent                Transfers Overall transfer level: Modified independent Equipment used: 4-wheeled walker                Ambulation/Gait Ambulation/Gait assistance: Supervision Gait Distance (Feet): 360 Feet Assistive device: 4-wheeled walker Gait Pattern/deviations: Step-through pattern Gait velocity: 0.56m/s this date; 0.66m/s at evaluation.   General Gait Details: feeling closer to baseline; gets fatigued in arms from 4WW use.   Stairs             Wheelchair Mobility    Modified Rankin (Stroke Patients Only)       Balance Overall balance assessment: History of Falls;Modified Independent;Mild deficits observed, not  formally tested                               Standardized Balance Assessment Standardized Balance Assessment : Berg Balance Test          Cognition Arousal/Alertness: Awake/alert Behavior During Therapy: WFL for tasks assessed/performed Overall Cognitive Status: Within Functional Limits for tasks assessed                                        Exercises      General Comments        Pertinent Vitals/Pain Pain Assessment: No/denies pain    Home Living                      Prior Function            PT Goals (current goals can now be found in the care plan section) Acute Rehab PT Goals Patient Stated Goal: regain baseline mobility, independence PT Goal Formulation: With patient Time For Goal Achievement: 06/07/19 Potential to Achieve Goals: Good Progress towards PT goals: Progressing toward goals    Frequency    Min 2X/week      PT Plan Discharge plan needs to be updated    Co-evaluation              AM-PAC PT "6 Clicks" Mobility   Outcome Measure  Help needed turning from your  back to your side while in a flat bed without using bedrails?: None Help needed moving from lying on your back to sitting on the side of a flat bed without using bedrails?: None Help needed moving to and from a bed to a chair (including a wheelchair)?: A Little Help needed standing up from a chair using your arms (e.g., wheelchair or bedside chair)?: A Little Help needed to walk in hospital room?: A Little Help needed climbing 3-5 steps with a railing? : A Little 6 Click Score: 20    End of Session Equipment Utilized During Treatment: Gait belt Activity Tolerance: Patient tolerated treatment well;Patient limited by fatigue Patient left: in bed;with call bell/phone within reach(lunch tray set up) Nurse Communication: Mobility status PT Visit Diagnosis: Unsteadiness on feet (R26.81);Other abnormalities of gait and mobility  (R26.89);Repeated falls (R29.6);Difficulty in walking, not elsewhere classified (R26.2)     Time: [id number] PT Time Calculation (min) (ACUTE ONLY): 17 min  Charges:  $Therapeutic Exercise: 8-22 mins                     12:30 PM, 05/27/19 Etta Grandchild, PT, DPT Physical Therapist - [facility]  [phone] ([place])    [place] C 05/27/2019, 12:28 PM

## 2019-05-27 NOTE — Discharge Summary (Signed)
Physician Discharge Summary  Carlos [name] KPT:[id number] DOB: Sep 14, 1926 DOA: 05/23/2019  PCP: [name], MD  Admit date: 05/23/2019 Discharge date: 05/27/2019  Admitted From:Home Disposition: Home  Recommendations for Outpatient Follow-up:  1. Follow up with PCP in 1-2 weeks 2.  follow-up with your nephrologist Dr. Holley Raring as directed  Home Health: No Equipment/Devices none  Discharge Condition: Stable CODE STATUS: Full Diet recommendation: Heart Healthy  Brief/Interim Summary: TZG:YFVCBS Ryanis a 86 y.o.malewith medical history significant ofchronic kidney disease stage IV, hypertension, hyperlipidemia, diabetes, GERD, gout, peripheral vascular disease, coronary artery disease, diverticulosis, paroxysmal atrial fibrillation on warfarin who was seen by nephrology complaining of bilateral lower extremity edema that is worsening with shortness of breath. Patient has gained about 10 pounds over the last month. He also has mainly exertional dyspnea. Findings are consistent with acute exacerbation of CHF. He was also noted to have possible lower extremity cellulitis for which he is on Keflex. Nephrology sent patient for direct admission and recommended IV Lasix drip. He is to be seen in the morning by Dr. Candiss Norse from nephrology. Patient denied any shortness of breath at rest. No cough. No wheezing. He is remarkably awake alert and very sharp for a 86 year old  3/26: Patient seen and examined.  Remains on room air.  Net -1.3 L since admission.  Daily weights are unreliable as it indicates a gain and several kilograms since admission.  Patient feels well.  On room air.  3/27: Patient seen and examined.  Remains on room air.  Net -3 L since admission.  Diuresing well.  Feels well.  3/28: Patient seen and examined.  Remains on room air.  Continues to diurese well.  3/29: Patient seen and examined.  On room air.  Fluid overload resolved.  Creatinine slightly elevated today.   Discussed with nephrology.  Dr. Holley Raring is patient's primary outpatient nephrologist.  Discharge improved.  Patient will follow up with nephrologist office in 2 weeks.  Outpatient physical therapy ordered.  Patient discharged home in stable condition.  Discharge Diagnoses:  Principal Problem:   Acute on chronic combined systolic and diastolic CHF (congestive heart failure) (HCC) Active Problems:   Essential (primary) hypertension   AF (paroxysmal atrial fibrillation) (HCC)   Diabetes mellitus, type 2 (HCC)   Hyperlipidemia   CKD (chronic kidney disease), stage IV (HCC)  Acute decompensated diastolic heart failure Previous ejection fraction is unknown Presentation consistent with ADHF Patient follows with Dr. [name] as outpatient 2D echocardiogram EF 49-67%, grade 1 diastolic dysfunction On Lasix drip in house, followed by nephrology On fluid overloaded resolved p.o. torsemide was initiated Patient had a slight increase in creatinine after twice daily torsemide Case discussed in nephrology, decrease dose to 40 mg torsemide once daily Patient will follow up with nephrology and cardiology as directed Stable for discharge at this time Of note patient has been eating a lot of [employer] as an outpatient.  He was educated on the high salt content of this food and advised to alter his dietary intake   Chronic kidney disease stage IV Cardiorenal syndrome Baseline creatinine appears to be approximately 1.9 Patient's creatinine improved after initiation of Lasix drip Suspected cardiorenal syndrome Creatinine stable at time of discharge Slight increase attributed to twice daily torsemide Dose decreased to once daily   Paroxysmal atrial fibrillation No rate control agents, rate currently controlled Patient on warfarin, resumed   Type 2 diabetes Resume home regimen  Hyperlipidemia Continue statin  Hypertension Continue Cozaar 25 mg daily Continue metoprolol succinate 25 mg  daily Resume home torsemide  Lower extremity cellulitis Likely superimposed on peripheral edema Recently started on Keflex as outpatient, will continue  Discharge Instructions  Discharge Instructions    Ambulatory referral to Physical Therapy   Complete by: As directed    Diet - low sodium heart healthy   Complete by: As directed    Increase activity slowly   Complete by: As directed      Allergies as of 05/27/2019      Reactions   Morphine And Related Other (See Comments)   "nightmares"      Medication List    TAKE these medications   allopurinol 300 MG tablet Commonly known as: ZYLOPRIM Take 150 mg by mouth daily.   aspirin EC 81 MG tablet Take 81 mg by mouth at bedtime.   finasteride 5 MG tablet Commonly known as: PROSCAR Take 1 tablet (5 mg total) by mouth daily.   Fish Oil 1000 MG Caps Take 1 capsule by mouth daily.   losartan 25 MG tablet Commonly known as: COZAAR Take 25 mg by mouth daily.   metoprolol succinate 25 MG 24 hr tablet Commonly known as: TOPROL-XL Take 25 mg by mouth daily.   Multi-Vitamins Tabs Take 1 tablet by mouth daily.   pantoprazole 40 MG tablet Commonly known as: PROTONIX Take 40 mg by mouth daily.   simvastatin 40 MG tablet Commonly known as: ZOCOR Take 40 mg by mouth daily at 6 PM.   tamsulosin 0.4 MG Caps capsule Commonly known as: FLOMAX Take 1 capsule (0.4 mg total) by mouth daily.   torsemide 20 MG tablet Commonly known as: DEMADEX Take 2 tablets (40 mg total) by mouth daily. Start taking on: May 28, 2019 What changed: how much to take   warfarin 1 MG tablet Commonly known as: COUMADIN Take 1 mg by mouth daily.   warfarin 3 MG tablet Commonly known as: COUMADIN Take 3 mg by mouth daily at 6 PM.      Follow-up Information    [facility] Follow up on 05/30/2019.   Specialty: Cardiology Why: at 2:30pm. Enter through the [place] entrance Contact  information: [place]         Allergies  Allergen Reactions  . Morphine And Related Other (See Comments)    "nightmares"    Consultations:  Nephrology-[facility]   Procedures/Studies: ECHOCARDIOGRAM COMPLETE  Result Date: 05/24/2019    ECHOCARDIOGRAM REPORT   Patient Name:   Carlos [name] Date of Exam: 05/24/2019 Medical Rec #:  [id number]   Height:       67.0 in Accession #:    [id number]  Weight:       225.3 lb Date of Birth:  16-Jun-1926   BSA:          2.127 m Patient Age:    21 years    BP:           125/71 mmHg Patient Gender: M           HR:           89 bpm. Exam Location:  [facility] Procedure: 2D Echo, Color Doppler, Cardiac Doppler and Intracardiac            Opacification Agent Indications:     I50.31 CHF-Acute Diastolic  History:         Patient has no prior history of Echocardiogram examinations.  Previous Myocardial Infarction, PVD; Risk Factors:Hypertension                  and Dyslipidemia.  Sonographer:     Charmayne Sheer RDCS (AE) Referring Phys:  Victor Diagnosing Phys: Isaias Cowman MD  Sonographer Comments: Technically difficult study due to poor echo windows. IMPRESSIONS  1. Left ventricular ejection fraction, by estimation, is 60 to 65%. The left ventricle has normal function. The left ventricle has no regional wall motion abnormalities. Left ventricular diastolic parameters are consistent with Grade I diastolic dysfunction (impaired relaxation).  2. Right ventricular systolic function is normal. The right ventricular size is normal.  3. The mitral valve is normal in structure. Mild mitral valve regurgitation. No evidence of mitral stenosis.  4. The aortic valve is normal in structure. Aortic valve regurgitation is not visualized. No aortic stenosis is present.  5. The inferior vena cava is normal in size with greater than 50% respiratory variability, suggesting right  atrial pressure of 3 mmHg. FINDINGS  Left Ventricle: Left ventricular ejection fraction, by estimation, is 60 to 65%. The left ventricle has normal function. The left ventricle has no regional wall motion abnormalities. Definity contrast agent was given IV to delineate the left ventricular  endocardial borders. The left ventricular internal cavity size was normal in size. There is no left ventricular hypertrophy. Left ventricular diastolic parameters are consistent with Grade I diastolic dysfunction (impaired relaxation). Right Ventricle: The right ventricular size is normal. No increase in right ventricular wall thickness. Right ventricular systolic function is normal. Left Atrium: Left atrial size was normal in size. Right Atrium: Right atrial size was normal in size. Pericardium: There is no evidence of pericardial effusion. Mitral Valve: The mitral valve is normal in structure. Normal mobility of the mitral valve leaflets. Mild mitral valve regurgitation. No evidence of mitral valve stenosis. MV peak gradient, 6.7 mmHg. The mean mitral valve gradient is 3.0 mmHg. Tricuspid Valve: The tricuspid valve is normal in structure. Tricuspid valve regurgitation is mild . No evidence of tricuspid stenosis. Aortic Valve: The aortic valve is normal in structure. Aortic valve regurgitation is not visualized. No aortic stenosis is present. Aortic valve mean gradient measures 5.0 mmHg. Aortic valve peak gradient measures 9.2 mmHg. Aortic valve area, by VTI measures 2.70 cm. Pulmonic Valve: The pulmonic valve was normal in structure. Pulmonic valve regurgitation is not visualized. No evidence of pulmonic stenosis. Aorta: The aortic root is normal in size and structure. Venous: The inferior vena cava is normal in size with greater than 50% respiratory variability, suggesting right atrial pressure of 3 mmHg. IAS/Shunts: No atrial level shunt detected by color flow Doppler.  LEFT VENTRICLE PLAX 2D LVIDd:         4.41 cm   Diastology LVIDs:         2.99 cm  LV e' lateral:   11.30 cm/s LV PW:         0.93 cm  LV E/e' lateral: 8.2 LV IVS:        0.78 cm  LV e' medial:    7.18 cm/s LVOT diam:     2.10 cm  LV E/e' medial:  12.8 LV SV:         85 LV SV Index:   40 LVOT Area:     3.46 cm  RIGHT VENTRICLE RV Basal diam:  2.63 cm LEFT ATRIUM             Index  RIGHT ATRIUM           Index LA diam:        5.30 cm 2.49 cm/m  RA Area:     12.10 cm LA Vol (A2C):   44.1 ml 20.73 ml/m RA Volume:   25.30 ml  11.89 ml/m LA Vol (A4C):   58.6 ml 27.55 ml/m LA Biplane Vol: 55.2 ml 25.95 ml/m  AORTIC VALVE                    PULMONIC VALVE AV Area (Vmax):    2.64 cm     PV Vmax:       0.75 m/s AV Area (Vmean):   2.87 cm     PV Vmean:      49.800 cm/s AV Area (VTI):     2.70 cm     PV VTI:        0.131 m AV Vmax:           152.00 cm/s  PV Peak grad:  2.2 mmHg AV Vmean:          101.000 cm/s PV Mean grad:  1.0 mmHg AV VTI:            0.313 m AV Peak Grad:      9.2 mmHg AV Mean Grad:      5.0 mmHg LVOT Vmax:         116.00 cm/s LVOT Vmean:        83.600 cm/s LVOT VTI:          0.244 m LVOT/AV VTI ratio: 0.78  AORTA Ao Root diam: 3.10 cm MITRAL VALVE MV Area (PHT): 3.02 cm     SHUNTS MV Peak grad:  6.7 mmHg     Systemic VTI:  0.24 m MV Mean grad:  3.0 mmHg     Systemic Diam: 2.10 cm MV Vmax:       1.29 m/s MV Vmean:      74.2 cm/s MV Decel Time: 252 msec MV E velocity: 92.15 cm/s MV A velocity: 120.00 cm/s MV E/A ratio:  0.77 Isaias Cowman MD Electronically signed by Isaias Cowman MD Signature Date/Time: 05/24/2019/3:40:38 PM    Final     (Echo, Carotid, EGD, Colonoscopy, ERCP)    Subjective: Seen and examined on the day of discharge No complaints, feels well Medically stable for discharge home  Discharge Exam: Vitals:   05/27/19 1018 05/27/19 1207  BP: (!) 103/52 106/67  Pulse: (!) 59 74  Resp:    Temp:    SpO2:     Vitals:   05/26/19 2055 05/27/19 0455 05/27/19 1018 05/27/19 1207  BP: 124/64 (!) 113/52 (!)  103/52 106/67  Pulse: 72 79 (!) 59 74  Resp: 20 20    Temp: 98 F (36.7 C) 97.9 F (36.6 C)    TempSrc: Oral Oral    SpO2: 95% 94%    Weight:      Height:        General: Pt is alert, awake, not in acute distress Cardiovascular: RRR, S1/S2 +, no rubs, no gallops Respiratory: CTA bilaterally, no wheezing, no rhonchi Abdominal: Soft, NT, ND, bowel sounds + Extremities: no edema, no cyanosis    The results of significant diagnostics from this hospitalization (including imaging, microbiology, ancillary and laboratory) are listed below for reference.     Microbiology: Recent Results (from the past 240 hour(s))  SARS CORONAVIRUS 2 (TAT 6-24 HRS) Nasopharyngeal Nasopharyngeal Swab     Status: None  Collection Time: 05/23/19  8:30 PM   Specimen: Nasopharyngeal Swab  Result Value Ref Range Status   SARS Coronavirus 2 NEGATIVE NEGATIVE Final    Comment: (NOTE) SARS-CoV-2 target nucleic acids are NOT DETECTED. The SARS-CoV-2 RNA is generally detectable in upper and lower respiratory specimens during the acute phase of infection. Negative results do not preclude SARS-CoV-2 infection, do not rule out co-infections with other pathogens, and should not be used as the sole basis for treatment or other patient management decisions. Negative results must be combined with clinical observations, patient history, and epidemiological information. The expected result is Negative. Fact Sheet for Patients: SugarRoll.be Fact Sheet for Healthcare Providers: https://www.woods-mathews.com/ This test is not yet approved or cleared by the [place] FDA and  has been authorized for detection and/or diagnosis of SARS-CoV-2 by FDA under an Emergency Use Authorization (EUA). This EUA will remain  in effect (meaning this test can be used) for the duration of the COVID-19 declaration under Section 56 4(b)(1) of the Act, 21 U.S.C. section 360bbb-3(b)(1), unless  the authorization is terminated or revoked sooner. Performed at [facility], [place] [address], [place], [place] [address]      Labs: BNP (last 3 results) No results for input(s): BNP in the last 8760 hours. Basic Metabolic Panel: Recent Labs  Lab 05/23/19 1847 05/24/19 0555 05/25/19 0457 05/26/19 0512 05/27/19 0505  NA 143 144 143 142 143  K 4.1 3.6 3.5 3.6 3.7  CL 105 107 104 104 102  CO2 27 29 28  32 29  GLUCOSE 108* 109* 97 115* 106*  BUN 30* 28* 28* 33* 40*  CREATININE 2.21* 2.01* 1.91* 1.95* 2.17*  CALCIUM 8.9 8.7* 9.0 9.1 9.2  MG  --  2.0 2.2 2.2 2.2   Liver Function Tests: Recent Labs  Lab 05/23/19 1847  AST 29  ALT 21  ALKPHOS 50  BILITOT 0.7  PROT 6.1*  ALBUMIN 3.6   No results for input(s): LIPASE, AMYLASE in the last 168 hours. No results for input(s): AMMONIA in the last 168 hours. CBC: Recent Labs  Lab 05/23/19 1847 05/26/19 0512  WBC 4.9 4.5  NEUTROABS 3.2  --   HGB 10.9* 10.8*  HCT 33.8* 33.3*  MCV 101.8* 101.5*  PLT 178 182   Cardiac Enzymes: No results for input(s): CKTOTAL, CKMB, CKMBINDEX, TROPONINI in the last 168 hours. BNP: Invalid input(s): POCBNP CBG: Recent Labs  Lab 05/26/19 1149 05/26/19 1640 05/26/19 2134 05/27/19 0807 05/27/19 1205  GLUCAP 87 86 106* 104* 91   D-Dimer No results for input(s): DDIMER in the last 72 hours. Hgb A1c No results for input(s): HGBA1C in the last 72 hours. Lipid Profile No results for input(s): CHOL, HDL, LDLCALC, TRIG, CHOLHDL, LDLDIRECT in the last 72 hours. Thyroid function studies No results for input(s): TSH, T4TOTAL, T3FREE, THYROIDAB in the last 72 hours.  Invalid input(s): FREET3 Anemia work up No results for input(s): VITAMINB12, FOLATE, FERRITIN, TIBC, IRON, RETICCTPCT in the last 72 hours. Urinalysis    Component Value Date/Time   COLORURINE Yellow 12/12/2013 1512   APPEARANCEUR Clear 03/26/2015 1132   LABSPEC 1.006 12/12/2013 1512   PHURINE 5.0 12/12/2013 1512    GLUCOSEU Negative 03/26/2015 1132   GLUCOSEU Negative 12/12/2013 1512   HGBUR Negative 12/12/2013 1512   BILIRUBINUR Negative 03/26/2015 1132   BILIRUBINUR Negative 12/12/2013 1512   KETONESUR Negative 12/12/2013 1512   PROTEINUR Negative 03/26/2015 1132   PROTEINUR Negative 12/12/2013 1512   NITRITE Negative 03/26/2015 1132  NITRITE Negative 12/12/2013 1512   LEUKOCYTESUR Trace (A) 03/26/2015 1132   LEUKOCYTESUR Trace 12/12/2013 1512   Sepsis Labs Invalid input(s): PROCALCITONIN,  WBC,  LACTICIDVEN Microbiology Recent Results (from the past 240 hour(s))  SARS CORONAVIRUS 2 (TAT 6-24 HRS) Nasopharyngeal Nasopharyngeal Swab     Status: None   Collection Time: 05/23/19  8:30 PM   Specimen: Nasopharyngeal Swab  Result Value Ref Range Status   SARS Coronavirus 2 NEGATIVE NEGATIVE Final    Comment: (NOTE) SARS-CoV-2 target nucleic acids are NOT DETECTED. The SARS-CoV-2 RNA is generally detectable in upper and lower respiratory specimens during the acute phase of infection. Negative results do not preclude SARS-CoV-2 infection, do not rule out co-infections with other pathogens, and should not be used as the sole basis for treatment or other patient management decisions. Negative results must be combined with clinical observations, patient history, and epidemiological information. The expected result is Negative. Fact Sheet for Patients: SugarRoll.be Fact Sheet for Healthcare Providers: https://www.woods-mathews.com/ This test is not yet approved or cleared by the [place] FDA and  has been authorized for detection and/or diagnosis of SARS-CoV-2 by FDA under an Emergency Use Authorization (EUA). This EUA will remain  in effect (meaning this test can be used) for the duration of the COVID-19 declaration under Section 56 4(b)(1) of the Act, 21 U.S.C. section 360bbb-3(b)(1), unless the authorization is terminated or revoked  sooner. Performed at [facility], [place] [address], [place], [place] [address]      Time coordinating discharge: Over 30 minutes  SIGNED:   Sidney Ace, MD  [facility] 05/27/2019, 4:32 PM Pager   If 7PM-7AM, please contact night-coverage

## 2019-05-27 NOTE — Progress Notes (Signed)
Montie Huebsch A and O x4. VSS. Pt tolerating diet well. No complaints of nausea or vomiting. IV removed intact, prescriptions given. Pt voices understanding of discharge instructions with no further questions. Patient discharged via wheelchair with NT  Allergies as of 05/27/2019      Reactions   Morphine And Related Other (See Comments)   "nightmares"      Medication List    TAKE these medications   allopurinol 300 MG tablet Commonly known as: ZYLOPRIM Take 150 mg by mouth daily.   aspirin EC 81 MG tablet Take 81 mg by mouth at bedtime.   finasteride 5 MG tablet Commonly known as: PROSCAR Take 1 tablet (5 mg total) by mouth daily.   Fish Oil 1000 MG Caps Take 1 capsule by mouth daily.   losartan 25 MG tablet Commonly known as: COZAAR Take 25 mg by mouth daily.   metoprolol succinate 25 MG 24 hr tablet Commonly known as: TOPROL-XL Take 25 mg by mouth daily.   Multi-Vitamins Tabs Take 1 tablet by mouth daily.   pantoprazole 40 MG tablet Commonly known as: PROTONIX Take 40 mg by mouth daily.   simvastatin 40 MG tablet Commonly known as: ZOCOR Take 40 mg by mouth daily at 6 PM.   tamsulosin 0.4 MG Caps capsule Commonly known as: FLOMAX Take 1 capsule (0.4 mg total) by mouth daily.   torsemide 20 MG tablet Commonly known as: DEMADEX Take 2 tablets (40 mg total) by mouth daily. Start taking on: May 28, 2019 What changed: how much to take   warfarin 1 MG tablet Commonly known as: COUMADIN Take 1 mg by mouth daily.   warfarin 3 MG tablet Commonly known as: COUMADIN Take 3 mg by mouth daily at 6 PM.       Vitals:   05/27/19 1018 05/27/19 1207  BP: (!) 103/52 106/67  Pulse: (!) 59 74  Resp:    Temp:    SpO2:      Darnelle Catalan

## 2019-05-27 NOTE — Progress Notes (Signed)
ANTICOAGULATION CONSULT NOTE - Initial Consult  Pharmacy Consult for warfarin Indication: atrial fibrillation  Allergies  Allergen Reactions  . Morphine And Related Other (See Comments)    "nightmares"    Patient Measurements: Height: 5\' 7"  (170.2 cm) Weight: 214 lb 8.1 oz (97.3 kg) IBW/kg (Calculated) : 66.1  Vital Signs: Temp: 97.9 F (36.6 C) (03/29 0455) Temp Source: Oral (03/29 0455) BP: 113/52 (03/29 0455) Pulse Rate: 79 (03/29 0455)  Labs: Recent Labs    05/25/19 0457 05/26/19 0512 05/27/19 0505  HGB  --  10.8*  --   HCT  --  33.3*  --   PLT  --  182  --   LABPROT 24.4* 21.1* 20.6*  INR 2.2* 1.8* 1.8*  CREATININE 1.91* 1.95* 2.17*    Estimated Creatinine Clearance: 24.7 mL/min (A) (by C-G formula based on SCr of 2.17 mg/dL (H)).   Medical History: Past Medical History:  Diagnosis Date  . Arthritis   . Atrial fibrillation ([place])   . CHF (congestive heart failure) ([place])   . Chronic kidney disease    kidney stones  . Coronary artery disease   . Diabetes mellitus without complication ([place])   . Diverticulosis   . GERD (gastroesophageal reflux disease)   . Gout   . Hyperlipidemia   . Hypertension   . Myocardial infarction ([place]) 2001  . Peripheral vascular disease ([place])   . Pneumothorax, left 1950    Medications:  Scheduled:  . allopurinol  50 mg Oral Daily  . aspirin EC  81 mg Oral QHS  . cephALEXin  500 mg Oral Q12H  . finasteride  5 mg Oral Daily  . insulin aspart  0-5 Units Subcutaneous QHS  . insulin aspart  0-9 Units Subcutaneous TID WC  . losartan  25 mg Oral Daily  . metoprolol succinate  25 mg Oral Daily  . multivitamin with minerals  1 tablet Oral Daily  . omega-3 acid ethyl esters  1 g Oral Daily  . pantoprazole  40 mg Oral Daily  . simvastatin  40 mg Oral q1800  . sodium chloride flush  3 mL Intravenous Q12H  . tamsulosin  0.4 mg Oral Daily  . torsemide  40 mg Oral BID  . Warfarin - Pharmacist Dosing Inpatient   Does not apply  q1800    Assessment: Patient here for increased lower extremity edema w/ h/o afib anticoagulated w/ wafarin 3 mg daily and 1 mg as needed? Patient's INR is currently 3.2.  Date INR Warfarin Dose  3/25 3.2 Held  3/26 2.9 Held  3/27 2.2 3 mg  3/28 1.8 3 mg     Goal of Therapy:  INR 2-3 Monitor platelets by anticoagulation protocol: Yes   Plan:  INR is subtherapeutic. Will increase dose tonight to warfarin 4 mg tonight. Daily INR ordered.  Will monitor CBC every 3 days. and adjust per INR trends.  Pearla Dubonnet, PharmD Clinical Pharmacist 05/27/2019 7:47 AM

## 2019-05-27 NOTE — Progress Notes (Signed)
[facility] [place] 05/27/19  Subjective:   LOS: 4 Patient doing much better as compared to admission. Was eating a significant amount of [employer] as outpt. His edema appears to be now down to baseline.   Objective:  Vital signs in last 24 hours:  Temp:  [97.9 F (36.6 C)-98 F (36.7 C)] 97.9 F (36.6 C) (03/29 0455) Pulse Rate:  [59-79] 74 (03/29 1207) Resp:  [20] 20 (03/29 0455) BP: (103-124)/(52-67) 106/67 (03/29 1207) SpO2:  [94 %-95 %] 94 % (03/29 0455)  Weight change:  Filed Weights   05/24/19 0500 05/25/19 0534 05/26/19 0533  Weight: 102.2 kg 100.7 kg 97.3 kg    Intake/Output:   No intake or output data in the 24 hours ending 05/27/19 1412   Physical Exam: General:  No acute distress, sitting up on the side of the bed  HEENT  anicteric, moist oral mucous membrane  Pulm/lungs  lungs are clear to auscultation  CVS/Heart  no rub  Abdomen:   Soft, nontender  Extremities:  trace pitting edema bilaterally  Neurologic:  Alert, oriented  Skin:  Warm, dry, erythema over lower legs          Basic Metabolic Panel:  Recent Labs  Lab 05/23/19 1847 05/23/19 1847 05/24/19 0555 05/24/19 0555 05/25/19 0457 05/26/19 0512 05/27/19 0505  NA 143  --  144  --  143 142 143  K 4.1  --  3.6  --  3.5 3.6 3.7  CL 105  --  107  --  104 104 102  CO2 27  --  29  --  28 32 29  GLUCOSE 108*  --  109*  --  97 115* 106*  BUN 30*  --  28*  --  28* 33* 40*  CREATININE 2.21*  --  2.01*  --  1.91* 1.95* 2.17*  CALCIUM 8.9   < > 8.7*   < > 9.0 9.1 9.2  MG  --   --  2.0  --  2.2 2.2 2.2   < > = values in this interval not displayed.     CBC: Recent Labs  Lab 05/23/19 1847 05/26/19 0512  WBC 4.9 4.5  NEUTROABS 3.2  --   HGB 10.9* 10.8*  HCT 33.8* 33.3*  MCV 101.8* 101.5*  PLT 178 182     No results found for: HEPBSAG, HEPBSAB, HEPBIGM    Microbiology:  Recent Results (from the past 240 hour(s))  SARS CORONAVIRUS 2 (TAT 6-24  HRS) Nasopharyngeal Nasopharyngeal Swab     Status: None   Collection Time: 05/23/19  8:30 PM   Specimen: Nasopharyngeal Swab  Result Value Ref Range Status   SARS Coronavirus 2 NEGATIVE NEGATIVE Final    Comment: (NOTE) SARS-CoV-2 target nucleic acids are NOT DETECTED. The SARS-CoV-2 RNA is generally detectable in upper and lower respiratory specimens during the acute phase of infection. Negative results do not preclude SARS-CoV-2 infection, do not rule out co-infections with other pathogens, and should not be used as the sole basis for treatment or other patient management decisions. Negative results must be combined with clinical observations, patient history, and epidemiological information. The expected result is Negative. Fact Sheet for Patients: SugarRoll.be Fact Sheet for Healthcare Providers: https://www.woods-mathews.com/ This test is not yet approved or cleared by the [place] FDA and  has been authorized for detection and/or diagnosis of SARS-CoV-2 by FDA under an Emergency Use Authorization (EUA). This EUA will remain  in effect (meaning this test  can be used) for the duration of the COVID-19 declaration under Section 56 4(b)(1) of the Act, 21 U.S.C. section 360bbb-3(b)(1), unless the authorization is terminated or revoked sooner. Performed at [facility], [place] [address], [place], [place] [address]     Coagulation Studies: Recent Labs    05/25/19 0457 05/26/19 0512 05/27/19 0505  LABPROT 24.4* 21.1* 20.6*  INR 2.2* 1.8* 1.8*    Urinalysis: No results for input(s): COLORURINE, LABSPEC, PHURINE, GLUCOSEU, HGBUR, BILIRUBINUR, KETONESUR, PROTEINUR, UROBILINOGEN, NITRITE, LEUKOCYTESUR in the last 72 hours.  Invalid input(s): APPERANCEUR    Imaging: No results found.   Medications:   . sodium chloride     . allopurinol  50 mg Oral Daily  . aspirin EC  81 mg Oral QHS  . cephALEXin  500 mg Oral Q12H  .  finasteride  5 mg Oral Daily  . insulin aspart  0-5 Units Subcutaneous QHS  . insulin aspart  0-9 Units Subcutaneous TID WC  . losartan  25 mg Oral Daily  . metoprolol succinate  25 mg Oral Daily  . multivitamin with minerals  1 tablet Oral Daily  . omega-3 acid ethyl esters  1 g Oral Daily  . pantoprazole  40 mg Oral Daily  . simvastatin  40 mg Oral q1800  . sodium chloride flush  3 mL Intravenous Q12H  . tamsulosin  0.4 mg Oral Daily  . [START ON 05/28/2019] torsemide  40 mg Oral Daily  . warfarin  4 mg Oral ONCE-1600  . Warfarin - Pharmacist Dosing Inpatient   Does not apply q1800   sodium chloride, acetaminophen, ondansetron (ZOFRAN) IV, sodium chloride flush  Assessment/ Plan:  86 y.o. male with medical problems and past medical history of  Atrial fibrillation Type 2 diabetes Gout Hypertension GERD Coronary disease/MI Peripheral vascular disease Congestive heart failure.  2D echo May 24, 2019: LVEF 60 to 46%, grade 1 diastolic dysfunction   Principal Problem:   Acute on chronic combined systolic and diastolic CHF (congestive heart failure) (HCC) Active Problems:   Essential (primary) hypertension   AF (paroxysmal atrial fibrillation) (HCC)   Diabetes mellitus, type 2 (HCC)   Hyperlipidemia   CKD (chronic kidney disease), stage IV ([place])   #. CKD st 4 with volume overload Baseline creatinine of 1.94/GFR 30 from April 02, 2019.  Usual GFR range 26-29 Recent Labs    05/24/19 0555 05/25/19 0457 05/26/19 0512 05/27/19 0505  CREATININE 2.01* 1.91* 1.95* 2.17*  Patient transition now to p.o. diuretics. We will need close outpatient follow-up.  #. Anemia of CKD  Lab Results  Component Value Date   HGB 10.8 (L) 05/26/2019   Continue to monitor hemoglobin as an outpatient.  #. Diabetes type 2 with CKD Hgb A1c MFr Bld (%)  Date Value  05/23/2019 6.0 (H)  Glycemic control as per hospitalist.   LOS: 4 Carlos [name] 3/29/20212:12 PM  [place], [place]

## 2019-05-28 ENCOUNTER — Other Ambulatory Visit: Payer: Medicare Other

## 2019-05-28 ENCOUNTER — Inpatient Hospital Stay: Admission: RE | Admit: 2019-05-28 | Payer: Medicare Other | Source: Ambulatory Visit

## 2019-05-30 ENCOUNTER — Telehealth: Payer: Self-pay | Admitting: Family

## 2019-05-30 ENCOUNTER — Ambulatory Visit: Payer: Medicare Other | Admitting: Family

## 2019-05-30 NOTE — Telephone Encounter (Signed)
Patient cancelled his appointment as he felt it was not needed at the time for the new patient CHF Clinic appointment we made after the referral we received when he was discharged from the hospital.    Alyse Low, NT

## 2019-06-02 ENCOUNTER — Emergency Department
Admission: EM | Admit: 2019-06-02 | Discharge: 2019-06-02 | Disposition: A | Discharge status: Home / Self Care | Payer: Medicare Other | Attending: Emergency Medicine | Admitting: Emergency Medicine

## 2019-06-02 ENCOUNTER — Other Ambulatory Visit: Payer: Self-pay

## 2019-06-02 ENCOUNTER — Emergency Department: Payer: Medicare Other

## 2019-06-02 ENCOUNTER — Encounter: Payer: Self-pay | Admitting: *Deleted

## 2019-06-02 DIAGNOSIS — Z79899 Other long term (current) drug therapy: Secondary | ICD-10-CM | POA: Diagnosis not present

## 2019-06-02 DIAGNOSIS — N184 Chronic kidney disease, stage 4 (severe): Secondary | ICD-10-CM | POA: Insufficient documentation

## 2019-06-02 DIAGNOSIS — I509 Heart failure, unspecified: Secondary | ICD-10-CM | POA: Diagnosis not present

## 2019-06-02 DIAGNOSIS — Z7901 Long term (current) use of anticoagulants: Secondary | ICD-10-CM | POA: Insufficient documentation

## 2019-06-02 DIAGNOSIS — Y999 Unspecified external cause status: Secondary | ICD-10-CM | POA: Diagnosis not present

## 2019-06-02 DIAGNOSIS — W25XXXA Contact with sharp glass, initial encounter: Secondary | ICD-10-CM | POA: Insufficient documentation

## 2019-06-02 DIAGNOSIS — Y939 Activity, unspecified: Secondary | ICD-10-CM | POA: Diagnosis not present

## 2019-06-02 DIAGNOSIS — E1122 Type 2 diabetes mellitus with diabetic chronic kidney disease: Secondary | ICD-10-CM | POA: Insufficient documentation

## 2019-06-02 DIAGNOSIS — Z7982 Long term (current) use of aspirin: Secondary | ICD-10-CM | POA: Diagnosis not present

## 2019-06-02 DIAGNOSIS — I130 Hypertensive heart and chronic kidney disease with heart failure and stage 1 through stage 4 chronic kidney disease, or unspecified chronic kidney disease: Secondary | ICD-10-CM | POA: Diagnosis not present

## 2019-06-02 DIAGNOSIS — Y929 Unspecified place or not applicable: Secondary | ICD-10-CM | POA: Insufficient documentation

## 2019-06-02 DIAGNOSIS — Z87891 Personal history of nicotine dependence: Secondary | ICD-10-CM | POA: Diagnosis not present

## 2019-06-02 DIAGNOSIS — S61213A Laceration without foreign body of left middle finger without damage to nail, initial encounter: Secondary | ICD-10-CM | POA: Insufficient documentation

## 2019-06-02 NOTE — ED Triage Notes (Signed)
Pt cut 3rd digit on broken wine glass. Pt takes coumadin and bleeding is poorly controlled.

## 2019-06-02 NOTE — ED Notes (Signed)
Daughter: [phone]

## 2019-06-02 NOTE — ED Provider Notes (Signed)
Emergency Department Provider Note  ____________________________________________  Time seen: Approximately 7:54 PM  I have reviewed the triage vital signs and the nursing notes.   HISTORY  Chief Complaint Extremity Laceration   Historian Patient    HPI Carlos [name] is a 86 y.o. male presents to the ED with a 1.5 cm laceration of left third digit.  Patient is well approximated and linear.  Bleeding is controlled.  Patient is currently taking Coumadin.  No numbness or tingling in the fingertips.   Past Medical History:  Diagnosis Date  . Arthritis   . Atrial fibrillation ([place])   . CHF (congestive heart failure) ([place])   . Chronic kidney disease    kidney stones  . Coronary artery disease   . Diabetes mellitus without complication ([place])   . Diverticulosis   . GERD (gastroesophageal reflux disease)   . Gout   . Hyperlipidemia   . Hypertension   . Myocardial infarction ([place]) 2001  . Peripheral vascular disease ([place])   . Pneumothorax, left 1950     Immunizations up to date:  Yes.     Past Medical History:  Diagnosis Date  . Arthritis   . Atrial fibrillation ([place])   . CHF (congestive heart failure) ([place])   . Chronic kidney disease    kidney stones  . Coronary artery disease   . Diabetes mellitus without complication ([place])   . Diverticulosis   . GERD (gastroesophageal reflux disease)   . Gout   . Hyperlipidemia   . Hypertension   . Myocardial infarction ([place]) 2001  . Peripheral vascular disease ([place])   . Pneumothorax, left 1950    Patient Active Problem List   Diagnosis Date Noted  . Acute on chronic combined systolic and diastolic CHF (congestive heart failure) ([place]) 05/23/2019  . Acute pain of left shoulder 09/08/2015  . CKD (chronic kidney disease), stage IV ([place]) 12/24/2014  . Bradycardia 12/02/2014  . TI (tricuspid incompetence) 10/21/2014  . DD (diverticular disease) 07/30/2014  . Essential (primary) hypertension 07/30/2014  . Tuberculosis  07/30/2014  . Hyperlipidemia 07/30/2014  . Chronic systolic CHF (congestive heart failure), NYHA class 2 ([place]) 02/12/2014  . AF (paroxysmal atrial fibrillation) ([place]) 02/12/2014  . Paroxysmal A-fib ([place]) 02/12/2014  . Accumulation of fluid in tissues 07/24/2013  . Acid reflux 07/24/2013  . Gout 07/24/2013  . Diabetes mellitus, type 2 ([place]) 07/24/2013  . Type 2 diabetes mellitus with renal manifestations, controlled ([place]) 07/24/2013  . Peripheral blood vessel disorder ([place]) 07/19/2013  . PVD (peripheral vascular disease) ([place]) 07/19/2013  . Combined fat and carbohydrate induced hyperlipemia 05/21/2013  . Encounter for therapeutic drug monitoring 12/18/2012  . Chronic kidney disease (CKD), stage IV (severe) ([place]) 12/18/2012  . Arteriosclerosis of coronary artery 12/18/2012  . Decreased potassium in the blood 12/18/2012  . H/O coronary artery bypass surgery 12/18/2012  . H/O total knee replacement 12/18/2012  . Encounter for therapeutic drug level monitoring 12/18/2012    Past Surgical History:  Procedure Laterality Date  . CARDIAC CATHETERIZATION    . CARDIAC SURGERY     4 bypass  . CHOLECYSTECTOMY    . CORONARY ARTERY BYPASS GRAFT    . CYSTOSCOPY W/ RETROGRADES N/A 12/03/2014   Procedure: CYSTOSCOPY WITH ATTEMPT FOR RETROGRADE PYELOGRAM;  Surgeon: [name], MD;  Location: [facility] ORS;  Service: Urology;  Laterality: N/A;  . CYSTOSCOPY WITH LITHOLAPAXY N/A 12/03/2014   Procedure: CYSTOSCOPY WITH LITHOLAPAXY WITH HOLMIUM LASER ;  Surgeon: [name], MD;  Location: [facility] ORS;  Service: Urology;  Laterality: N/A;  . FEMUR SURGERY    . GALLBLADDER SURGERY    . HERNIA REPAIR    . JOINT REPLACEMENT Right    Total Knee Replacement  . LUNG SURGERY Left   . REPLACEMENT TOTAL KNEE      Prior to Admission medications   Medication Sig Start Date End Date Taking? Authorizing Provider  allopurinol (ZYLOPRIM) 300 MG tablet Take 150 mg by mouth daily.  05/09/14   [provider]  aspirin EC 81 MG tablet Take 81 mg by mouth at bedtime.     [provider]  finasteride (PROSCAR) 5 MG tablet Take 1 tablet (5 mg total) by mouth daily. 08/13/18   Billey Co, MD  losartan (COZAAR) 25 MG tablet Take 25 mg by mouth daily. 05/10/19   [provider]  metoprolol succinate (TOPROL-XL) 25 MG 24 hr tablet Take 25 mg by mouth daily. 05/10/19   [provider]  Multiple Vitamin (MULTI-VITAMINS) TABS Take 1 tablet by mouth daily.     [provider]  Omega-3 Fatty Acids (FISH OIL) 1000 MG CAPS Take 1 capsule by mouth daily.    [provider]  pantoprazole (PROTONIX) 40 MG tablet Take 40 mg by mouth daily. 04/26/19   [provider]  simvastatin (ZOCOR) 40 MG tablet Take 40 mg by mouth daily at 6 PM.  07/07/14   [provider]  tamsulosin (FLOMAX) 0.4 MG CAPS capsule Take 1 capsule (0.4 mg total) by mouth daily. 08/13/18   Billey Co, MD  torsemide (DEMADEX) 20 MG tablet Take 2 tablets (40 mg total) by mouth daily. 05/28/19 06/27/19  Sidney Ace, MD  warfarin (COUMADIN) 1 MG tablet Take 1 mg by mouth daily.    [provider]  warfarin (COUMADIN) 3 MG tablet Take 3 mg by mouth daily at 6 PM.  05/02/14   [provider]  colchicine 0.6 MG tablet Take 0.6 mg by mouth as needed.   08/28/18  [provider]    Allergies Morphine and related  Family History  Problem Relation Age of Onset  . Lung cancer Mother   . Tuberculosis Brother     Social History Social History   Tobacco Use  . Smoking status: Former Smoker    Types: Pipe    Quit date: 07/30/2006    Years since quitting: 12.8  . Smokeless tobacco: Never Used  Substance Use Topics  . Alcohol use: Yes    Alcohol/week: 14.0 standard drinks    Types: 14 Glasses of wine per week    Comment: one glass of wine daily  . Drug use: No     Review of Systems  Constitutional: No fever/chills Eyes:  No  discharge ENT: No upper respiratory complaints. Respiratory: no cough. No SOB/ use of accessory muscles to breath Gastrointestinal:   No nausea, no vomiting.  No diarrhea.  No constipation. Musculoskeletal: Patient has left third digit pain.  Skin: Patient has left third digit laceration.    ____________________________________________   PHYSICAL EXAM:  VITAL SIGNS: ED Triage Vitals  Enc Vitals Group     BP 06/02/19 1908 (!) 125/51     Pulse Rate 06/02/19 1908 80     Resp 06/02/19 1908 (!) 156     Temp 06/02/19 1908 98.5 F (36.9 C)     Temp Source 06/02/19 1908 Oral     SpO2 06/02/19 1908 94 %     Weight 06/02/19 1909 215 lb (  97.5 kg)     Height 06/02/19 1909 5\' 7"  (1.702 m)     Head Circumference --      Peak Flow --      Pain Score 06/02/19 1909 0     Pain Loc --      Pain Edu? --      Excl. in [place]? --      Constitutional: Alert and oriented. Well appearing and in no acute distress. Eyes: Conjunctivae are normal. PERRL. EOMI. Head: Atraumatic. Cardiovascular: Normal rate, regular rhythm. Normal S1 and S2.  Good peripheral circulation. Respiratory: Normal respiratory effort without tachypnea or retractions. Lungs CTAB. Good air entry to the bases with no decreased or absent breath sounds Gastrointestinal: Bowel sounds x 4 quadrants. Soft and nontender to palpation. No guarding or rigidity. No distention. Musculoskeletal: No flexor or extensor tendon deficits appreciated with left third digit testing.  Patient has a 1 and half centimeter linear laceration at left distal fingertip that is well approximated. Neurologic:  Normal for age. No gross focal neurologic deficits are appreciated.  Skin:  Skin is warm, dry and intact. No rash noted. Psychiatric: Mood and affect are normal for age. Speech and behavior are normal.   ____________________________________________   LABS (all labs ordered are listed, but only abnormal results are displayed)  Labs Reviewed - No data to  display ____________________________________________  EKG   ____________________________________________  RADIOLOGY Unk Pinto, personally viewed and evaluated these images (plain radiographs) as part of my medical decision making, as well as reviewing the written report by the radiologist.    DG Finger Middle Left  Result Date: 06/02/2019 CLINICAL DATA:  Laceration EXAM: LEFT MIDDLE FINGER 2+V COMPARISON:  None. FINDINGS: Advanced osteoarthritic changes in the D IP and PIP joints of the left middle finger. There is a fracture through the dorsal base of the left distal phalanx. No subluxation or dislocation. IMPRESSION: Fracture through the base of the left middle finger distal phalanx dorsally. Advanced osteoarthritis. Electronically Signed   By: Rolm Baptise M.D.   On: 06/02/2019 20:10    ____________________________________________    PROCEDURES  Procedure(s) performed:     Marland KitchenMarland KitchenLaceration Repair  Date/Time: 06/02/2019 7:58 PM Performed by: Lannie Fields, PA-C Authorized by: Lannie Fields, PA-C   Consent:    Consent obtained:  Verbal   Consent given by:  Patient Anesthesia (see MAR for exact dosages):    Anesthesia method:  None Laceration details:    Location:  Finger   Finger location:  L long finger   Length (cm):  1.5   Depth (mm):  1 Repair type:    Repair type:  Simple Exploration:    Contaminated: no   Treatment:    Area cleansed with:  Betadine   Amount of cleaning:  Standard   Irrigation solution:  Sterile saline   Visualized foreign bodies/material removed: no   Skin repair:    Repair method:  Tissue adhesive Approximation:    Approximation:  Close Post-procedure details:    Dressing:  Open (no dressing)   Patient tolerance of procedure:  Tolerated well, no immediate complications       Medications - No data to display   ____________________________________________   INITIAL IMPRESSION / ASSESSMENT AND PLAN / ED  COURSE  Pertinent labs & imaging results that were available during my care of the patient were reviewed by me and considered in my medical decision making (see chart for details).      Assessment and Plan:  Laceration:  86 year old male presents to the emergency department with a superficial laceration along the volar aspect of the left third digit sustained accidentally with a broken wine glass.  Laceration was repaired easily using Dermabond.  X-ray of the left third digit was obtained which did reveal a distal phalanx fracture that appears remote.  Patient education regarding basic wound care was given.  Return precautions were given to return to the emergency department with new or worsening symptoms.  All patient questions were answered.   ____________________________________________  FINAL CLINICAL IMPRESSION(S) / ED DIAGNOSES  Final diagnoses:  Laceration of left middle finger without foreign body without damage to nail, initial encounter      NEW MEDICATIONS STARTED DURING THIS VISIT:  ED Discharge Orders    None          This chart was dictated using voice recognition software/Dragon. Despite best efforts to proofread, errors can occur which can change the meaning. Any change was purely unintentional.     Lannie Fields, PA-C 06/02/19 2019    Nance Pear, MD 06/02/19 2036

## 2019-07-01 ENCOUNTER — Ambulatory Visit: Payer: Medicare Other | Admitting: Physical Therapy

## 2019-07-03 ENCOUNTER — Encounter: Payer: Medicare Other | Admitting: Physical Therapy

## 2019-07-08 ENCOUNTER — Encounter: Payer: Medicare Other | Admitting: Physical Therapy

## 2019-07-10 ENCOUNTER — Encounter: Payer: Medicare Other | Admitting: Physical Therapy

## 2019-07-10 ENCOUNTER — Ambulatory Visit
Admission: RE | Admit: 2019-07-10 | Discharge: 2019-07-10 | Disposition: A | Discharge status: Home / Self Care | Payer: Medicare Other | Source: Ambulatory Visit | Attending: Family Medicine | Admitting: Family Medicine

## 2019-07-10 DIAGNOSIS — N62 Hypertrophy of breast: Secondary | ICD-10-CM | POA: Diagnosis not present

## 2019-07-10 DIAGNOSIS — N6342 Unspecified lump in left breast, subareolar: Secondary | ICD-10-CM | POA: Insufficient documentation

## 2019-07-10 DIAGNOSIS — N6310 Unspecified lump in the right breast, unspecified quadrant: Secondary | ICD-10-CM | POA: Diagnosis not present

## 2019-07-10 DIAGNOSIS — N6320 Unspecified lump in the left breast, unspecified quadrant: Secondary | ICD-10-CM | POA: Diagnosis present

## 2019-07-15 ENCOUNTER — Encounter: Payer: Medicare Other | Admitting: Physical Therapy

## 2019-07-17 ENCOUNTER — Encounter: Payer: Medicare Other | Admitting: Physical Therapy

## 2019-07-22 ENCOUNTER — Encounter: Payer: Medicare Other | Admitting: Physical Therapy

## 2019-07-24 ENCOUNTER — Encounter: Payer: Medicare Other | Admitting: Physical Therapy

## 2019-08-13 ENCOUNTER — Ambulatory Visit: Payer: Medicare Other | Admitting: Urology

## 2019-09-05 ENCOUNTER — Other Ambulatory Visit: Payer: Self-pay

## 2019-09-05 ENCOUNTER — Emergency Department: Payer: Medicare Other

## 2019-09-05 ENCOUNTER — Encounter: Payer: Self-pay | Admitting: Emergency Medicine

## 2019-09-05 ENCOUNTER — Emergency Department
Admission: EM | Admit: 2019-09-05 | Discharge: 2019-09-05 | Disposition: A | Discharge status: Home / Self Care | Payer: Medicare Other | Attending: Emergency Medicine | Admitting: Emergency Medicine

## 2019-09-05 DIAGNOSIS — Z7901 Long term (current) use of anticoagulants: Secondary | ICD-10-CM | POA: Diagnosis not present

## 2019-09-05 DIAGNOSIS — Z951 Presence of aortocoronary bypass graft: Secondary | ICD-10-CM | POA: Insufficient documentation

## 2019-09-05 DIAGNOSIS — R600 Localized edema: Secondary | ICD-10-CM | POA: Insufficient documentation

## 2019-09-05 DIAGNOSIS — N184 Chronic kidney disease, stage 4 (severe): Secondary | ICD-10-CM | POA: Diagnosis not present

## 2019-09-05 DIAGNOSIS — I5043 Acute on chronic combined systolic (congestive) and diastolic (congestive) heart failure: Secondary | ICD-10-CM | POA: Diagnosis not present

## 2019-09-05 DIAGNOSIS — I130 Hypertensive heart and chronic kidney disease with heart failure and stage 1 through stage 4 chronic kidney disease, or unspecified chronic kidney disease: Secondary | ICD-10-CM | POA: Insufficient documentation

## 2019-09-05 DIAGNOSIS — Z87891 Personal history of nicotine dependence: Secondary | ICD-10-CM | POA: Insufficient documentation

## 2019-09-05 DIAGNOSIS — E1129 Type 2 diabetes mellitus with other diabetic kidney complication: Secondary | ICD-10-CM | POA: Diagnosis not present

## 2019-09-05 DIAGNOSIS — Z96659 Presence of unspecified artificial knee joint: Secondary | ICD-10-CM | POA: Diagnosis not present

## 2019-09-05 DIAGNOSIS — I2510 Atherosclerotic heart disease of native coronary artery without angina pectoris: Secondary | ICD-10-CM | POA: Diagnosis not present

## 2019-09-05 LAB — HEPATIC FUNCTION PANEL
ALT: 18 U/L (ref 0–44)
AST: 29 U/L (ref 15–41)
Albumin: 3.8 g/dL (ref 3.5–5.0)
Alkaline Phosphatase: 54 U/L (ref 38–126)
Bilirubin, Direct: 0.2 mg/dL (ref 0.0–0.2)
Indirect Bilirubin: 0.7 mg/dL (ref 0.3–0.9)
Total Bilirubin: 0.9 mg/dL (ref 0.3–1.2)
Total Protein: 6.7 g/dL (ref 6.5–8.1)

## 2019-09-05 LAB — BASIC METABOLIC PANEL
Anion gap: 9 (ref 5–15)
BUN: 51 mg/dL — ABNORMAL HIGH (ref 8–23)
CO2: 31 mmol/L (ref 22–32)
Calcium: 9.2 mg/dL (ref 8.9–10.3)
Chloride: 101 mmol/L (ref 98–111)
Creatinine, Ser: 2.11 mg/dL — ABNORMAL HIGH (ref 0.61–1.24)
GFR calc Af Amer: 31 mL/min — ABNORMAL LOW (ref >60)
GFR calc non Af Amer: 27 mL/min — ABNORMAL LOW (ref >60)
Glucose, Bld: 114 mg/dL — ABNORMAL HIGH (ref 70–99)
Potassium: 3.9 mmol/L (ref 3.5–5.1)
Sodium: 141 mmol/L (ref 135–145)

## 2019-09-05 LAB — CBC
HCT: 32.2 % — ABNORMAL LOW (ref 39.0–52.0)
Hemoglobin: 10.9 g/dL — ABNORMAL LOW (ref 13.0–17.0)
MCH: 32.4 pg (ref 26.0–34.0)
MCHC: 33.9 g/dL (ref 30.0–36.0)
MCV: 95.8 fL (ref 80.0–100.0)
Platelets: 182 10*3/uL (ref 150–400)
RBC: 3.36 MIL/uL — ABNORMAL LOW (ref 4.22–5.81)
RDW: 14.4 % (ref 11.5–15.5)
WBC: 5.2 10*3/uL (ref 4.0–10.5)
nRBC: 0 % (ref 0.0–0.2)

## 2019-09-05 LAB — PROTIME-INR
INR: 3.2 — ABNORMAL HIGH (ref 0.8–1.2)
Prothrombin Time: 31.7 seconds — ABNORMAL HIGH (ref 11.4–15.2)

## 2019-09-05 LAB — BRAIN NATRIURETIC PEPTIDE: B Natriuretic Peptide: 183 pg/mL — ABNORMAL HIGH (ref 0.0–100.0)

## 2019-09-05 MED ORDER — SODIUM CHLORIDE 0.9% FLUSH: 3.0000 mL | Freq: Once | INTRAVENOUS | Status: DC

## 2019-09-05 NOTE — ED Triage Notes (Signed)
C/O lower extremity swelling.  Sent to ED by PCP for evaluation.

## 2019-09-05 NOTE — ED Notes (Addendum)
See triage note, pt reports was trying to go see his PCP today for increased swelling in BLE when he was referred to ED for assessment. Pt reports has been having swelling in BLE for months. Per pt, "they increased my water pill a month ago", states he has been compliant with medications.  Denies SHOB "more than usual", fevers or pain Swelling noted to BLE, no redness or heat noted.  Pt alert and oriented.  RR even and unlabored

## 2019-09-05 NOTE — Discharge Instructions (Addendum)
Starting tomorrow I want you to take your torsemide twice daily. This means taking it once at 8 AM and once at 2 PM. I want to see if this helps decrease your leg swelling. You will need to do this for 3 days max, so for Friday, Saturday, Sunday. Then resume your normal dosing on Monday. You should call Dr. Elwyn Lade office tomorrow to get follow-up for Monday. They will need to recheck your kidney function. If it is going up or your swelling is not getting better he may need to come back in for IV diuresis. I have also referred you to the heart failure clinic to get follow-up.  Keep your legs elevated, wear compression socks.

## 2019-09-05 NOTE — ED Provider Notes (Signed)
[facility] Emergency Department Provider Note  ____________________________________________   First MD Initiated Contact with Patient 09/05/19 1747     (approximate)  I have reviewed the triage vital signs and the nursing notes.   HISTORY  Chief Complaint Leg Swelling    HPI Carlos [name] is a 86 y.o. male with A. fib, CHF, CKD diabetes, hypertension, hyperlipidemia who comes in with leg swelling.  Patient is on warfarin. Patient reports having leg swelling for months. States that he was here a few months ago got some IV diuresis and it helped but unfortunately his legs have slowly been regaining since then. It appears the patient is followed by Dr. Holley Raring. Patient's last echo shows an EF of 60 to 38% grade 1 diastolic dysfunction. Patient did have a slight bump in his creatinine after twice daily torsemide and so was decreased to 40 mg once daily. Patient denies any recent falls.  Patient is on torsemide 40 mg once daily          Past Medical History:  Diagnosis Date  . Arthritis   . Atrial fibrillation ([place])   . CHF (congestive heart failure) ([place])   . Chronic kidney disease    kidney stones  . Coronary artery disease   . Diabetes mellitus without complication ([place])   . Diverticulosis   . GERD (gastroesophageal reflux disease)   . Gout   . Hyperlipidemia   . Hypertension   . Myocardial infarction ([place]) 2001  . Peripheral vascular disease ([place])   . Pneumothorax, left 1950    Patient Active Problem List   Diagnosis Date Noted  . Acute on chronic combined systolic and diastolic CHF (congestive heart failure) ([place]) 05/23/2019  . Acute pain of left shoulder 09/08/2015  . CKD (chronic kidney disease), stage IV ([place]) 12/24/2014  . Bradycardia 12/02/2014  . TI (tricuspid incompetence) 10/21/2014  . DD (diverticular disease) 07/30/2014  . Essential (primary) hypertension 07/30/2014  . Tuberculosis 07/30/2014  . Hyperlipidemia 07/30/2014  .  Chronic systolic CHF (congestive heart failure), NYHA class 2 ([place]) 02/12/2014  . AF (paroxysmal atrial fibrillation) ([place]) 02/12/2014  . Paroxysmal A-fib ([place]) 02/12/2014  . Accumulation of fluid in tissues 07/24/2013  . Acid reflux 07/24/2013  . Gout 07/24/2013  . Diabetes mellitus, type 2 ([place]) 07/24/2013  . Type 2 diabetes mellitus with renal manifestations, controlled ([place]) 07/24/2013  . Peripheral blood vessel disorder ([place]) 07/19/2013  . PVD (peripheral vascular disease) ([place]) 07/19/2013  . Combined fat and carbohydrate induced hyperlipemia 05/21/2013  . Encounter for therapeutic drug monitoring 12/18/2012  . Chronic kidney disease (CKD), stage IV (severe) ([place]) 12/18/2012  . Arteriosclerosis of coronary artery 12/18/2012  . Decreased potassium in the blood 12/18/2012  . H/O coronary artery bypass surgery 12/18/2012  . H/O total knee replacement 12/18/2012  . Encounter for therapeutic drug level monitoring 12/18/2012    Past Surgical History:  Procedure Laterality Date  . CARDIAC CATHETERIZATION    . CARDIAC SURGERY     4 bypass  . CHOLECYSTECTOMY    . CORONARY ARTERY BYPASS GRAFT    . CYSTOSCOPY W/ RETROGRADES N/A 12/03/2014   Procedure: CYSTOSCOPY WITH ATTEMPT FOR RETROGRADE PYELOGRAM;  Surgeon: [name], MD;  Location: [facility] ORS;  Service: Urology;  Laterality: N/A;  . CYSTOSCOPY WITH LITHOLAPAXY N/A 12/03/2014   Procedure: CYSTOSCOPY WITH LITHOLAPAXY WITH HOLMIUM LASER ;  Surgeon: [name], MD;  Location: [facility] ORS;  Service: Urology;  Laterality: N/A;  . FEMUR SURGERY    .  GALLBLADDER SURGERY    . HERNIA REPAIR    . JOINT REPLACEMENT Right    Total Knee Replacement  . LUNG SURGERY Left   . REPLACEMENT TOTAL KNEE      Prior to Admission medications   Medication Sig Start Date End Date Taking? Authorizing Provider  allopurinol (ZYLOPRIM) 300 MG tablet Take 150 mg by mouth daily.  05/09/14   [provider]  aspirin EC 81 MG tablet Take 81  mg by mouth at bedtime.     [provider]  finasteride (PROSCAR) 5 MG tablet Take 1 tablet (5 mg total) by mouth daily. 08/13/18   Billey Co, MD  losartan (COZAAR) 25 MG tablet Take 25 mg by mouth daily. 05/10/19   [provider]  metoprolol succinate (TOPROL-XL) 25 MG 24 hr tablet Take 25 mg by mouth daily. 05/10/19   [provider]  Multiple Vitamin (MULTI-VITAMINS) TABS Take 1 tablet by mouth daily.     [provider]  Omega-3 Fatty Acids (FISH OIL) 1000 MG CAPS Take 1 capsule by mouth daily.    [provider]  pantoprazole (PROTONIX) 40 MG tablet Take 40 mg by mouth daily. 04/26/19   [provider]  simvastatin (ZOCOR) 40 MG tablet Take 40 mg by mouth daily at 6 PM.  07/07/14   [provider]  tamsulosin (FLOMAX) 0.4 MG CAPS capsule Take 1 capsule (0.4 mg total) by mouth daily. 08/13/18   Billey Co, MD  torsemide (DEMADEX) 20 MG tablet Take 2 tablets (40 mg total) by mouth daily. 05/28/19 06/27/19  Sidney Ace, MD  warfarin (COUMADIN) 1 MG tablet Take 1 mg by mouth daily.    [provider]  warfarin (COUMADIN) 3 MG tablet Take 3 mg by mouth daily at 6 PM.  05/02/14   [provider]  colchicine 0.6 MG tablet Take 0.6 mg by mouth as needed.   08/28/18  [provider]    Allergies Morphine and related  Family History  Problem Relation Age of Onset  . Lung cancer Mother   . Tuberculosis Brother     Social History Social History   Tobacco Use  . Smoking status: Former Smoker    Types: Pipe    Quit date: 07/30/2006    Years since quitting: 13.1  . Smokeless tobacco: Never Used  Vaping Use  . Vaping Use: Never used  Substance Use Topics  . Alcohol use: Yes    Alcohol/week: 14.0 standard drinks    Types: 14 Glasses of wine per week    Comment: one glass of wine daily  . Drug use: No      Review of Systems Constitutional: No fever/chills Eyes: No visual changes. ENT:  No sore throat. Cardiovascular: Denies chest pain. Respiratory: Denies shortness of breath. Gastrointestinal: No abdominal pain.  No nausea, no vomiting.  No diarrhea.  No constipation. Genitourinary: Negative for dysuria. Musculoskeletal: Negative for back pain. Leg swelling Skin: Negative for rash. Neurological: Negative for headaches, focal weakness or numbness. All other ROS negative ____________________________________________   PHYSICAL EXAM:  VITAL SIGNS: ED Triage Vitals  Enc Vitals Group     BP 09/05/19 1346 120/61     Pulse Rate 09/05/19 1346 76     Resp 09/05/19 1346 16     Temp 09/05/19 1346 (!) 97.5 F (36.4 C)     Temp Source 09/05/19 1346 Oral     SpO2 09/05/19 1346 95 %     Weight 09/05/19  1344 214 lb 15.2 oz (97.5 kg)     Height 09/05/19 1344 5\' 7"  (1.702 m)     Head Circumference --      Peak Flow --      Pain Score 09/05/19 1343 0     Pain Loc --      Pain Edu? --      Excl. in [place]? --     Constitutional: Alert and oriented. Well appearing and in no acute distress. Eyes: Conjunctivae are normal. EOMI. Head: Atraumatic. Nose: No congestion/rhinnorhea. Mouth/Throat: Mucous membranes are moist.   Neck: No stridor. Trachea Midline. FROM Cardiovascular: Normal rate, regular rhythm. Grossly normal heart sounds.  Good peripheral circulation. Respiratory: Normal respiratory effort.  No retractions. Lungs CTAB. Gastrointestinal: Soft and nontender. No distention. No abdominal bruits.  Musculoskeletal: 2+ edema bilaterally, good distal pulse. No warmth noted. No open wounds. Neurologic:  Normal speech and language. No gross focal neurologic deficits are appreciated.  Skin:  Skin is warm, dry and intact. No rash noted. Psychiatric: Mood and affect are normal. Speech and behavior are normal. GU: Deferred   ____________________________________________   LABS (all labs ordered are listed, but only abnormal results are displayed)  Labs Reviewed  BASIC  METABOLIC PANEL - Abnormal; Notable for the following components:      Result Value   Glucose, Bld 114 (*)    BUN 51 (*)    Creatinine, Ser 2.11 (*)    GFR calc non Af Amer 27 (*)    GFR calc Af Amer 31 (*)    All other components within normal limits  CBC - Abnormal; Notable for the following components:   RBC 3.36 (*)    Hemoglobin 10.9 (*)    HCT 32.2 (*)    All other components within normal limits  BRAIN NATRIURETIC PEPTIDE - Abnormal; Notable for the following components:   B Natriuretic Peptide 183.0 (*)    All other components within normal limits  HEPATIC FUNCTION PANEL   ____________________________________________   ED ECG REPORT I, Vanessa [place], the attending physician, personally viewed and interpreted this ECG.  A. fib rate of 74, no ST elevation, no T wave inversions, normal intervals ____________________________________________  RADIOLOGY Robert Bellow, personally viewed and evaluated these images (plain radiographs) as part of my medical decision making, as well as reviewing the written report by the radiologist.  ED MD interpretation: No pleural effusions or pulmonary edema  Official radiology report(s): DG Chest 2 View  Result Date: 09/05/2019 CLINICAL DATA:  Leg swelling. EXAM: CHEST - 2 VIEW COMPARISON:  Chest x-ray dated December 12, 2013. FINDINGS: Normal heart size status post CABG. Normal mediastinal contours. Atherosclerotic calcification of the aortic arch. Normal pulmonary vascularity. Unchanged bilateral calcified granulomas. No focal consolidation, pleural effusion, or pneumothorax. No acute osseous abnormality. IMPRESSION: No active cardiopulmonary disease. Electronically Signed   By: Titus Dubin M.D.   On: 09/05/2019 14:57    ____________________________________________   PROCEDURES  Procedure(s) performed (including Critical Care):  Procedures   ____________________________________________   INITIAL IMPRESSION / ASSESSMENT AND  PLAN / ED COURSE  Carlos [name] was evaluated in Emergency Department on 09/05/2019 for the symptoms described in the history of present illness. He was evaluated in the context of the global COVID-19 pandemic, which necessitated consideration that the patient might be at risk for infection with the SARS-CoV-2 virus that causes COVID-19. Institutional protocols and algorithms that pertain to the evaluation of patients at risk for COVID-19 are in a state  of rapid change based on information released by regulatory bodies including the CDC and federal and state organizations. These policies and algorithms were followed during the patient's care in the ED.    Patient is a well-appearing 86 year old who comes in from slowly increasing leg swelling over the past few months. Patient denies any shortness of breath. Will get chest x-ray to make sure no evidence of pulmonary edema but have low suspicion given his oxygen levels are 100%. Will get labs to evaluate for worsening kidney function, elevation of his BNP to suggest worsening heart failure. There are no signs of arterial occlusions on exam. No signs of cellulitis. No open wounds on his feet.  Labs around his baseline.  Kidney function is 2.11.  Hemoglobin is around baseline at 10.9.  No signs of liver failure.  BNP slightly elevated at 183  Unlikely blood clots given this is been going on for some time now and his INR is therapeutic.  Chest x-ray negative for pulmonary edema.  Most likely cardiorenal secondary to his CKD. Patient is followed closely by Dr. Holley Raring. I do not think he needs to be admitted at this time given there is no oxygen requirement. I think we can trial 3 days of going up on his torsemide, compression socks, elevating legs and he can follow-up with Dr. Holley Raring on Monday. If his kidney function is worsening or his swelling is not getting better he may need to come back in for a Lasix drip as was done last time. I also put in a referral for  heart failure clinic. Patient feels comfortable with this plan and understands that he needs to have close follow-up and that he may need to come back in if his symptoms are not getting better.   ____________________________________________   FINAL CLINICAL IMPRESSION(S) / ED DIAGNOSES   Final diagnoses:  Leg edema      MEDICATIONS GIVEN DURING THIS VISIT:  Medications  sodium chloride flush (NS) 0.9 % injection 3 mL (3 mLs Intravenous Not Given 09/05/19 1737)     ED Discharge Orders         Ordered    AMB referral to CHF clinic     Discontinue  Reprint     09/05/19 1929           Note:  This document was prepared using Dragon voice recognition software and may include unintentional dictation errors.   Vanessa [place], MD 09/05/19 [phone]

## 2019-09-09 NOTE — Progress Notes (Deleted)
   Patient ID: Carlos [name], male    DOB: May 24, 1926, 86 y.o.   MRN: [id number]  HPI  Carlos [name] is a 86 y/o male with a history of  Echo report from 05/24/19 reviewed and showed an EF of 60-65% along with mild Carlos.   Was in the ED 09/05/19 due to leg edema. CXR negative for pulmonary edema. BNP slightly elevated. Increased oral diuretic and he was released.    He presents today for his initial visit with a chief complaint of  Review of Systems    Physical Exam    Assessment & Plan:  1: Chronic heart failure with preserved ejection fraction without structural changes- - NYHA class - saw cardiology Carlos [name]) 07/10/19 - BNP 09/05/19 was 183.0  2: HTN- - BP - saw PCP (Carlos [name]) 08/05/19 - BMP 09/05/19 reviewed and showed sodium 141, potassium 3.9, creatinine 2.11 and GFR 27  3: DM- - A1c on 05/23/19 was 6.0%  4: CKD- - saw nephrology Carlos [name]) 08/29/19

## 2019-09-10 ENCOUNTER — Ambulatory Visit: Payer: Medicare Other | Admitting: Family

## 2019-09-10 ENCOUNTER — Inpatient Hospital Stay
Admission: EM | Admit: 2019-09-10 | Discharge: 2019-09-12 | DRG: 300 | Disposition: A | Discharge status: Home Health | Payer: Medicare Other | Attending: Internal Medicine | Admitting: Internal Medicine

## 2019-09-10 ENCOUNTER — Emergency Department: Payer: Medicare Other

## 2019-09-10 ENCOUNTER — Other Ambulatory Visit: Payer: Self-pay

## 2019-09-10 ENCOUNTER — Telehealth: Payer: Self-pay | Admitting: Family

## 2019-09-10 DIAGNOSIS — Z20822 Contact with and (suspected) exposure to covid-19: Secondary | ICD-10-CM | POA: Diagnosis present

## 2019-09-10 DIAGNOSIS — I2510 Atherosclerotic heart disease of native coronary artery without angina pectoris: Secondary | ICD-10-CM | POA: Diagnosis present

## 2019-09-10 DIAGNOSIS — Z951 Presence of aortocoronary bypass graft: Secondary | ICD-10-CM

## 2019-09-10 DIAGNOSIS — Z7901 Long term (current) use of anticoagulants: Secondary | ICD-10-CM

## 2019-09-10 DIAGNOSIS — E1151 Type 2 diabetes mellitus with diabetic peripheral angiopathy without gangrene: Secondary | ICD-10-CM | POA: Diagnosis present

## 2019-09-10 DIAGNOSIS — E1122 Type 2 diabetes mellitus with diabetic chronic kidney disease: Secondary | ICD-10-CM | POA: Diagnosis present

## 2019-09-10 DIAGNOSIS — T502X5A Adverse effect of carbonic-anhydrase inhibitors, benzothiadiazides and other diuretics, initial encounter: Secondary | ICD-10-CM | POA: Diagnosis not present

## 2019-09-10 DIAGNOSIS — K219 Gastro-esophageal reflux disease without esophagitis: Secondary | ICD-10-CM | POA: Diagnosis present

## 2019-09-10 DIAGNOSIS — I252 Old myocardial infarction: Secondary | ICD-10-CM

## 2019-09-10 DIAGNOSIS — E1129 Type 2 diabetes mellitus with other diabetic kidney complication: Secondary | ICD-10-CM | POA: Diagnosis present

## 2019-09-10 DIAGNOSIS — I480 Paroxysmal atrial fibrillation: Secondary | ICD-10-CM | POA: Diagnosis not present

## 2019-09-10 DIAGNOSIS — I5032 Chronic diastolic (congestive) heart failure: Secondary | ICD-10-CM | POA: Diagnosis present

## 2019-09-10 DIAGNOSIS — Z885 Allergy status to narcotic agent status: Secondary | ICD-10-CM

## 2019-09-10 DIAGNOSIS — I959 Hypotension, unspecified: Secondary | ICD-10-CM | POA: Diagnosis not present

## 2019-09-10 DIAGNOSIS — E785 Hyperlipidemia, unspecified: Secondary | ICD-10-CM | POA: Diagnosis present

## 2019-09-10 DIAGNOSIS — M109 Gout, unspecified: Secondary | ICD-10-CM | POA: Diagnosis present

## 2019-09-10 DIAGNOSIS — D638 Anemia in other chronic diseases classified elsewhere: Secondary | ICD-10-CM

## 2019-09-10 DIAGNOSIS — Z801 Family history of malignant neoplasm of trachea, bronchus and lung: Secondary | ICD-10-CM

## 2019-09-10 DIAGNOSIS — R609 Edema, unspecified: Secondary | ICD-10-CM

## 2019-09-10 DIAGNOSIS — E119 Type 2 diabetes mellitus without complications: Secondary | ICD-10-CM

## 2019-09-10 DIAGNOSIS — N189 Chronic kidney disease, unspecified: Secondary | ICD-10-CM

## 2019-09-10 DIAGNOSIS — I10 Essential (primary) hypertension: Secondary | ICD-10-CM | POA: Diagnosis present

## 2019-09-10 DIAGNOSIS — I130 Hypertensive heart and chronic kidney disease with heart failure and stage 1 through stage 4 chronic kidney disease, or unspecified chronic kidney disease: Secondary | ICD-10-CM | POA: Diagnosis present

## 2019-09-10 DIAGNOSIS — D631 Anemia in chronic kidney disease: Secondary | ICD-10-CM | POA: Diagnosis present

## 2019-09-10 DIAGNOSIS — D509 Iron deficiency anemia, unspecified: Secondary | ICD-10-CM | POA: Diagnosis present

## 2019-09-10 DIAGNOSIS — R600 Localized edema: Secondary | ICD-10-CM | POA: Diagnosis not present

## 2019-09-10 DIAGNOSIS — N184 Chronic kidney disease, stage 4 (severe): Secondary | ICD-10-CM | POA: Diagnosis present

## 2019-09-10 DIAGNOSIS — N400 Enlarged prostate without lower urinary tract symptoms: Secondary | ICD-10-CM | POA: Diagnosis present

## 2019-09-10 DIAGNOSIS — Z7982 Long term (current) use of aspirin: Secondary | ICD-10-CM

## 2019-09-10 DIAGNOSIS — Z87891 Personal history of nicotine dependence: Secondary | ICD-10-CM

## 2019-09-10 DIAGNOSIS — Z96651 Presence of right artificial knee joint: Secondary | ICD-10-CM | POA: Diagnosis present

## 2019-09-10 DIAGNOSIS — I872 Venous insufficiency (chronic) (peripheral): Secondary | ICD-10-CM | POA: Diagnosis not present

## 2019-09-10 DIAGNOSIS — Z79899 Other long term (current) drug therapy: Secondary | ICD-10-CM

## 2019-09-10 DIAGNOSIS — Z831 Family history of other infectious and parasitic diseases: Secondary | ICD-10-CM

## 2019-09-10 LAB — CBC
HCT: 32.6 % — ABNORMAL LOW (ref 39.0–52.0)
Hemoglobin: 10.6 g/dL — ABNORMAL LOW (ref 13.0–17.0)
MCH: 31.5 pg (ref 26.0–34.0)
MCHC: 32.5 g/dL (ref 30.0–36.0)
MCV: 97 fL (ref 80.0–100.0)
Platelets: 192 10*3/uL (ref 150–400)
RBC: 3.36 MIL/uL — ABNORMAL LOW (ref 4.22–5.81)
RDW: 14.3 % (ref 11.5–15.5)
WBC: 4.7 10*3/uL (ref 4.0–10.5)
nRBC: 0 % (ref 0.0–0.2)

## 2019-09-10 LAB — SARS CORONAVIRUS 2 BY RT PCR (HOSPITAL ORDER, PERFORMED IN ~~LOC~~ HOSPITAL LAB): SARS Coronavirus 2: NEGATIVE

## 2019-09-10 LAB — BASIC METABOLIC PANEL
Anion gap: 10 (ref 5–15)
BUN: 54 mg/dL — ABNORMAL HIGH (ref 8–23)
CO2: 27 mmol/L (ref 22–32)
Calcium: 9.1 mg/dL (ref 8.9–10.3)
Chloride: 103 mmol/L (ref 98–111)
Creatinine, Ser: 2.14 mg/dL — ABNORMAL HIGH (ref 0.61–1.24)
GFR calc Af Amer: 30 mL/min — ABNORMAL LOW (ref >60)
GFR calc non Af Amer: 26 mL/min — ABNORMAL LOW (ref >60)
Glucose, Bld: 127 mg/dL — ABNORMAL HIGH (ref 70–99)
Potassium: 3.8 mmol/L (ref 3.5–5.1)
Sodium: 140 mmol/L (ref 135–145)

## 2019-09-10 LAB — PROTIME-INR
INR: 2.5 — ABNORMAL HIGH (ref 0.8–1.2)
Prothrombin Time: 26 seconds — ABNORMAL HIGH (ref 11.4–15.2)

## 2019-09-10 MED ORDER — WARFARIN SODIUM 3 MG PO TABS
3.0000 mg | ORAL_TABLET | Freq: Every day | ORAL | Status: DC
  Administered 2019-09-11 (×2): 3 mg via ORAL
  Filled 2019-09-10 (×4): qty 1

## 2019-09-10 MED ORDER — FUROSEMIDE 10 MG/ML IJ SOLN
5.0000 mg/h | INTRAVENOUS | Status: DC
  Administered 2019-09-10: 5 mg/h via INTRAVENOUS
  Filled 2019-09-10: qty 25

## 2019-09-10 MED ORDER — SODIUM CHLORIDE 0.9% FLUSH
3.0000 mL | Freq: Two times a day (BID) | INTRAVENOUS | Status: DC
  Administered 2019-09-11 – 2019-09-12 (×3): 3 mL via INTRAVENOUS

## 2019-09-10 MED ORDER — METOPROLOL SUCCINATE ER 25 MG PO TB24
25.0000 mg | ORAL_TABLET | Freq: Every day | ORAL | Status: DC
  Administered 2019-09-12: 25 mg via ORAL
  Filled 2019-09-10 (×2): qty 1

## 2019-09-10 MED ORDER — SODIUM CHLORIDE 0.9% FLUSH: 3.0000 mL | Freq: Once | INTRAVENOUS | Status: DC

## 2019-09-10 MED ORDER — ACETAMINOPHEN 500 MG PO TABS: 500.0000 mg | ORAL_TABLET | ORAL | Status: DC | PRN

## 2019-09-10 MED ORDER — ADULT MULTIVITAMIN W/MINERALS CH
1.0000 | ORAL_TABLET | Freq: Every day | ORAL | Status: DC
  Administered 2019-09-11 – 2019-09-12 (×2): 1 via ORAL
  Filled 2019-09-10 (×2): qty 1

## 2019-09-10 MED ORDER — SIMVASTATIN 20 MG PO TABS
40.0000 mg | ORAL_TABLET | Freq: Every day | ORAL | Status: DC
  Administered 2019-09-11: 40 mg via ORAL
  Filled 2019-09-10: qty 2

## 2019-09-10 MED ORDER — FINASTERIDE 5 MG PO TABS: 5.0000 mg | ORAL_TABLET | Freq: Every day | ORAL | Status: DC

## 2019-09-10 MED ORDER — SODIUM CHLORIDE 0.9% FLUSH: 3.0000 mL | INTRAVENOUS | Status: DC | PRN

## 2019-09-10 MED ORDER — LORATADINE 10 MG PO TABS: 10.0000 mg | ORAL_TABLET | Freq: Every day | ORAL | Status: DC | PRN

## 2019-09-10 MED ORDER — ALLOPURINOL 300 MG PO TABS: 150.0000 mg | ORAL_TABLET | Freq: Every day | ORAL | Status: DC

## 2019-09-10 MED ORDER — ONDANSETRON HCL 4 MG/2ML IJ SOLN: 4.0000 mg | Freq: Four times a day (QID) | INTRAMUSCULAR | Status: DC | PRN

## 2019-09-10 MED ORDER — WARFARIN SODIUM 1 MG PO TABS: 1.0000 mg | ORAL_TABLET | Freq: Every day | ORAL | Status: DC

## 2019-09-10 MED ORDER — ONDANSETRON HCL 4 MG PO TABS: 4.0000 mg | ORAL_TABLET | Freq: Four times a day (QID) | ORAL | Status: DC | PRN

## 2019-09-10 MED ORDER — PANTOPRAZOLE SODIUM 40 MG PO TBEC
40.0000 mg | DELAYED_RELEASE_TABLET | Freq: Every day | ORAL | Status: DC
  Administered 2019-09-11 – 2019-09-12 (×2): 40 mg via ORAL
  Filled 2019-09-10 (×2): qty 1

## 2019-09-10 MED ORDER — OMEGA-3-ACID ETHYL ESTERS 1 G PO CAPS
1.0000 g | ORAL_CAPSULE | Freq: Every day | ORAL | Status: DC
  Administered 2019-09-11 – 2019-09-12 (×2): 1 g via ORAL
  Filled 2019-09-10 (×2): qty 1

## 2019-09-10 MED ORDER — TAMSULOSIN HCL 0.4 MG PO CAPS: 0.4000 mg | ORAL_CAPSULE | Freq: Every day | ORAL | Status: DC

## 2019-09-10 MED ORDER — LOSARTAN POTASSIUM 25 MG PO TABS
25.0000 mg | ORAL_TABLET | Freq: Every day | ORAL | Status: DC
  Administered 2019-09-11 – 2019-09-12 (×2): 25 mg via ORAL
  Filled 2019-09-10 (×2): qty 1

## 2019-09-10 MED ORDER — SODIUM CHLORIDE 0.9 % IV SOLN: 250.0000 mL | INTRAVENOUS | Status: DC | PRN

## 2019-09-10 MED ORDER — ASPIRIN EC 81 MG PO TBEC
81.0000 mg | DELAYED_RELEASE_TABLET | Freq: Every day | ORAL | Status: DC
  Administered 2019-09-10 – 2019-09-11 (×2): 81 mg via ORAL
  Filled 2019-09-10 (×2): qty 1

## 2019-09-10 NOTE — Telephone Encounter (Signed)
Patient did not show for his Heart Failure Clinic appointment on 09/10/19. Will attempt to reschedule.  °

## 2019-09-10 NOTE — H&P (Signed)
History and Physical    Carlos [name] ACZ:[id number] DOB: 1927-01-26 DOA: 09/10/2019  PCP: [name], MD   Patient coming from: Home  I have personally briefly reviewed patient's old medical records in [facility]  Chief Complaint: Bilateral lower extremity swelling  HPI: Carlos [name] is a 86 y.o. male with medical history significant for chronic atrial fibrillation, diabetes mellitus with complications of stage IV chronic kidney disease, coronary artery disease, CHF who was sent to the emergency room by his nephrologist for refractory lower extremity swelling.  Patient has had progressively worsening bilateral lower extremity swelling for about 3 weeks and was seen by his cardiologist who doubled the dose of his diuretics without any improvement in the symptoms.  He went to see the nephrologist who referred him to the ER and recommended the patient be placed on IV Lasix to improve diuresis. Patient denies having any shortness of breath, no chest pain, no nausea, no abdominal pain, no vomiting, no dizziness, no lightheadedness, no changes in his bowel habits. He denies having any dietary indiscretion His labs reveal a serum creatinine of 2.14 which is his baseline, BUN of 54 and hemoglobin of 10.6. Chest x-ray reviewed by me shows no evidence of infiltrate or effusion Twelve-lead EKG reviewed by me shows normal sinus rhythm with LVH   ED Course: Patient is a 86 year old Caucasian male who was sent to the emergency room for evaluation of bilateral lower extremity swelling without responding to oral diuretic therapy.  Patient's nephrologist has recommended he be treated with Lasix drip.  Patient will be admitted to the hospital for further evaluation.  Review of Systems: As per HPI otherwise 10 point review of systems negative.    Past Medical History:  Diagnosis Date  . Arthritis   . Atrial fibrillation ([place])   . CHF (congestive heart failure) ([place])   . Chronic kidney disease     kidney stones  . Coronary artery disease   . Diabetes mellitus without complication ([place])   . Diverticulosis   . GERD (gastroesophageal reflux disease)   . Gout   . Hyperlipidemia   . Hypertension   . Myocardial infarction ([place]) 2001  . Peripheral vascular disease ([place])   . Pneumothorax, left 1950    Past Surgical History:  Procedure Laterality Date  . CARDIAC CATHETERIZATION    . CARDIAC SURGERY     4 bypass  . CHOLECYSTECTOMY    . CORONARY ARTERY BYPASS GRAFT    . CYSTOSCOPY W/ RETROGRADES N/A 12/03/2014   Procedure: CYSTOSCOPY WITH ATTEMPT FOR RETROGRADE PYELOGRAM;  Surgeon: [name], MD;  Location: [facility] ORS;  Service: Urology;  Laterality: N/A;  . CYSTOSCOPY WITH LITHOLAPAXY N/A 12/03/2014   Procedure: CYSTOSCOPY WITH LITHOLAPAXY WITH HOLMIUM LASER ;  Surgeon: [name], MD;  Location: [facility] ORS;  Service: Urology;  Laterality: N/A;  . FEMUR SURGERY    . GALLBLADDER SURGERY    . HERNIA REPAIR    . JOINT REPLACEMENT Right    Total Knee Replacement  . LUNG SURGERY Left   . REPLACEMENT TOTAL KNEE       reports that he quit smoking about 13 years ago. His smoking use included pipe. He has never used smokeless tobacco. He reports current alcohol use of about 14.0 standard drinks of alcohol per week. He reports that he does not use drugs.  Allergies  Allergen Reactions  . Morphine And Related Other (See Comments)    "nightmares"    Family History  Problem  Relation Age of Onset  . Lung cancer Mother   . Tuberculosis Brother      Prior to Admission medications   Medication Sig Start Date End Date Taking? Authorizing Provider  acetaminophen (TYLENOL) 500 MG tablet Take 500 mg by mouth as needed for mild pain.    [provider]  allopurinol (ZYLOPRIM) 300 MG tablet Take 150 mg by mouth daily.  05/09/14   [provider]  aspirin EC 81 MG tablet Take 81 mg by mouth at bedtime.     [provider]  finasteride (PROSCAR) 5 MG  tablet Take 1 tablet (5 mg total) by mouth daily. 08/13/18   Billey Co, MD  loratadine (CLARITIN) 10 MG tablet Take 10 mg by mouth daily as needed. 07/16/19   [provider]  losartan (COZAAR) 25 MG tablet Take 25 mg by mouth daily. 05/10/19   [provider]  metoprolol succinate (TOPROL-XL) 25 MG 24 hr tablet Take 25 mg by mouth daily. 05/10/19   [provider]  Multiple Vitamin (MULTI-VITAMINS) TABS Take 1 tablet by mouth daily.     [provider]  Omega-3 Fatty Acids (FISH OIL) 1000 MG CAPS Take 1 capsule by mouth daily.    [provider]  pantoprazole (PROTONIX) 40 MG tablet Take 40 mg by mouth daily. 04/26/19   [provider]  simvastatin (ZOCOR) 40 MG tablet Take 40 mg by mouth daily at 6 PM.  07/07/14   [provider]  tamsulosin (FLOMAX) 0.4 MG CAPS capsule Take 1 capsule (0.4 mg total) by mouth daily. 08/13/18   Billey Co, MD  torsemide (DEMADEX) 20 MG tablet Take 2 tablets (40 mg total) by mouth daily. 05/28/19 06/27/19  Sidney Ace, MD  warfarin (COUMADIN) 1 MG tablet Take 1 mg by mouth daily.    [provider]  warfarin (COUMADIN) 3 MG tablet Take 3 mg by mouth daily at 6 PM.  05/02/14   [provider]  colchicine 0.6 MG tablet Take 0.6 mg by mouth as needed.   08/28/18  [provider]    Physical Exam: Vitals:   09/10/19 1314 09/10/19 1315 09/10/19 1400 09/10/19 1430  BP:  113/64 116/60 (!) 130/57  Pulse:  77 72 65  Resp:  20    Temp:  98 F (36.7 C)    TempSrc:  Oral    SpO2:  94% 94% 97%  Weight: 94.3 kg     Height: 5\' 7"  (1.702 m)        Vitals:   09/10/19 1314 09/10/19 1315 09/10/19 1400 09/10/19 1430  BP:  113/64 116/60 (!) 130/57  Pulse:  77 72 65  Resp:  20    Temp:  98 F (36.7 C)    TempSrc:  Oral    SpO2:  94% 94% 97%  Weight: 94.3 kg     Height: 5\' 7"  (1.702 m)       Constitutional: NAD, alert and oriented x 3 Eyes: PERRL, lids and  conjunctivae pallor ENMT: Mucous membranes are moist.  Neck: normal, supple, no masses, no thyromegaly Respiratory: clear to auscultation bilaterally, no wheezing, no crackles. Normal respiratory effort. No accessory muscle use.  Cardiovascular: Regular rate and rhythm, no murmurs / rubs / gallops. 3+ extremity edema. 2+ pedal pulses. No carotid bruits.  Abdomen: no tenderness, no masses palpated. No hepatosplenomegaly. Bowel sounds positive.  Musculoskeletal: no clubbing / cyanosis. No joint deformity upper and lower extremities.  Skin: no rashes, lesions,  ulcers.  Neurologic: No gross focal neurologic deficit. Psychiatric: Normal mood and affect.   Labs on Admission: I have personally reviewed following labs and imaging studies  CBC: Recent Labs  Lab 09/05/19 1356 09/10/19 1317  WBC 5.2 4.7  HGB 10.9* 10.6*  HCT 32.2* 32.6*  MCV 95.8 97.0  PLT 182 161   Basic Metabolic Panel: Recent Labs  Lab 09/05/19 1356 09/10/19 1317  NA 141 140  K 3.9 3.8  CL 101 103  CO2 31 27  GLUCOSE 114* 127*  BUN 51* 54*  CREATININE 2.11* 2.14*  CALCIUM 9.2 9.1   GFR: Estimated Creatinine Clearance: 24.6 mL/min (A) (by C-G formula based on SCr of 2.14 mg/dL (H)). Liver Function Tests: Recent Labs  Lab 09/05/19 1635  AST 29  ALT 18  ALKPHOS 54  BILITOT 0.9  PROT 6.7  ALBUMIN 3.8   No results for input(s): LIPASE, AMYLASE in the last 168 hours. No results for input(s): AMMONIA in the last 168 hours. Coagulation Profile: Recent Labs  Lab 09/05/19 1356  INR 3.2*   Cardiac Enzymes: No results for input(s): CKTOTAL, CKMB, CKMBINDEX, TROPONINI in the last 168 hours. BNP (last 3 results) No results for input(s): PROBNP in the last 8760 hours. HbA1C: No results for input(s): HGBA1C in the last 72 hours. CBG: No results for input(s): GLUCAP in the last 168 hours. Lipid Profile: No results for input(s): CHOL, HDL, LDLCALC, TRIG, CHOLHDL, LDLDIRECT in the last 72 hours. Thyroid  Function Tests: No results for input(s): TSH, T4TOTAL, FREET4, T3FREE, THYROIDAB in the last 72 hours. Anemia Panel: No results for input(s): VITAMINB12, FOLATE, FERRITIN, TIBC, IRON, RETICCTPCT in the last 72 hours. Urine analysis:    Component Value Date/Time   COLORURINE Yellow 12/12/2013 1512   APPEARANCEUR Clear 03/26/2015 1132   LABSPEC 1.006 12/12/2013 1512   PHURINE 5.0 12/12/2013 1512   GLUCOSEU Negative 03/26/2015 1132   GLUCOSEU Negative 12/12/2013 1512   HGBUR Negative 12/12/2013 1512   BILIRUBINUR Negative 03/26/2015 1132   BILIRUBINUR Negative 12/12/2013 1512   KETONESUR Negative 12/12/2013 1512   PROTEINUR Negative 03/26/2015 1132   PROTEINUR Negative 12/12/2013 1512   NITRITE Negative 03/26/2015 1132   NITRITE Negative 12/12/2013 1512   LEUKOCYTESUR Trace (A) 03/26/2015 1132   LEUKOCYTESUR Trace 12/12/2013 1512    Radiological Exams on Admission: DG Chest 2 View  Result Date: 09/10/2019 CLINICAL DATA:  Lower extremity edema EXAM: CHEST - 2 VIEW COMPARISON:  September 05, 2019 and December 12, 2013 FINDINGS: There are calcified granulomas bilaterally. There is apparent pleural calcification in the left upper thorax, unchanged. There is no frank edema or airspace opacity. Heart size is upper normal with pulmonary vascularity normal. Patient is status post coronary artery bypass grafting. There is aortic atherosclerosis. No adenopathy. No bone lesions. IMPRESSION: Calcified granulomas. Areas of pleural plaque. Suspect previous asbestos exposure. No edema or airspace opacity. Heart upper normal in size. Status post coronary artery bypass grafting. Aortic Atherosclerosis (ICD10-I70.0). Electronically Signed   By: Lowella Grip III M.D.   On: 09/10/2019 13:39    EKG: Independently reviewed.  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 Active Problems:   Chronic kidney disease (CKD), stage IV  (severe) (HCC)   Essential (primary) hypertension   AF (paroxysmal atrial fibrillation) (HCC)   Diabetes mellitus, type 2 (HCC)   Type 2 diabetes mellitus with renal manifestations, controlled (HCC)   Anemia of chronic disease  Edema of both lower legs due to peripheral venous insufficiency Refractory to oral diuretic therapy We will start patient on IV Lasix Keep lower extremities elevated May benefit from compression socks Maintain low-sodium diet   Type 2 diabetes mellitus with complications of stage IV chronic kidney disease Maintain consistent carbohydrate diet Glycemic control with sliding scale coverage with insulin We will request nephrology consult for stage IV chronic kidney disease   Anemia of chronic kidney disease H&H is stable   Paroxysmal atrial fibrillation Continue metoprolol for rate control Patient is on Coumadin as primary prophylaxis for an acute stroke Check daily PT/INR   Hypertension Blood pressure is stable on metoprolol and losartan   BPH Continue tamsulosin finasteride   History of coronary artery disease Continue aspirin, statins and beta-blockers  DVT prophylaxis: Patient is on Coumadin Code Status: Full code Family Communication: Greater than 50% of time was spent discussing plan of care with patient at the bedside.  All questions and concerns have been addressed.  He names his daughter Manuela Schwartz as his healthcare power of attorney. Disposition Plan: Back to previous home environment Consults called: Nephrology    Collier Bullock MD [facility]     09/10/2019, 4:20 PM

## 2019-09-10 NOTE — ED Triage Notes (Signed)
First nurse note- pt here for swelling BLE from nephrology office.  Failed oral diuretics per office.

## 2019-09-10 NOTE — ED Notes (Signed)
Pt to room via wheelchair. Pt c/o swelling in legs for years. Pt states he was here last week and was told to double his lasix dose for 3 days and pt says it did not help. Pt in NAD, A&Ox4. Pt has mild pitting to feet and ankles.

## 2019-09-10 NOTE — ED Provider Notes (Signed)
[facility] Emergency Department Provider Note   ____________________________________________    I have reviewed the triage vital signs and the nursing notes.   HISTORY  Chief Complaint Leg Swelling     HPI Carlos [name] is a 86 y.o. male with history of CHF, atrial fibrillation, chronic kidney disease, coronary artery disease, diabetes who presents with significantly increased lower extremity swelling.  Patient reports typically his feet are mildly swollen but overall edema is well controlled with diuretics.  Recently cardiologist has doubled his diuretics without improvement.  Saw his nephrologist today who referred him to the emergency department for admission for IV Lasix drip given worsening edema.  Patient reports similar episode required in the past, stay in the hospital for several days with good response.  Confirmed via review of records  Past Medical History:  Diagnosis Date  . Arthritis   . Atrial fibrillation ([place])   . CHF (congestive heart failure) ([place])   . Chronic kidney disease    kidney stones  . Coronary artery disease   . Diabetes mellitus without complication ([place])   . Diverticulosis   . GERD (gastroesophageal reflux disease)   . Gout   . Hyperlipidemia   . Hypertension   . Myocardial infarction ([place]) 2001  . Peripheral vascular disease ([place])   . Pneumothorax, left 1950    Patient Active Problem List   Diagnosis Date Noted  . Acute on chronic combined systolic and diastolic CHF (congestive heart failure) ([place]) 05/23/2019  . Acute pain of left shoulder 09/08/2015  . CKD (chronic kidney disease), stage IV ([place]) 12/24/2014  . Bradycardia 12/02/2014  . TI (tricuspid incompetence) 10/21/2014  . DD (diverticular disease) 07/30/2014  . Essential (primary) hypertension 07/30/2014  . Tuberculosis 07/30/2014  . Hyperlipidemia 07/30/2014  . Chronic systolic CHF (congestive heart failure), NYHA class 2 ([place]) 02/12/2014  . AF  (paroxysmal atrial fibrillation) ([place]) 02/12/2014  . Paroxysmal A-fib ([place]) 02/12/2014  . Accumulation of fluid in tissues 07/24/2013  . Acid reflux 07/24/2013  . Gout 07/24/2013  . Diabetes mellitus, type 2 ([place]) 07/24/2013  . Type 2 diabetes mellitus with renal manifestations, controlled ([place]) 07/24/2013  . Peripheral blood vessel disorder ([place]) 07/19/2013  . PVD (peripheral vascular disease) ([place]) 07/19/2013  . Combined fat and carbohydrate induced hyperlipemia 05/21/2013  . Encounter for therapeutic drug monitoring 12/18/2012  . Chronic kidney disease (CKD), stage IV (severe) ([place]) 12/18/2012  . Arteriosclerosis of coronary artery 12/18/2012  . Decreased potassium in the blood 12/18/2012  . H/O coronary artery bypass surgery 12/18/2012  . H/O total knee replacement 12/18/2012  . Encounter for therapeutic drug level monitoring 12/18/2012    Past Surgical History:  Procedure Laterality Date  . CARDIAC CATHETERIZATION    . CARDIAC SURGERY     4 bypass  . CHOLECYSTECTOMY    . CORONARY ARTERY BYPASS GRAFT    . CYSTOSCOPY W/ RETROGRADES N/A 12/03/2014   Procedure: CYSTOSCOPY WITH ATTEMPT FOR RETROGRADE PYELOGRAM;  Surgeon: [name], MD;  Location: [facility] ORS;  Service: Urology;  Laterality: N/A;  . CYSTOSCOPY WITH LITHOLAPAXY N/A 12/03/2014   Procedure: CYSTOSCOPY WITH LITHOLAPAXY WITH HOLMIUM LASER ;  Surgeon: [name], MD;  Location: [facility] ORS;  Service: Urology;  Laterality: N/A;  . FEMUR SURGERY    . GALLBLADDER SURGERY    . HERNIA REPAIR    . JOINT REPLACEMENT Right    Total Knee Replacement  . LUNG SURGERY Left   . REPLACEMENT TOTAL KNEE  Prior to Admission medications   Medication Sig Start Date End Date Taking? Authorizing Provider  allopurinol (ZYLOPRIM) 300 MG tablet Take 150 mg by mouth daily.  05/09/14   [provider]  aspirin EC 81 MG tablet Take 81 mg by mouth at bedtime.     [provider]  finasteride (PROSCAR) 5 MG  tablet Take 1 tablet (5 mg total) by mouth daily. 08/13/18   Billey Co, MD  losartan (COZAAR) 25 MG tablet Take 25 mg by mouth daily. 05/10/19   [provider]  metoprolol succinate (TOPROL-XL) 25 MG 24 hr tablet Take 25 mg by mouth daily. 05/10/19   [provider]  Multiple Vitamin (MULTI-VITAMINS) TABS Take 1 tablet by mouth daily.     [provider]  Omega-3 Fatty Acids (FISH OIL) 1000 MG CAPS Take 1 capsule by mouth daily.    [provider]  pantoprazole (PROTONIX) 40 MG tablet Take 40 mg by mouth daily. 04/26/19   [provider]  simvastatin (ZOCOR) 40 MG tablet Take 40 mg by mouth daily at 6 PM.  07/07/14   [provider]  tamsulosin (FLOMAX) 0.4 MG CAPS capsule Take 1 capsule (0.4 mg total) by mouth daily. 08/13/18   Billey Co, MD  torsemide (DEMADEX) 20 MG tablet Take 2 tablets (40 mg total) by mouth daily. 05/28/19 06/27/19  Sidney Ace, MD  warfarin (COUMADIN) 1 MG tablet Take 1 mg by mouth daily.    [provider]  warfarin (COUMADIN) 3 MG tablet Take 3 mg by mouth daily at 6 PM.  05/02/14   [provider]  colchicine 0.6 MG tablet Take 0.6 mg by mouth as needed.   08/28/18  [provider]     Allergies Morphine and related  Family History  Problem Relation Age of Onset  . Lung cancer Mother   . Tuberculosis Brother     Social History Social History   Tobacco Use  . Smoking status: Former Smoker    Types: Pipe    Quit date: 07/30/2006    Years since quitting: 13.1  . Smokeless tobacco: Never Used  Vaping Use  . Vaping Use: Never used  Substance Use Topics  . Alcohol use: Yes    Alcohol/week: 14.0 standard drinks    Types: 14 Glasses of wine per week    Comment: one glass of wine daily  . Drug use: No    Review of Systems  Constitutional: No fever/chills Eyes: No visual changes.  ENT: No sore throat. Cardiovascular: Denies chest pain. Respiratory: No shortness of  breath Gastrointestinal: No abdominal pain.    Genitourinary: Negative for dysuria. Musculoskeletal: As above Skin: Mild weeping from left leg Neurological: Negative for headaches or weakness   ____________________________________________   PHYSICAL EXAM:  VITAL SIGNS: ED Triage Vitals  Enc Vitals Group     BP 09/10/19 1315 113/64     Pulse Rate 09/10/19 1315 77     Resp 09/10/19 1315 20     Temp 09/10/19 1315 98 F (36.7 C)     Temp Source 09/10/19 1315 Oral     SpO2 09/10/19 1315 94 %     Weight 09/10/19 1314 94.3 kg (208 lb)     Height 09/10/19 1314 1.702 m (5\' 7" )     Head Circumference --      Peak Flow --      Pain Score 09/10/19 1314 0     Pain Loc --  Pain Edu? --      Excl. in [place]? --     Constitutional: Alert and oriented.  Pleasant  Nose: No congestion/rhinnorhea. Mouth/Throat: Mucous membranes are moist.   Neck:  Painless ROM Cardiovascular: Normal rate, . Grossly normal heart sounds.  Good peripheral circulation. Respiratory: Normal respiratory effort.  No retractions. Lungs CTAB. Gastrointestinal: Soft and nontender. No distention.  No CVA tenderness.  Musculoskeletal: Bilateral lower extremity edema, 2+ to just below the knee, no evidence of infection, warm and well perfused Neurologic:  Normal speech and language. No gross focal neurologic deficits are appreciated.  Skin:  Skin is warm, dry and intact. No rash noted. Psychiatric: Mood and affect are normal. Speech and behavior are normal.  ____________________________________________   LABS (all labs ordered are listed, but only abnormal results are displayed)  Labs Reviewed  BASIC METABOLIC PANEL - Abnormal; Notable for the following components:      Result Value   Glucose, Bld 127 (*)    BUN 54 (*)    Creatinine, Ser 2.14 (*)    GFR calc non Af Amer 26 (*)    GFR calc Af Amer 30 (*)    All other components within normal limits  CBC - Abnormal; Notable for the following components:    RBC 3.36 (*)    Hemoglobin 10.6 (*)    HCT 32.6 (*)    All other components within normal limits  SARS CORONAVIRUS 2 BY RT PCR (HOSPITAL ORDER, [facility])   ____________________________________________  EKG  ED ECG REPORT I, Lavonia Drafts, the attending physician, personally viewed and interpreted this ECG.  Date: 09/10/2019  Rhythm: normal sinus rhythm QRS Axis: Left axis deviation Intervals: normal ST/T Wave abnormalities: Nonspecific changes Narrative Interpretation: no evidence of acute ischemia  ____________________________________________  RADIOLOGY  Chest x-ray reviewed by me, no significant change from prior.  Calcified granulomas per radiology ____________________________________________   PROCEDURES  Procedure(s) performed: No  Procedures   Critical Care performed: No ____________________________________________   INITIAL IMPRESSION / ASSESSMENT AND PLAN / ED COURSE  Pertinent labs & imaging results that were available during my care of the patient were reviewed by me and considered in my medical decision making (see chart for details).  Patient presents with lower extremity edema resistant to increased p.o. diuretic.  Referred in by nephrology for evaluation.  Lab work notable for creatinine of 2.14 however this seems in line with prior levels, he does have chronic kidney disease.  Chest x-ray without evidence of edema, no shortness of breath.  Discussed with Dr. Holley Raring of nephrology who recommends Lasix drip at 5 mg/h  I have consulted hospitalist for admission    ____________________________________________   FINAL CLINICAL IMPRESSION(S) / ED DIAGNOSES  Final diagnoses:  Bilateral lower extremity edema  Chronic kidney disease, unspecified CKD stage        Note:  This document was prepared using Dragon voice recognition software and may include unintentional dictation errors.   Lavonia Drafts, MD 09/10/19  1422

## 2019-09-10 NOTE — ED Notes (Signed)
Pt given dinner tray at this time. Pt's food warmed up by this RN, no further needs noted.

## 2019-09-10 NOTE — ED Triage Notes (Signed)
Pt presents to ED via POV with BLE edema. Pt states Dr. Holley Raring (nephrologist) sent patient to ED for admission due to lower extremity edema. Pt states takes diuretic, has not missed doses, states was seen several days ago and cardiology doubled diuretic dose with no change.   Pt denies CP/SOB at this time.

## 2019-09-11 ENCOUNTER — Encounter: Payer: Self-pay | Admitting: Internal Medicine

## 2019-09-11 DIAGNOSIS — K219 Gastro-esophageal reflux disease without esophagitis: Secondary | ICD-10-CM | POA: Diagnosis present

## 2019-09-11 DIAGNOSIS — Z951 Presence of aortocoronary bypass graft: Secondary | ICD-10-CM | POA: Diagnosis not present

## 2019-09-11 DIAGNOSIS — I5032 Chronic diastolic (congestive) heart failure: Secondary | ICD-10-CM | POA: Diagnosis present

## 2019-09-11 DIAGNOSIS — Z801 Family history of malignant neoplasm of trachea, bronchus and lung: Secondary | ICD-10-CM | POA: Diagnosis not present

## 2019-09-11 DIAGNOSIS — Z96651 Presence of right artificial knee joint: Secondary | ICD-10-CM | POA: Diagnosis present

## 2019-09-11 DIAGNOSIS — E785 Hyperlipidemia, unspecified: Secondary | ICD-10-CM | POA: Diagnosis present

## 2019-09-11 DIAGNOSIS — N400 Enlarged prostate without lower urinary tract symptoms: Secondary | ICD-10-CM | POA: Diagnosis present

## 2019-09-11 DIAGNOSIS — Z87891 Personal history of nicotine dependence: Secondary | ICD-10-CM | POA: Diagnosis not present

## 2019-09-11 DIAGNOSIS — Z7982 Long term (current) use of aspirin: Secondary | ICD-10-CM | POA: Diagnosis not present

## 2019-09-11 DIAGNOSIS — N184 Chronic kidney disease, stage 4 (severe): Secondary | ICD-10-CM | POA: Diagnosis present

## 2019-09-11 DIAGNOSIS — Z831 Family history of other infectious and parasitic diseases: Secondary | ICD-10-CM | POA: Diagnosis not present

## 2019-09-11 DIAGNOSIS — Z885 Allergy status to narcotic agent status: Secondary | ICD-10-CM | POA: Diagnosis not present

## 2019-09-11 DIAGNOSIS — D631 Anemia in chronic kidney disease: Secondary | ICD-10-CM | POA: Diagnosis present

## 2019-09-11 DIAGNOSIS — E1122 Type 2 diabetes mellitus with diabetic chronic kidney disease: Secondary | ICD-10-CM | POA: Diagnosis present

## 2019-09-11 DIAGNOSIS — Z7901 Long term (current) use of anticoagulants: Secondary | ICD-10-CM | POA: Diagnosis not present

## 2019-09-11 DIAGNOSIS — Z20822 Contact with and (suspected) exposure to covid-19: Secondary | ICD-10-CM | POA: Diagnosis present

## 2019-09-11 DIAGNOSIS — I252 Old myocardial infarction: Secondary | ICD-10-CM | POA: Diagnosis not present

## 2019-09-11 DIAGNOSIS — M109 Gout, unspecified: Secondary | ICD-10-CM | POA: Diagnosis present

## 2019-09-11 DIAGNOSIS — I130 Hypertensive heart and chronic kidney disease with heart failure and stage 1 through stage 4 chronic kidney disease, or unspecified chronic kidney disease: Secondary | ICD-10-CM | POA: Diagnosis present

## 2019-09-11 DIAGNOSIS — E1151 Type 2 diabetes mellitus with diabetic peripheral angiopathy without gangrene: Secondary | ICD-10-CM | POA: Diagnosis present

## 2019-09-11 DIAGNOSIS — D638 Anemia in other chronic diseases classified elsewhere: Secondary | ICD-10-CM | POA: Diagnosis not present

## 2019-09-11 DIAGNOSIS — Z79899 Other long term (current) drug therapy: Secondary | ICD-10-CM | POA: Diagnosis not present

## 2019-09-11 DIAGNOSIS — I2510 Atherosclerotic heart disease of native coronary artery without angina pectoris: Secondary | ICD-10-CM | POA: Diagnosis present

## 2019-09-11 DIAGNOSIS — I872 Venous insufficiency (chronic) (peripheral): Secondary | ICD-10-CM | POA: Diagnosis present

## 2019-09-11 DIAGNOSIS — I480 Paroxysmal atrial fibrillation: Secondary | ICD-10-CM | POA: Diagnosis present

## 2019-09-11 DIAGNOSIS — R600 Localized edema: Secondary | ICD-10-CM | POA: Diagnosis present

## 2019-09-11 LAB — IRON AND TIBC
Iron: 34 ug/dL — ABNORMAL LOW (ref 45–182)
Saturation Ratios: 14 % — ABNORMAL LOW (ref 17.9–39.5)
TIBC: 239 ug/dL — ABNORMAL LOW (ref 250–450)
UIBC: 205 ug/dL

## 2019-09-11 LAB — CBC
HCT: 30 % — ABNORMAL LOW (ref 39.0–52.0)
Hemoglobin: 10.3 g/dL — ABNORMAL LOW (ref 13.0–17.0)
MCH: 32.6 pg (ref 26.0–34.0)
MCHC: 34.3 g/dL (ref 30.0–36.0)
MCV: 94.9 fL (ref 80.0–100.0)
Platelets: 178 10*3/uL (ref 150–400)
RBC: 3.16 MIL/uL — ABNORMAL LOW (ref 4.22–5.81)
RDW: 14.3 % (ref 11.5–15.5)
WBC: 4.8 10*3/uL (ref 4.0–10.5)
nRBC: 0 % (ref 0.0–0.2)

## 2019-09-11 LAB — PROTIME-INR
INR: 2.3 — ABNORMAL HIGH (ref 0.8–1.2)
Prothrombin Time: 24.4 seconds — ABNORMAL HIGH (ref 11.4–15.2)

## 2019-09-11 LAB — BASIC METABOLIC PANEL
Anion gap: 11 (ref 5–15)
BUN: 49 mg/dL — ABNORMAL HIGH (ref 8–23)
CO2: 30 mmol/L (ref 22–32)
Calcium: 9 mg/dL (ref 8.9–10.3)
Chloride: 103 mmol/L (ref 98–111)
Creatinine, Ser: 2.08 mg/dL — ABNORMAL HIGH (ref 0.61–1.24)
GFR calc Af Amer: 31 mL/min — ABNORMAL LOW (ref >60)
GFR calc non Af Amer: 27 mL/min — ABNORMAL LOW (ref >60)
Glucose, Bld: 112 mg/dL — ABNORMAL HIGH (ref 70–99)
Potassium: 3.4 mmol/L — ABNORMAL LOW (ref 3.5–5.1)
Sodium: 144 mmol/L (ref 135–145)

## 2019-09-11 LAB — GLUCOSE, CAPILLARY
Glucose-Capillary: 95 mg/dL (ref 70–99)
Glucose-Capillary: 146 mg/dL — ABNORMAL HIGH (ref 70–99)

## 2019-09-11 LAB — VITAMIN B12: Vitamin B-12: 555 pg/mL (ref 180–914)

## 2019-09-11 MED ORDER — ALBUMIN HUMAN 25 % IV SOLN
25.0000 g | Freq: Once | INTRAVENOUS | Status: AC
  Administered 2019-09-11: 25 g via INTRAVENOUS
  Filled 2019-09-11: qty 100

## 2019-09-11 MED ORDER — POTASSIUM CHLORIDE 20 MEQ PO PACK
40.0000 meq | PACK | Freq: Once | ORAL | Status: AC
  Administered 2019-09-11: 40 meq via ORAL
  Filled 2019-09-11: qty 2

## 2019-09-11 MED ORDER — FUROSEMIDE 40 MG PO TABS
40.0000 mg | ORAL_TABLET | Freq: Every day | ORAL | Status: DC
  Administered 2019-09-11 – 2019-09-12 (×2): 40 mg via ORAL
  Filled 2019-09-11 (×2): qty 1

## 2019-09-11 MED ORDER — WARFARIN - PHARMACIST DOSING INPATIENT
Freq: Every day | Status: DC
  Filled 2019-09-11: qty 1

## 2019-09-11 MED ORDER — GUAIFENESIN ER 600 MG PO TB12
1200.0000 mg | ORAL_TABLET | Freq: Two times a day (BID) | ORAL | Status: DC
  Administered 2019-09-11 – 2019-09-12 (×2): 1200 mg via ORAL
  Filled 2019-09-11 (×2): qty 2

## 2019-09-11 NOTE — Consult Note (Signed)
ANTICOAGULATION CONSULT NOTE - Initial Consult  Pharmacy Consult for Warfarin Dosing and Monitoring  Indication: atrial fibrillation  Allergies  Allergen Reactions  . Morphine And Related Other (See Comments)    "nightmares" (Patient had Morphine and tolerated it fine)     Patient Measurements: Height: 5\' 7"  (170.2 cm) Weight: 94.3 kg (208 lb) IBW/kg (Calculated) : 66.1  Vital Signs: Temp: 98.2 F (36.8 C) (07/14 0500) BP: 113/75 (07/14 1102) Pulse Rate: 133 (07/14 1102)  Labs: Recent Labs    09/10/19 1317 09/10/19 2322 09/11/19 0535 09/11/19 1100  HGB 10.6*  --  10.3*  --   HCT 32.6*  --  30.0*  --   PLT 192  --  178  --   LABPROT  --  26.0*  --  24.4*  INR  --  2.5*  --  2.3*  CREATININE 2.14*  --  2.08*  --     Estimated Creatinine Clearance: 25.3 mL/min (A) (by C-G formula based on SCr of 2.08 mg/dL (H)).   Medical History: Past Medical History:  Diagnosis Date  . Arthritis   . Atrial fibrillation ([place])   . CHF (congestive heart failure) ([place])   . Chronic kidney disease    kidney stones  . Coronary artery disease   . Diabetes mellitus without complication ([place])   . Diverticulosis   . GERD (gastroesophageal reflux disease)   . Gout   . Hyperlipidemia   . Hypertension   . Myocardial infarction ([place]) 2001  . Peripheral vascular disease ([place])   . Pneumothorax, left 1950   Assessment: Pharmacy consulted for warfarin dosing and monitoring for 86 yo male with PMH of A. Fib.   Home Regimen: Warfarin 3mg    DATE INR DOSE 7/13 2.5 3mg   Goal of Therapy:  INR 2-3 Monitor platelets by anticoagulation protocol: Yes   Plan:  Will continue patient's home dose of Warfarin 3mg  daily.  Will recheck INR with AM labs.   CBCs at least every 3 days per protocol.   Pernell Dupre, PharmD, BCPS Clinical Pharmacist 09/11/2019 12:22 PM

## 2019-09-11 NOTE — Progress Notes (Signed)
Patient complains of coughing up mucus. NP Sharion Settler ordered Mucinex. Gave patient basin to cough in so sputum can be assessed. No sputum thus far. Will continue to assess and notify NP of any change. Lungs sounds: slight wheezing hear.

## 2019-09-11 NOTE — Progress Notes (Signed)
OT Cancellation Note  Patient Details Name: Carlos [name] MRN: [id number] DOB: 10-11-1926   Cancelled Treatment:    Reason Eval/Treat Not Completed: Other (comment). Consult received, chart reviewed. RN with pt to assess and ace wrap BLE. Will re-attempt OT evaluation next date as appropriate.   Jeni Salles, MPH, MS, OTR/L ascom [phone] 09/11/19, 4:38 PM

## 2019-09-11 NOTE — Progress Notes (Signed)
Pt admitted to 2A, oriented to room and unit. Expressed no needs at this time.

## 2019-09-11 NOTE — Progress Notes (Signed)
PROGRESS NOTE    Carlos [name]  AJO:[id number] DOB: Sep 29, 1926 DOA: 09/10/2019 PCP: [name], MD   Chief complaint.  Bilateral leg edema.   Brief Narrative:  Carlos [name] is a 86 y.o. male with medical history significant for chronic atrial fibrillation, diabetes mellitus with complications of stage IV chronic kidney disease, coronary artery disease, CHF who was sent to the emergency room by his nephrologist for refractory lower extremity swelling.  Patient has had progressively worsening bilateral lower extremity swelling for about 3 weeks and was seen by his cardiologist who doubled the dose of his diuretics without any improvement in the symptoms.  He went to see the nephrologist who referred him to the ER and recommended the patient be placed on IV Lasix to improve diuresis.  7/14.  Patient was placed on IV Lasix drip, he developed hypotension, was given 25 g albumin.  Leg edema much improved today after diuresis.  Change Lasix to oral.  Ace wrap bilateral legs.   Assessment & Plan:   Principal Problem:   Edema of both lower legs due to peripheral venous insufficiency Active Problems:   Chronic kidney disease (CKD), stage IV (severe) (HCC)   Essential (primary) hypertension   AF (paroxysmal atrial fibrillation) (HCC)   Diabetes mellitus, type 2 (HCC)   Type 2 diabetes mellitus with renal manifestations, controlled (HCC)   Anemia of chronic disease   Edema of both lower extremities due to peripheral venous insufficiency  #1.  Bilateral leg edema. Multifactorial, secondary to venous stasis, chronic diastolic congestive heart failure, chronic kidney disease stage IV. Patient received IV Lasix drip, he has been diuresed well.  Leg edema seem to be improving.  I will change Lasix to oral, also apply Ace wrap.  Patient has significant weakness, hypertension physical therapy occupational therapy evaluation..  Patient can be discharged home tomorrow  2.  Intermittent black stools.  Patient states that he had intermittent black stools, no black stool for the last 2 days.  We will check stool for occult blood, patient be referred to follow-up with GI.    3.  Anemia. Check occult blood.  Also check iron B12 level.  4.  Chronic kidney disease stage IV. Renal function still stable after diuretics.  Recheck BMP tomorrow.  5.  Atrial fibrillation. Continue anticoagulation for now.  6.  Type 2 diabetes. Continue current coverage.   DVT prophylaxis: Warfarin Code Status: Full Family Communication: None Disposition Plan:  . Patient came from: Home            . Anticipated d/c place: Home . Barriers to d/c OR conditions which need to be met to effect a safe d/c:   Consultants:   None  Procedures: None Antimicrobials:None  Subjective: Patient developed significant hypotension after diuretics, he received 25 g of albumin.  Currently he feels better.  He denies any short of breath cough, he has no nausea vomiting or diarrhea.  Objective: Vitals:   09/11/19 1018 09/11/19 1029 09/11/19 1102 09/11/19 1256  BP: (!) 80/50 (!) 90/42 113/75 93/73  Pulse: 69  (!) 133 65  Resp: (!) _0 Temp:    97.8 F (36.6 C)  TempSrc:      SpO2: 97%  (!) 84% 97%  Weight:    97.2 kg  Height:    _1  (1.702 m)    Intake/Output Summary (Last 24 hours) at 09/11/2019 1623 Last data filed at 09/11/2019 1142 Gross per 24 hour  Intake -  Output  1750 ml  Net -1750 ml   Filed Weights   09/10/19 1314 09/11/19 1256  Weight: 94.3 kg 97.2 kg    Examination:  General exam: Appears calm and comfortable  Respiratory system: Clear to auscultation. Respiratory effort normal. Cardiovascular system: Irregular. No JVD, murmurs, rubs, gallops or clicks. 2-3+ pedal edema. Gastrointestinal system: Abdomen is nondistended, soft and nontender. No organomegaly or masses felt. Normal bowel sounds heard. Central nervous system: Alert and oriented. No focal neurological deficits.  Extremities: Symmetric 5 x 5 power. Skin: No rashes, lesions or ulcers Psychiatry: Judgement and insight appear normal. Mood & affect appropriate.     Data Reviewed: I have personally reviewed following labs and imaging studies  CBC: Recent Labs  Lab 09/05/19 1356 09/10/19 1317 09/11/19 0535  WBC 5.2 4.7 4.8  HGB 10.9* 10.6* 10.3*  HCT 32.2* 32.6* 30.0*  MCV 95.8 97.0 94.9  PLT 182 192 711   Basic Metabolic Panel: Recent Labs  Lab 09/05/19 1356 09/10/19 1317 09/11/19 0535  NA 141 140 144  K 3.9 3.8 3.4*  CL 101 103 103  CO2 _0 GLUCOSE 114* 127* 112*  BUN 51* 54* 49*  CREATININE 2.11* 2.14* 2.08*  CALCIUM 9.2 9.1 9.0   GFR: Estimated Creatinine Clearance: 25.7 mL/min (A) (by C-G formula based on SCr of 2.08 mg/dL (H)). Liver Function Tests: Recent Labs  Lab 09/05/19 1635  AST 29  ALT 18  ALKPHOS 54  BILITOT 0.9  PROT 6.7  ALBUMIN 3.8   No results for input(s): LIPASE, AMYLASE in the last 168 hours. No results for input(s): AMMONIA in the last 168 hours. Coagulation Profile: Recent Labs  Lab 09/05/19 1356 09/10/19 2322 09/11/19 1100  INR 3.2* 2.5* 2.3*   Cardiac Enzymes: No results for input(s): CKTOTAL, CKMB, CKMBINDEX, TROPONINI in the last 168 hours. BNP (last 3 results) No results for input(s): PROBNP in the last 8760 hours. HbA1C: No results for input(s): HGBA1C in the last 72 hours. CBG: Recent Labs  Lab 09/11/19 0921  GLUCAP 95   Lipid Profile: No results for input(s): CHOL, HDL, LDLCALC, TRIG, CHOLHDL, LDLDIRECT in the last 72 hours. Thyroid Function Tests: No results for input(s): TSH, T4TOTAL, FREET4, T3FREE, THYROIDAB in the last 72 hours. Anemia Panel: No results for input(s): VITAMINB12, FOLATE, FERRITIN, TIBC, IRON, RETICCTPCT in the last 72 hours. Sepsis Labs: No results for input(s): PROCALCITON, LATICACIDVEN in the last 168 hours.  Recent Results (from the past 240 hour(s))  SARS Coronavirus 2 by RT PCR (hospital  order, performed in [facility] hospital lab) Nasopharyngeal Nasopharyngeal Swab     Status: None   Collection Time: 09/10/19  2:32 PM   Specimen: Nasopharyngeal Swab  Result Value Ref Range Status   SARS Coronavirus 2 NEGATIVE NEGATIVE Final    Comment: (NOTE) SARS-CoV-2 target nucleic acids are NOT DETECTED.  The SARS-CoV-2 RNA is generally detectable in upper and lower respiratory specimens during the acute phase of infection. The lowest concentration of SARS-CoV-2 viral copies this assay can detect is 250 copies / mL. A negative result does not preclude SARS-CoV-2 infection and should not be used as the sole basis for treatment or other patient management decisions.  A negative result may occur with improper specimen collection / handling, submission of specimen other than nasopharyngeal swab, presence of viral mutation(s) within the areas targeted by this assay, and inadequate number of viral copies (<250 copies / mL). A negative result must be combined with clinical observations, patient history, and epidemiological  information.  Fact Sheet for Patients:   StrictlyIdeas.no  Fact Sheet for Healthcare Providers: BankingDealers.co.za  This test is not yet approved or  cleared by the [place] FDA and has been authorized for detection and/or diagnosis of SARS-CoV-2 by FDA under an Emergency Use Authorization (EUA).  This EUA will remain in effect (meaning this test can be used) for the duration of the COVID-19 declaration under Section 564(b)(1) of the Act, 21 U.S.C. section 360bbb-3(b)(1), unless the authorization is terminated or revoked sooner.  Performed at [facility], [address], [place], [place] [address]          Radiology Studies: DG Chest 2 View  Result Date: 09/10/2019 CLINICAL DATA:  Lower extremity edema EXAM: CHEST - 2 VIEW COMPARISON:  September 05, 2019 and December 12, 2013 FINDINGS: There are  calcified granulomas bilaterally. There is apparent pleural calcification in the left upper thorax, unchanged. There is no frank edema or airspace opacity. Heart size is upper normal with pulmonary vascularity normal. Patient is status post coronary artery bypass grafting. There is aortic atherosclerosis. No adenopathy. No bone lesions. IMPRESSION: Calcified granulomas. Areas of pleural plaque. Suspect previous asbestos exposure. No edema or airspace opacity. Heart upper normal in size. Status post coronary artery bypass grafting. Aortic Atherosclerosis (ICD10-I70.0). Electronically Signed   By: Lowella Grip III M.D.   On: 09/10/2019 13:39        Scheduled Meds: . aspirin EC  81 mg Oral QHS  . furosemide  40 mg Oral Daily  . losartan  25 mg Oral Daily  . metoprolol succinate  25 mg Oral Daily  . multivitamin with minerals  1 tablet Oral Daily  . omega-3 acid ethyl esters  1 g Oral Daily  . pantoprazole  40 mg Oral Daily  . potassium chloride  40 mEq Oral Once  . simvastatin  40 mg Oral q1800  . sodium chloride flush  3 mL Intravenous Once  . sodium chloride flush  3 mL Intravenous Q12H  . warfarin  3 mg Oral q1800  . Warfarin - Pharmacist Dosing Inpatient   Does not apply q1600   Continuous Infusions: . sodium chloride       LOS: 0 days    Time spent: 28 minutes    Sharen Hones, MD [facility]   To contact the attending provider between 7A-7P or the covering provider during after hours 7P-7A, please log into the web site [email or web address] and access using universal [place] password for that web site. If you do not have the password, please call the hospital operator.  09/11/2019, 4:23 PM

## 2019-09-12 DIAGNOSIS — E1122 Type 2 diabetes mellitus with diabetic chronic kidney disease: Secondary | ICD-10-CM

## 2019-09-12 LAB — PROTIME-INR
INR: 2.3 — ABNORMAL HIGH (ref 0.8–1.2)
Prothrombin Time: 24.8 s — ABNORMAL HIGH (ref 11.4–15.2)

## 2019-09-12 LAB — CBC WITH DIFFERENTIAL/PLATELET
Abs Immature Granulocytes: 0.02 K/uL (ref 0.00–0.07)
Basophils Absolute: 0 K/uL (ref 0.0–0.1)
Basophils Relative: 1 %
Eosinophils Absolute: 0.4 K/uL (ref 0.0–0.5)
Eosinophils Relative: 6 %
HCT: 31.6 % — ABNORMAL LOW (ref 39.0–52.0)
Hemoglobin: 10.3 g/dL — ABNORMAL LOW (ref 13.0–17.0)
Immature Granulocytes: 0 %
Lymphocytes Relative: 24 %
Lymphs Abs: 1.3 K/uL (ref 0.7–4.0)
MCH: 31.9 pg (ref 26.0–34.0)
MCHC: 32.6 g/dL (ref 30.0–36.0)
MCV: 97.8 fL (ref 80.0–100.0)
Monocytes Absolute: 0.6 K/uL (ref 0.1–1.0)
Monocytes Relative: 10 %
Neutro Abs: 3.3 K/uL (ref 1.7–7.7)
Neutrophils Relative %: 59 %
Platelets: 185 K/uL (ref 150–400)
RBC: 3.23 MIL/uL — ABNORMAL LOW (ref 4.22–5.81)
RDW: 14.3 % (ref 11.5–15.5)
WBC: 5.6 K/uL (ref 4.0–10.5)
nRBC: 0 % (ref 0.0–0.2)

## 2019-09-12 LAB — BASIC METABOLIC PANEL
Anion gap: 11 (ref 5–15)
BUN: 43 mg/dL — ABNORMAL HIGH (ref 8–23)
CO2: 29 mmol/L (ref 22–32)
Calcium: 9.5 mg/dL (ref 8.9–10.3)
Chloride: 102 mmol/L (ref 98–111)
Creatinine, Ser: 1.98 mg/dL — ABNORMAL HIGH (ref 0.61–1.24)
GFR calc Af Amer: 33 mL/min — ABNORMAL LOW (ref >60)
GFR calc non Af Amer: 29 mL/min — ABNORMAL LOW (ref >60)
Glucose, Bld: 105 mg/dL — ABNORMAL HIGH (ref 70–99)
Potassium: 3.6 mmol/L (ref 3.5–5.1)
Sodium: 142 mmol/L (ref 135–145)

## 2019-09-12 LAB — MAGNESIUM: Magnesium: 2.2 mg/dL (ref 1.7–2.4)

## 2019-09-12 MED ORDER — FERROUS SULFATE 325 (65 FE) MG PO TABS: 325.0000 mg | ORAL_TABLET | Freq: Every day | ORAL | 0 refills | Status: AC

## 2019-09-12 NOTE — Discharge Summary (Signed)
Physician Discharge Summary  Patient ID: Carlos [name] MRN: [id number] DOB/AGE: Apr 30, 1926 86 y.o.  Admit date: 09/10/2019 Discharge date: 09/12/2019  Admission Diagnoses:  Discharge Diagnoses:  Principal Problem:   Edema of both lower legs due to peripheral venous insufficiency Active Problems:   Chronic kidney disease (CKD), stage IV (severe) (HCC)   Essential (primary) hypertension   AF (paroxysmal atrial fibrillation) (HCC)   Diabetes mellitus, type 2 (HCC)   Type 2 diabetes mellitus with renal manifestations, controlled (HCC)   Anemia of chronic disease   Edema of both lower extremities due to peripheral venous insufficiency   Discharged Condition: good  Hospital Course:   Carlos [name] a 86 y.o.malewith medical history significant forchronic atrial fibrillation, diabetes mellitus with complications of stage IV chronic kidney disease, coronary artery disease, CHF who was sent to the emergency room by his nephrologist for refractory lower extremity swelling. Patient has had progressively worsening bilateral lower extremity swelling for about 3 weeks and was seen by his cardiologist who doubled the dose of his diuretics without any improvement in the symptoms. He went to see the nephrologist who referred him to the ER and recommended the patient be placed on IV Lasix to improve diuresis.  7/14.  Patient was placed on IV Lasix drip, he developed hypotension, was given 25 g albumin.  Leg edema much improved today after diuresis.  Change Lasix to oral.  Ace wrap bilateral legs.  #1.  Bilateral leg edema. Multifactorial, secondary to venous stasis, chronic diastolic congestive heart failure, chronic kidney disease stage IV. Patient received IV Lasix drip, he has been diuresed well.  Leg edema seem to be improving.  At this point, I resume patient home dose diuretics.  Patient is medically stable to be discharged.  He has some weakness, will set up home PT.  2.  Intermittent black  stools. Patient states that he had intermittent black stools, no black stool for the last 3 days.  Patient hemoglobin has been stable at about 10.  He will be followed by GI as outpatient.  3.  Anemia with iron deficiency. B12 is normal, iron level is low.  We will start oral supplements.  4.  Chronic kidney disease stage IV. Renal function still stable after diuretics.  Recheck BMP tomorrow.  5.  Atrial fibrillation. Continue anticoagulation.  6.  Type 2 diabetes. Follow  with PCP.  Consults: None  Significant Diagnostic Studies: {  Treatments: Lasx drip  Discharge Exam: Blood pressure 114/70, pulse 67, temperature 99.1 F (37.3 C), temperature source Oral, resp. rate 19, height 5\' 7"  (1.702 m), weight 92.6 kg, SpO2 93 %. General appearance: alert, cooperative and oriented x3 Resp: Decreased breathing sounds without crackles or wheezes Cardio: irregularly irregular rhythm GI: soft, non-tender; bowel sounds normal; no masses,  no organomegaly Extremities: edema 1+  Disposition: Discharge disposition: 01-Home or Self Care       Discharge Instructions    Diet - low sodium heart healthy   Complete by: As directed    Increase activity slowly   Complete by: As directed      Allergies as of 09/12/2019      Reactions   Morphine And Related Other (See Comments)   "nightmares" (Patient had Morphine and tolerated it fine)       Medication List    TAKE these medications   acetaminophen 500 MG tablet Commonly known as: TYLENOL Take 500 mg by mouth as needed for mild pain.   allopurinol 300 MG tablet Commonly known as:  ZYLOPRIM Take 150 mg by mouth daily.   aspirin EC 81 MG tablet Take 81 mg by mouth at bedtime.   finasteride 5 MG tablet Commonly known as: PROSCAR Take 1 tablet (5 mg total) by mouth daily.   Fish Oil 1000 MG Caps Take 1 capsule by mouth daily.   loratadine 10 MG tablet Commonly known as: CLARITIN Take 10 mg by mouth daily as needed.    losartan 25 MG tablet Commonly known as: COZAAR Take 25 mg by mouth daily.   metoprolol succinate 25 MG 24 hr tablet Commonly known as: TOPROL-XL Take 25 mg by mouth daily.   Multi-Vitamins Tabs Take 1 tablet by mouth daily.   pantoprazole 40 MG tablet Commonly known as: PROTONIX Take 40 mg by mouth daily.   simvastatin 40 MG tablet Commonly known as: ZOCOR Take 40 mg by mouth daily at 6 PM.   tamsulosin 0.4 MG Caps capsule Commonly known as: FLOMAX Take 1 capsule (0.4 mg total) by mouth daily.   torsemide 20 MG tablet Commonly known as: DEMADEX Take 2 tablets (40 mg total) by mouth daily.   warfarin 3 MG tablet Commonly known as: COUMADIN Take 3 mg by mouth daily at 6 PM.       Follow-up Information    [facility] Follow up on 09/27/2019.   Specialty: Cardiology Why: at 2:00pm. Enter through the [place] entrance Contact information: [place] [phone]       [name], MD Follow up in 1 week(s).   Specialty: Family Medicine Contact information: [place] [address] [phone]               Signed: Sharen Hones 09/12/2019, 10:55 AM

## 2019-09-12 NOTE — Progress Notes (Signed)
Patient complained of leg pain from Ace wraps. I removed the bandages as they appeared very tight and toes were slightly cool. He reports legs feel better now. I will assess and reapply ace wraps in morning. Feet are elevated on two pillows now and foot of bed elevated also. Right foot +2 pitting edema at toes.

## 2019-09-12 NOTE — Evaluation (Signed)
Occupational Therapy Evaluation Patient Details Name: Carlos [name] MRN: [id number] DOB: [birth year] Today's Date: 09/12/2019    History of Present Illness Pt is a 86 y.o. male presenting to hospital 7/13 with significant increased LE swelling (Nephrology referred pt to ED d/t worsening edema).  Pt admitted with B LE edema, intermittent black stools, anemia, and CKD stage IV.  PMH includes CHF, a-fib, CKD, CAD, DM, htn, MI, PVD, bradycardia, TB, cardiac surgery (4 bypass), femur surgery (R), and R TKR.   Clinical Impression   Carlos [name]" Carlos [name] was seen for OT evaluation this date. Prior to hospital admission, pt was generally independent in ADL management. He reports using a RW for functional mobility in the home and has recently begun to sit for showers using his built-in bench to maximize safety. Pt endorses 3-4 falls in the past 6 months, he states that he tends to fall backwards or off of stools in his home. Pt lives alone in a 1 level house, and has a life-alert system. Carlos [name] is received for OT dressed in street clothes. He is pleasant and engaged t/o OT session. 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 Currently pt demonstrates impairments as described below (See OT problem list) which functionally limit his ability to perform ADL/self-care tasks. Pt currently requires supervision for safety during functional mobility and exertional ADL tasks including bathing and dressing.  Pt would benefit from skilled OT services to address noted impairments and functional limitations (see below for any additional details) in order to maximize safety and independence while minimizing falls risk and caregiver burden. Upon hospital discharge, recommend HHOT to maximize pt safety and return to functional independence during meaningful occupations of daily life.      Follow Up Recommendations  Home health OT    Equipment Recommendations   None recommended by OT    Recommendations for Other Services       Precautions / Restrictions Precautions Precautions: Fall Restrictions Weight Bearing Restrictions: No      Mobility Bed Mobility               General bed mobility comments: Deferred (pt resting in recliner beginning/end of session)  Transfers Overall transfer level: Modified independent Equipment used: Rolling walker (2 wheeled)             General transfer comment: mild increased effort to stand; steady with transfers    Balance Overall balance assessment: Needs assistance Sitting-balance support: No upper extremity supported;Feet supported Sitting balance-Leahy Scale: Normal Sitting balance - Comments: steady sitting reaching outside BOS   Standing balance support: No upper extremity supported Standing balance-Leahy Scale: Good Standing balance comment: steady standing reaching within BOS                           ADL either performed or assessed with clinical judgement   ADL Overall ADL's : Needs assistance/impaired                                       General ADL Comments: Pt functionally limited by generalized weakness and decreased safety awareness. He requires supervision for safety during functional mobility. Supervision during LB dressing tasks. Pt would benefit from safety education re: falls prevention and compensatory dressing strategies to maximize functional independence and safety during meaningful occupations of daily  life.     Vision Baseline Vision/History: Wears glasses Wears Glasses: At all times Patient Visual Report: No change from baseline Additional Comments: Pt noted with L eye twitching, he states this is baseline.     Perception     Praxis      Pertinent Vitals/Pain Pain Assessment: No/denies pain     Hand Dominance     Extremity/Trunk Assessment Upper Extremity Assessment Upper Extremity Assessment: Generalized weakness    Lower Extremity Assessment Lower Extremity Assessment: Generalized weakness   Cervical / Trunk Assessment Cervical / Trunk Assessment: Normal   Communication Communication Communication: No difficulties   Cognition Arousal/Alertness: Awake/alert Behavior During Therapy: WFL for tasks assessed/performed Overall Cognitive Status: Within Functional Limits for tasks assessed                                     General Comments  Mild instability during dynamic standing tasks.  Pt requires min cueing for safe use of RW this date.    Exercises Other Exercises Other Exercises: Pt educated on role of OT in acute setting, safe use of AE/DME for ADL management, Safe transfer techniques, and falls prevention/recovery strategies to maximize safety and functional independence at home.   Shoulder Instructions      Home Living Family/patient expects to be discharged to:: Private residence Living Arrangements: Alone Available Help at Discharge: Friend(s) Type of Home: House Home Access: Stairs to enter [employer] of Steps: 4 Entrance Stairs-Rails: Right Home Layout: One level     Bathroom Shower/Tub: Occupational psychologist: [place]: Grab bars - tub/shower;Walker - 4 wheels;Cane - single point;Grab bars - toilet;Hand held shower head;Shower seat - built in          Prior Functioning/Environment Level of Independence: Independent with assistive device(s)        Comments: Ambulates with SPC in community usually but uses rollator within home (has multiple rollators); h/o falls (3-4 falls in past 6 months--usually falls backwards or off chair)        OT Problem List: Decreased strength;Decreased coordination;Decreased safety awareness;Decreased knowledge of use of DME or AE;Decreased activity tolerance      OT Treatment/Interventions: Self-care/ADL training;Therapeutic exercise;Therapeutic activities;DME and/or AE  instruction;Patient/family education;Balance training    OT Goals(Current goals can be found in the care plan section) Acute Rehab OT Goals Patient Stated Goal: to go home OT Goal Formulation: With patient Time For Goal Achievement: 09/26/19 Potential to Achieve Goals: Good ADL Goals Pt Will Perform Grooming: with modified independence;standing;with adaptive equipment Pt Will Perform Lower Body Dressing: sit to/from stand;with modified independence;with adaptive equipment (c LRAD PRN for safety.) Pt Will Transfer to Toilet: regular height toilet;ambulating;with modified independence (c LRAD PRN for safety.) Pt Will Perform Toileting - Clothing Manipulation and hygiene: sit to/from stand;with modified independence;with adaptive equipment (c LRAD PRN for safety.) Additional ADL Goal #1: Pt will independently verbalize a plan to implement at least 3 learned falls prevention strategies into his daily routines/home environment for improved safety and fxl independence upon hospital DC.  OT Frequency: Min 1X/week   Barriers to D/C: Decreased caregiver support          Co-evaluation              AM-PAC OT "6 Clicks" Daily Activity     Outcome Measure Help from another person eating meals?: None Help from another person taking  care of personal grooming?: None Help from another person toileting, which includes using toliet, bedpan, or urinal?: A Little Help from another person bathing (including washing, rinsing, drying)?: A Little Help from another person to put on and taking off regular upper body clothing?: None Help from another person to put on and taking off regular lower body clothing?: A Little 6 Click Score: 21   End of Session Equipment Utilized During Treatment: Gait belt;Rolling walker Nurse Communication: Mobility status  Activity Tolerance: Patient tolerated treatment well Patient left: in chair;with call bell/phone within reach;with chair alarm set  OT Visit Diagnosis:  Other abnormalities of gait and mobility (R26.89);Muscle weakness (generalized) (M62.81)                Time: 1324-1400 OT Time Calculation (min): 36 min Charges:  OT General Charges $OT Visit: 1 Visit OT Evaluation $OT Eval Moderate Complexity: 1 Mod OT Treatments $Self Care/Home Management : 23-37 mins  Shara Blazing, M.S., OTR/L Ascom: [phone] 09/12/19, 3:19 PM

## 2019-09-12 NOTE — TOC Initial Note (Signed)
Transition of Care [facility]) - Initial/Assessment Note    Patient Details  Name: Carlos [name] MRN: [id number] Date of Birth: [birth year]  [facility]) CM/SW Contact:    Shelbie Ammons, RN Phone Number: 09/12/2019, 3:17 PM  Clinical Narrative:   RNCM assessed patient at bedside. Patient was in the process of working with OT and was waiting for PT to come in. Patient reports that he lives alone in a single family home in [place] and has for many years since his wife passed away. Patient reports that he still drives and has walkers available that he can use and has them placed at "strategic areas". Patient denies any needs for any further equipment at this time. Patient is agreeable to OPPT being set up and reports that he has been seen by Nicole Kindred before. RNCM placed call to [facility] in [place] and they wouldn't be able to see him until early August but they can get him seen initially sooner in [place] at the [facility] facility. RNCM made patient aware this.       Expected Discharge Plan: OP Rehab Barriers to Discharge: Barriers Resolved   Patient Goals and CMS Choice        Expected Discharge Plan and Services Expected Discharge Plan: OP Rehab   Discharge Planning Services: CM Consult   Living arrangements for the past 2 months: Single Family Home Expected Discharge Date: 09/12/19                                    Prior Living Arrangements/Services Living arrangements for the past 2 months: Single Family Home Lives with:: Self Patient language and need for interpreter reviewed:: Yes Do you feel safe going back to the place where you live?: No      Need for Family Participation in Patient Care: Yes (Comment) Care giver support system in place?: Yes (comment)   Criminal Activity/Legal Involvement Pertinent to Current Situation/Hospitalization: No - Comment as needed  Activities of Daily Living Home Assistive Devices/Equipment: Cane (specify quad  or straight) ADL Screening (condition at time of admission) Patient's cognitive ability adequate to safely complete daily activities?: Yes Is the patient deaf or have difficulty hearing?: No Does the patient have difficulty seeing, even when wearing glasses/contacts?: No Does the patient have difficulty concentrating, remembering, or making decisions?: No Patient able to express need for assistance with ADLs?: Yes Does the patient have difficulty dressing or bathing?: No Independently performs ADLs?: Yes (appropriate for developmental age) Does the patient have difficulty walking or climbing stairs?: Yes Weakness of Legs: Both Weakness of Arms/Hands: None  Permission Sought/Granted                  Emotional Assessment Appearance:: Appears stated age Attitude/Demeanor/Rapport: Engaged Affect (typically observed): Appropriate Orientation: : Oriented to Self, Oriented to Place, Oriented to  Time, Oriented to Situation Alcohol / Substance Use: Not Applicable Psych Involvement: No (comment)  Admission diagnosis:  Bilateral lower extremity edema [R60.0] Chronic kidney disease, unspecified CKD stage [N18.9] 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 Anemia of chronic disease 09/10/2019  . 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 Acute on chronic combined systolic and diastolic CHF (congestive heart failure) ([place]) 05/23/2019  .  Acute pain of left shoulder 09/08/2015  . CKD (chronic kidney disease), stage IV ([place]) 12/24/2014  . Bradycardia 12/02/2014  . TI (tricuspid incompetence) 10/21/2014  . DD (diverticular disease) 07/30/2014  . Essential (primary) hypertension 07/30/2014  . Tuberculosis 07/30/2014  . Hyperlipidemia 07/30/2014  . Chronic systolic CHF (congestive  heart failure), NYHA class 2 ([place]) 02/12/2014  . AF (paroxysmal atrial fibrillation) ([place]) 02/12/2014  . Paroxysmal A-fib ([place]) 02/12/2014  . Accumulation of fluid in tissues 07/24/2013  . Acid reflux 07/24/2013  . Gout 07/24/2013  . Diabetes mellitus, type 2 ([place]) 07/24/2013  . Type 2 diabetes mellitus with renal manifestations, controlled ([place]) 07/24/2013  . Peripheral blood vessel disorder ([place]) 07/19/2013  . PVD (peripheral vascular disease) ([place]) 07/19/2013  . Combined fat and carbohydrate induced hyperlipemia 05/21/2013  . Encounter for therapeutic drug monitoring 12/18/2012  . Chronic kidney disease (CKD), stage IV (severe) ([place]) 12/18/2012  . Arteriosclerosis of coronary artery 12/18/2012  . Decreased potassium in the blood 12/18/2012  . H/O coronary artery bypass surgery 12/18/2012  . H/O total knee replacement 12/18/2012  . Encounter for therapeutic drug level monitoring 12/18/2012   PCP:  [name], MD Pharmacy:   [place], [place] - [address] [place] [address] Phone: [phone] Fax: [phone]  [facility] - [place], [address] [place] [address] Phone: [phone] Fax: [phone]     Social Determinants of Health (SDOH) Interventions    Readmission Risk Interventions No flowsheet data found.

## 2019-09-12 NOTE — Evaluation (Signed)
Physical Therapy Evaluation Patient Details Name: Carlos [name] MRN: [id number] DOB: 11-24-1926 Today's Date: 09/12/2019   History of Present Illness  Pt is a 86 y.o. male presenting to hospital 7/13 with significant increased LE swelling (Nephrology referred pt to ED d/t worsening edema).  Pt admitted with B LE edema, intermittent black stooks, anemia, and CKD stage IV.  PMH includes CHF, a-fib, CKD, CAD, DM, htn, MI, PVD, bradycardia, TB, cardiac surgery (4 bypass), femur surgery (R), and R TKR.  Clinical Impression  Prior to hospital admission, pt was modified independent with ambulation (used rollator in home and [facility] usually in community); lives alone in 1 level home.  Currently pt is modified independent with transfers and CGA with ambulation around nursing loop with RW.  Pt overall steady ambulating with RW (pt declined walking with SPC d/t wanting a little more stability walking in hallways).  Generalized weakness noted.  Pt would benefit from skilled PT to address noted impairments and functional limitations (see below for any additional details).  Upon hospital discharge, pt would benefit from [place].    Follow Up Recommendations Home health PT    Equipment Recommendations   (pt appears appropriate for 4ww use (pt has 4ww at home))    Recommendations for Other Services OT consult     Precautions / Restrictions Precautions Precautions: Fall Restrictions Weight Bearing Restrictions: No      Mobility  Bed Mobility               General bed mobility comments: Deferred (pt resting in recliner beginning/end of session)  Transfers Overall transfer level: Modified independent Equipment used: Rolling walker (2 wheeled)             General transfer comment: mild increased effort to stand; steady with transfers  Ambulation/Gait Ambulation/Gait assistance: Min guard Gait Distance (Feet): 200 Feet Assistive device: Rolling walker (2 wheeled)   Gait velocity: mildly  decreased   General Gait Details: decreased stance time R LE; step through gait pattern; steady with RW  Stairs Stairs:  (pt declined stairs (pt reports he was tired from walking and did not feel he needed to try stairs prior to discharge today--pt reports no concerns with stairs navigation))          Wheelchair Mobility    Modified Rankin (Stroke Patients Only)       Balance Overall balance assessment: Needs assistance Sitting-balance support: No upper extremity supported;Feet supported Sitting balance-Leahy Scale: Normal Sitting balance - Comments: steady sitting reaching outside BOS   Standing balance support: No upper extremity supported Standing balance-Leahy Scale: Good Standing balance comment: steady standing reaching within BOS                             Pertinent Vitals/Pain Pain Assessment: No/denies pain  Vitals (HR and O2 on room air) stable and WFL throughout treatment session.    Home Living Family/patient expects to be discharged to:: Private residence Living Arrangements: Alone Available Help at Discharge: Friend(s) Type of Home: House Home Access: Stairs to enter Entrance Stairs-Rails: Right Entrance Stairs-Number of Steps: 4 Home Layout: One level Daisy: Grab bars - tub/shower;Walker - 4 wheels;Cane - single point      Prior Function Level of Independence: Independent with assistive device(s)         Comments: Ambulates with SPC in community usually but uses rollator within home (has multiple rollators); h/o falls (3-4 falls in past 6 months--usually falls backwards  or off chair)     Hand Dominance        Extremity/Trunk Assessment   Upper Extremity Assessment Upper Extremity Assessment: Generalized weakness    Lower Extremity Assessment Lower Extremity Assessment: Generalized weakness    Cervical / Trunk Assessment Cervical / Trunk Assessment: Normal  Communication   Communication: No difficulties   Cognition Arousal/Alertness: Awake/alert Behavior During Therapy: WFL for tasks assessed/performed Overall Cognitive Status: Within Functional Limits for tasks assessed                                        General Comments   Nursing cleared pt for participation in physical therapy.  Pt agreeable to PT session.    Exercises     Assessment/Plan    PT Assessment Patient needs continued PT services  PT Problem List Decreased strength;Decreased activity tolerance;Decreased balance;Decreased mobility       PT Treatment Interventions DME instruction;Gait training;Stair training;Functional mobility training;Therapeutic activities;Therapeutic exercise;Balance training;Patient/family education    PT Goals (Current goals can be found in the Care Plan section)  Acute Rehab PT Goals Patient Stated Goal: to go home PT Goal Formulation: With patient Time For Goal Achievement: 09/26/19 Potential to Achieve Goals: Good    Frequency Min 2X/week   Barriers to discharge        Co-evaluation               AM-PAC PT "6 Clicks" Mobility  Outcome Measure Help needed turning from your back to your side while in a flat bed without using bedrails?: None Help needed moving from lying on your back to sitting on the side of a flat bed without using bedrails?: None Help needed moving to and from a bed to a chair (including a wheelchair)?: A Little Help needed standing up from a chair using your arms (e.g., wheelchair or bedside chair)?: A Little Help needed to walk in hospital room?: A Little Help needed climbing 3-5 steps with a railing? : A Little 6 Click Score: 20    End of Session Equipment Utilized During Treatment: Gait belt Activity Tolerance: Patient tolerated treatment well Patient left: in chair;with call bell/phone within reach;Other (comment) (nurse reports pt did not need chair alarm) Nurse Communication: Mobility status;Precautions PT Visit Diagnosis: Other  abnormalities of gait and mobility (R26.89);Muscle weakness (generalized) (M62.81);History of falling (Z91.81)    Time: 6060-0459 PT Time Calculation (min) (ACUTE ONLY): 15 min   Charges:   PT Evaluation $PT Eval Low Complexity: 1 Low         Yarelin Reichardt, PT 09/12/19, 2:42 PM

## 2019-09-12 NOTE — Consult Note (Signed)
Carlos [name] for Warfarin Dosing and Monitoring  Indication: atrial fibrillation  Allergies  Allergen Reactions  . Morphine And Related Other (See Comments)    "nightmares" (Patient had Morphine and tolerated it fine)     Patient Measurements: Height: 5\' 7"  (170.2 cm) Weight: 92.6 kg (204 lb 3.2 oz) IBW/kg (Calculated) : 66.1  Vital Signs: Temp: 99.1 F (37.3 C) (07/15 0752) Temp Source: Oral (07/15 0752) BP: 114/70 (07/15 0752) Pulse Rate: 67 (07/15 0752)  Labs: Recent Labs    09/10/19 1317 09/10/19 1317 09/10/19 2322 09/11/19 0535 09/11/19 1100 09/12/19 0455  HGB 10.6*   < >  --  10.3*  --  10.3*  HCT 32.6*  --   --  30.0*  --  31.6*  PLT 192  --   --  178  --  185  LABPROT  --   --  26.0*  --  24.4* 24.8*  INR  --   --  2.5*  --  2.3* 2.3*  CREATININE 2.14*  --   --  2.08*  --  1.98*   < > = values in this interval not displayed.    Estimated Creatinine Clearance: 26.4 mL/min (A) (by C-G formula based on SCr of 1.98 mg/dL (H)).   Medical History: Past Medical History:  Diagnosis Date  . Arthritis   . Atrial fibrillation (Carlos [name])   . CHF (congestive heart failure) ([place])   . Chronic kidney disease    kidney stones  . Coronary artery disease   . Diabetes mellitus without complication ([place])   . Diverticulosis   . GERD (gastroesophageal reflux disease)   . Gout   . Hyperlipidemia   . Hypertension   . Myocardial infarction ([place]) 2001  . Peripheral vascular disease ([place])   . Pneumothorax, left 1950   Assessment: Pharmacy consulted for warfarin dosing and monitoring for 86 yo male with PMH of A. Fib. Note of possible black stools. Hgb/plt remain stable.   DDI: asa, APAP - increase bleeding.   Home Regimen: Warfarin 3mg    DATE INR DOSE 7/13 2.5 3mg  7/14 2.3 3 mg 7/15 2.3 3 mg  Goal of Therapy:  INR 2-3 Monitor platelets by anticoagulation protocol: Yes   Plan:  INR is therapeutic. Warfarin 3 mg daily ordered.  Daily INR ordered. CBC at least every 3 days.   Carlos [name], PharmD, BCPS Clinical Pharmacist 09/12/2019 7:57 AM

## 2019-09-12 NOTE — Progress Notes (Signed)
Discharge instructions explained to pt/ verbalized an understanding/ iv and tele removed/ will transport off unit via wheelchair.  

## 2019-09-13 ENCOUNTER — Telehealth: Payer: Self-pay | Admitting: Family

## 2019-09-13 NOTE — Telephone Encounter (Signed)
Unable to reach patient regarding his new patient CHF Clinic appointment that was made after patients recent discharge for 7/30.   Arabelle Bollig, NT

## 2019-09-16 ENCOUNTER — Ambulatory Visit: Payer: Medicare Other

## 2019-09-16 ENCOUNTER — Other Ambulatory Visit: Payer: Self-pay

## 2019-09-16 ENCOUNTER — Ambulatory Visit
Admission: EM | Admit: 2019-09-16 | Discharge: 2019-09-16 | Disposition: A | Discharge status: Home / Self Care | Payer: Medicare Other | Attending: Family Medicine | Admitting: Family Medicine

## 2019-09-16 ENCOUNTER — Encounter: Payer: Self-pay | Admitting: Emergency Medicine

## 2019-09-16 DIAGNOSIS — R5383 Other fatigue: Secondary | ICD-10-CM | POA: Insufficient documentation

## 2019-09-16 DIAGNOSIS — R05 Cough: Secondary | ICD-10-CM | POA: Insufficient documentation

## 2019-09-16 DIAGNOSIS — R059 Cough, unspecified: Secondary | ICD-10-CM

## 2019-09-16 LAB — BASIC METABOLIC PANEL
Anion gap: 10 (ref 5–15)
BUN: 54 mg/dL — ABNORMAL HIGH (ref 8–23)
CO2: 27 mmol/L (ref 22–32)
Calcium: 9 mg/dL (ref 8.9–10.3)
Chloride: 101 mmol/L (ref 98–111)
Creatinine, Ser: 2.5 mg/dL — ABNORMAL HIGH (ref 0.61–1.24)
GFR calc Af Amer: 25 mL/min — ABNORMAL LOW (ref >60)
GFR calc non Af Amer: 22 mL/min — ABNORMAL LOW (ref >60)
Glucose, Bld: 118 mg/dL — ABNORMAL HIGH (ref 70–99)
Potassium: 4 mmol/L (ref 3.5–5.1)
Sodium: 138 mmol/L (ref 135–145)

## 2019-09-16 LAB — CBC WITH DIFFERENTIAL/PLATELET
Abs Immature Granulocytes: 0.04 10*3/uL (ref 0.00–0.07)
Basophils Absolute: 0 10*3/uL (ref 0.0–0.1)
Basophils Relative: 0 %
Eosinophils Absolute: 0.2 10*3/uL (ref 0.0–0.5)
Eosinophils Relative: 2 %
HCT: 31.3 % — ABNORMAL LOW (ref 39.0–52.0)
Hemoglobin: 10.3 g/dL — ABNORMAL LOW (ref 13.0–17.0)
Immature Granulocytes: 0 %
Lymphocytes Relative: 9 %
Lymphs Abs: 0.9 10*3/uL (ref 0.7–4.0)
MCH: 32.3 pg (ref 26.0–34.0)
MCHC: 32.9 g/dL (ref 30.0–36.0)
MCV: 98.1 fL (ref 80.0–100.0)
Monocytes Absolute: 1 10*3/uL (ref 0.1–1.0)
Monocytes Relative: 9 %
Neutro Abs: 8.2 10*3/uL — ABNORMAL HIGH (ref 1.7–7.7)
Neutrophils Relative %: 80 %
Platelets: 186 10*3/uL (ref 150–400)
RBC: 3.19 MIL/uL — ABNORMAL LOW (ref 4.22–5.81)
RDW: 14.6 % (ref 11.5–15.5)
WBC: 10.4 10*3/uL (ref 4.0–10.5)
nRBC: 0 % (ref 0.0–0.2)

## 2019-09-16 NOTE — Discharge Instructions (Signed)
Call Nephrology tomorrow.  Take care   Dr. Lacinda Axon

## 2019-09-16 NOTE — ED Provider Notes (Signed)
[facility]    CSN: [id number] Arrival date & time: 09/16/19  1633      History   Chief Complaint Chief Complaint  Patient presents with  . Cough   HPI  86 year old male presents with the above complaint.  Patient reports that he has not been feeling well recently.  He has recently been hospitalized and was diuresed.  Patient reports ongoing cough.  Patient also reports that he just does not feel well.  He reports thirst and feeling cold.  No fever.  He is not in any pain.  He was encouraged to come in for evaluation by his family members.  No relieving factors.  Patient states that he drinks very little water, less than 8 ounces a day.  He is followed closely by his providers.  No other associated symptoms.  No other complaints.  Past Medical History:  Diagnosis Date  . Arthritis   . Atrial fibrillation ([place])   . CHF (congestive heart failure) ([place])   . Chronic kidney disease    kidney stones  . Coronary artery disease   . Diabetes mellitus without complication ([place])   . Diverticulosis   . GERD (gastroesophageal reflux disease)   . Gout   . Hyperlipidemia   . Hypertension   . Myocardial infarction ([place]) 2001  . Peripheral vascular disease ([place])   . Pneumothorax, left 1950    Patient Active Problem List   Diagnosis Date Noted  . Anemia of chronic disease 09/10/2019  . 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 Acute on chronic combined systolic and diastolic CHF (congestive heart failure) ([place]) 05/23/2019  . Acute pain of left shoulder 09/08/2015  . CKD (chronic kidney disease), stage IV ([place]) 12/24/2014  . Bradycardia 12/02/2014  . TI (tricuspid incompetence) 10/21/2014  . DD (diverticular disease) 07/30/2014  . Essential (primary) hypertension 07/30/2014  . Tuberculosis 07/30/2014  . Hyperlipidemia 07/30/2014  . Chronic systolic  CHF (congestive heart failure), NYHA class 2 ([place]) 02/12/2014  . AF (paroxysmal atrial fibrillation) ([place]) 02/12/2014  . Paroxysmal A-fib ([place]) 02/12/2014  . Accumulation of fluid in tissues 07/24/2013  . Acid reflux 07/24/2013  . Gout 07/24/2013  . Diabetes mellitus, type 2 ([place]) 07/24/2013  . Type 2 diabetes mellitus with renal manifestations, controlled ([place]) 07/24/2013  . Peripheral blood vessel disorder ([place]) 07/19/2013  . PVD (peripheral vascular disease) ([place]) 07/19/2013  . Combined fat and carbohydrate induced hyperlipemia 05/21/2013  . Encounter for therapeutic drug monitoring 12/18/2012  . Chronic kidney disease (CKD), stage IV (severe) ([place]) 12/18/2012  . Arteriosclerosis of coronary artery 12/18/2012  . Decreased potassium in the blood 12/18/2012  . H/O coronary artery bypass surgery 12/18/2012  . H/O total knee replacement 12/18/2012  . Encounter for therapeutic drug level monitoring 12/18/2012    Past Surgical History:  Procedure Laterality Date  . CARDIAC CATHETERIZATION    . CARDIAC SURGERY     4 bypass  . CHOLECYSTECTOMY    . CORONARY ARTERY BYPASS GRAFT    . CYSTOSCOPY W/ RETROGRADES N/A 12/03/2014   Procedure: CYSTOSCOPY WITH ATTEMPT FOR RETROGRADE PYELOGRAM;  Surgeon: [name], MD;  Location: [facility] ORS;  Service: Urology;  Laterality: N/A;  . CYSTOSCOPY WITH LITHOLAPAXY N/A 12/03/2014   Procedure: CYSTOSCOPY WITH LITHOLAPAXY WITH HOLMIUM LASER ;  Surgeon: [name], MD;  Location: [facility] ORS;  Service: Urology;  Laterality: N/A;  . FEMUR  SURGERY    . GALLBLADDER SURGERY    . HERNIA REPAIR    . JOINT REPLACEMENT Right    Total Knee Replacement  . LUNG SURGERY Left   . REPLACEMENT TOTAL KNEE         Home Medications    Prior to Admission medications   Medication Sig Start Date End Date Taking? Authorizing Provider  acetaminophen (TYLENOL) 500 MG tablet Take 500 mg by mouth as needed for mild pain.   Yes [provider]  aspirin  EC 81 MG tablet Take 81 mg by mouth at bedtime.    Yes [provider]  ferrous sulfate 325 (65 FE) MG tablet Take 1 tablet (325 mg total) by mouth daily. 09/12/19 10/12/19 Yes Sharen Hones, MD  loratadine (CLARITIN) 10 MG tablet Take 10 mg by mouth daily as needed. 07/16/19  Yes [provider]  losartan (COZAAR) 25 MG tablet Take 25 mg by mouth daily. 05/10/19  Yes [provider]  metoprolol succinate (TOPROL-XL) 25 MG 24 hr tablet Take 25 mg by mouth daily. 05/10/19  Yes [provider]  Multiple Vitamin (MULTI-VITAMINS) TABS Take 1 tablet by mouth daily.    Yes [provider]  Omega-3 Fatty Acids (FISH OIL) 1000 MG CAPS Take 1 capsule by mouth daily.   Yes [provider]  pantoprazole (PROTONIX) 40 MG tablet Take 40 mg by mouth daily.  04/26/19  Yes [provider]  simvastatin (ZOCOR) 40 MG tablet Take 40 mg by mouth daily at 6 PM.  07/07/14  Yes [provider]  warfarin (COUMADIN) 3 MG tablet Take 3 mg by mouth daily at 6 PM.  05/02/14  Yes [provider]  allopurinol (ZYLOPRIM) 300 MG tablet Take 150 mg by mouth daily.  Patient not taking: Reported on 09/10/2019 05/09/14   [provider]  finasteride (PROSCAR) 5 MG tablet Take 1 tablet (5 mg total) by mouth daily. Patient not taking: Reported on 09/10/2019 08/13/18   Billey Co, MD  tamsulosin (FLOMAX) 0.4 MG CAPS capsule Take 1 capsule (0.4 mg total) by mouth daily. Patient not taking: Reported on 09/10/2019 08/13/18   Billey Co, MD  torsemide (DEMADEX) 20 MG tablet Take 2 tablets (40 mg total) by mouth daily. 05/28/19 09/10/19  Sidney Ace, MD  colchicine 0.6 MG tablet Take 0.6 mg by mouth as needed.   08/28/18  [provider]    Family History Family History  Problem Relation Age of Onset  . Lung cancer Mother   . Tuberculosis Brother     Social History Social History   Tobacco Use  . Smoking status: Former Smoker     Types: Pipe    Quit date: 07/30/2006    Years since quitting: 13.1  . Smokeless tobacco: Never Used  Vaping Use  . Vaping Use: Never used  Substance Use Topics  . Alcohol use: Yes    Alcohol/week: 14.0 standard drinks    Types: 14 Glasses of wine per week    Comment: one glass of wine daily  . Drug use: No     Allergies   Morphine and related   Review of Systems Review of Systems  Constitutional: Positive for fatigue.  Respiratory: Positive for cough.   Endocrine:       Thirst.   Physical Exam Triage Vital Signs ED Triage Vitals  Enc Vitals Group     BP 09/16/19 1649 114/62     Pulse Rate 09/16/19 1649 83  Resp 09/16/19 1649 18     Temp 09/16/19 1649 98.9 F (37.2 C)     Temp Source 09/16/19 1649 Oral     SpO2 09/16/19 1649 95 %     Weight 09/16/19 1648 204 lb (92.5 kg)     Height 09/16/19 1648 5\' 7"  (1.702 m)     Head Circumference --      Peak Flow --      Pain Score 09/16/19 1646 0     Pain Loc --      Pain Edu? --      Excl. in [place]? --    Updated Vital Signs BP 114/62 (BP Location: Right Arm)   Pulse 83   Temp 98.9 F (37.2 C) (Oral)   Resp 18   Ht 5\' 7"  (1.702 m)   Wt 92.5 kg   SpO2 95%   BMI 31.95 kg/m   Visual Acuity Right Eye Distance:   Left Eye Distance:   Bilateral Distance:    Right Eye Near:   Left Eye Near:    Bilateral Near:     Physical Exam Vitals and nursing note reviewed.  Constitutional:      General: He is not in acute distress.    Appearance: Normal appearance. He is not ill-appearing.  HENT:     Head: Normocephalic and atraumatic.  Eyes:     General:        Right eye: No discharge.     Conjunctiva/sclera: Conjunctivae normal.  Cardiovascular:     Rate and Rhythm: Normal rate and regular rhythm.     Comments: Bilateral 2+ pitting lower extremity edema. Pulmonary:     Effort: Pulmonary effort is normal.     Breath sounds: Normal breath sounds. No wheezing or rales.  Neurological:     Mental Status: He is alert.   Psychiatric:        Mood and Affect: Mood normal.        Behavior: Behavior normal.    UC Treatments / Results  Labs (all labs ordered are listed, but only abnormal results are displayed) Labs Reviewed  CBC WITH DIFFERENTIAL/PLATELET - Abnormal; Notable for the following components:      Result Value   RBC 3.19 (*)    Hemoglobin 10.3 (*)    HCT 31.3 (*)    Neutro Abs 8.2 (*)    All other components within normal limits  BASIC METABOLIC PANEL - Abnormal; Notable for the following components:   Glucose, Bld 118 (*)    BUN 54 (*)    Creatinine, Ser 2.50 (*)    GFR calc non Af Amer 22 (*)    GFR calc Af Amer 25 (*)    All other components within normal limits    EKG   Radiology No results found.  Procedures Procedures (including critical care time)  Medications Ordered in UC Medications - No data to display  Initial Impression / Assessment and Plan / UC Course  I have reviewed the triage vital signs and the nursing notes.  Pertinent labs & imaging results that were available during my care of the patient were reviewed by me and considered in my medical decision making (see chart for details).    86 year old male presents with multiple complaints.  He has recently been hospitalized.  Benign exam.  Laboratory studies obtained and revealed hemoglobin at baseline.  Patient did have a bump in his creatinine to 2.5.  He is followed closely by nephrology.  Advised to increase his  fluid intake slightly and to follow-up closely with nephrology.  Final Clinical Impressions(s) / UC Diagnoses   Final diagnoses:  Cough  Fatigue, unspecified type  AKI (acute kidney injury) [facility])     Discharge Instructions     Call Nephrology tomorrow.  Take care   Dr. Lacinda Axon    ED Prescriptions    None     PDMP not reviewed this encounter.   Coral Spikes, [place] 09/16/19 2029

## 2019-09-16 NOTE — ED Triage Notes (Addendum)
Patient here today for cough for several months and feeling very cold. He states he just got out of the hospital for LE edema. He was advised to go to the ER but states he would come to urgent care to be evaluated.

## 2019-09-27 ENCOUNTER — Ambulatory Visit: Payer: Medicare Other | Attending: Family | Admitting: Family

## 2019-09-27 ENCOUNTER — Encounter: Payer: Self-pay | Admitting: Family

## 2019-09-27 ENCOUNTER — Other Ambulatory Visit: Payer: Self-pay

## 2019-09-27 VITALS — BP 115/75 | HR 46 | Resp 18 | Ht 67.0 in | Wt 204.0 lb

## 2019-09-27 DIAGNOSIS — I252 Old myocardial infarction: Secondary | ICD-10-CM | POA: Diagnosis not present

## 2019-09-27 DIAGNOSIS — Z96651 Presence of right artificial knee joint: Secondary | ICD-10-CM | POA: Insufficient documentation

## 2019-09-27 DIAGNOSIS — Z79899 Other long term (current) drug therapy: Secondary | ICD-10-CM | POA: Diagnosis not present

## 2019-09-27 DIAGNOSIS — I130 Hypertensive heart and chronic kidney disease with heart failure and stage 1 through stage 4 chronic kidney disease, or unspecified chronic kidney disease: Secondary | ICD-10-CM | POA: Diagnosis not present

## 2019-09-27 DIAGNOSIS — N189 Chronic kidney disease, unspecified: Secondary | ICD-10-CM | POA: Insufficient documentation

## 2019-09-27 DIAGNOSIS — I5032 Chronic diastolic (congestive) heart failure: Secondary | ICD-10-CM | POA: Diagnosis not present

## 2019-09-27 DIAGNOSIS — E785 Hyperlipidemia, unspecified: Secondary | ICD-10-CM | POA: Insufficient documentation

## 2019-09-27 DIAGNOSIS — E1122 Type 2 diabetes mellitus with diabetic chronic kidney disease: Secondary | ICD-10-CM | POA: Diagnosis not present

## 2019-09-27 DIAGNOSIS — R0602 Shortness of breath: Secondary | ICD-10-CM | POA: Insufficient documentation

## 2019-09-27 DIAGNOSIS — Z951 Presence of aortocoronary bypass graft: Secondary | ICD-10-CM | POA: Insufficient documentation

## 2019-09-27 DIAGNOSIS — K219 Gastro-esophageal reflux disease without esophagitis: Secondary | ICD-10-CM | POA: Insufficient documentation

## 2019-09-27 DIAGNOSIS — M109 Gout, unspecified: Secondary | ICD-10-CM | POA: Insufficient documentation

## 2019-09-27 DIAGNOSIS — M1990 Unspecified osteoarthritis, unspecified site: Secondary | ICD-10-CM | POA: Diagnosis not present

## 2019-09-27 DIAGNOSIS — E119 Type 2 diabetes mellitus without complications: Secondary | ICD-10-CM | POA: Insufficient documentation

## 2019-09-27 DIAGNOSIS — I739 Peripheral vascular disease, unspecified: Secondary | ICD-10-CM | POA: Insufficient documentation

## 2019-09-27 DIAGNOSIS — Z885 Allergy status to narcotic agent status: Secondary | ICD-10-CM | POA: Insufficient documentation

## 2019-09-27 DIAGNOSIS — R5383 Other fatigue: Secondary | ICD-10-CM | POA: Diagnosis not present

## 2019-09-27 DIAGNOSIS — Z9049 Acquired absence of other specified parts of digestive tract: Secondary | ICD-10-CM | POA: Diagnosis not present

## 2019-09-27 DIAGNOSIS — I10 Essential (primary) hypertension: Secondary | ICD-10-CM

## 2019-09-27 DIAGNOSIS — Z7982 Long term (current) use of aspirin: Secondary | ICD-10-CM | POA: Diagnosis not present

## 2019-09-27 DIAGNOSIS — Z8611 Personal history of tuberculosis: Secondary | ICD-10-CM | POA: Diagnosis not present

## 2019-09-27 DIAGNOSIS — N184 Chronic kidney disease, stage 4 (severe): Secondary | ICD-10-CM

## 2019-09-27 DIAGNOSIS — Z87891 Personal history of nicotine dependence: Secondary | ICD-10-CM | POA: Diagnosis not present

## 2019-09-27 DIAGNOSIS — Z7901 Long term (current) use of anticoagulants: Secondary | ICD-10-CM | POA: Insufficient documentation

## 2019-09-27 DIAGNOSIS — Z87442 Personal history of urinary calculi: Secondary | ICD-10-CM | POA: Insufficient documentation

## 2019-09-27 DIAGNOSIS — G479 Sleep disorder, unspecified: Secondary | ICD-10-CM | POA: Insufficient documentation

## 2019-09-27 DIAGNOSIS — I2510 Atherosclerotic heart disease of native coronary artery without angina pectoris: Secondary | ICD-10-CM | POA: Insufficient documentation

## 2019-09-27 NOTE — Progress Notes (Signed)
Patient ID: Carlos [name], male    DOB: 12/30/1926, 86 y.o.   MRN: [id number]  HPI  Carlos [name] is a 86 y/o male with a history of CAD, DM, hyperlipidemia, HTN, CKD, PVD, GERD, previous TB, atrial fibrillation, previous tobacco use and chronic heart failure.   Echo report from 05/24/19 reviewed and showed an EF of 60-65% along with mild Carlos.   Was in the ED 09/16/19 due to a cough. Evaluated and released. Admitted 09/10/19 due to bilateral pedal edema. Initially given IV lasix with transition to oral diuretics. Legs wrapped in ACE bandages. Discharged after 2 days. Was in the ED 09/05/19 due to leg edema. CXR negative for pulmonary edema. BNP slightly elevated. Increased oral diuretic and he was released.    He presents today for his initial visit with a chief complaint of minimal shortness of breath upon moderate exertion. He describes this as chronic in nature having been present for several years. He has associated fatigue, pedal edema (improving), infrequent palpitations, chronic difficulty sleeping and easy bruising along with this. He denies any dizziness, abdominal distention, chest pain, cough or weight gain.   Past Medical History:  Diagnosis Date  . Arthritis   . Atrial fibrillation ([place])   . CHF (congestive heart failure) ([place])   . Chronic kidney disease    kidney stones  . Coronary artery disease   . Diabetes mellitus without complication ([place])   . Diverticulosis   . GERD (gastroesophageal reflux disease)   . Gout   . Hyperlipidemia   . Hypertension   . Myocardial infarction ([place]) 2001  . Peripheral vascular disease ([place])   . Pneumothorax, left 1950   Past Surgical History:  Procedure Laterality Date  . CARDIAC CATHETERIZATION    . CARDIAC SURGERY     4 bypass  . CHOLECYSTECTOMY    . CORONARY ARTERY BYPASS GRAFT    . CYSTOSCOPY W/ RETROGRADES N/A 12/03/2014   Procedure: CYSTOSCOPY WITH ATTEMPT FOR RETROGRADE PYELOGRAM;  Surgeon: [name], MD;  Location: [facility] ORS;   Service: Urology;  Laterality: N/A;  . CYSTOSCOPY WITH LITHOLAPAXY N/A 12/03/2014   Procedure: CYSTOSCOPY WITH LITHOLAPAXY WITH HOLMIUM LASER ;  Surgeon: [name], MD;  Location: [facility] ORS;  Service: Urology;  Laterality: N/A;  . FEMUR SURGERY    . GALLBLADDER SURGERY    . HERNIA REPAIR    . JOINT REPLACEMENT Right    Total Knee Replacement  . LUNG SURGERY Left   . REPLACEMENT TOTAL KNEE     Family History  Problem Relation Age of Onset  . Lung cancer Mother   . Tuberculosis Brother    Social History   Tobacco Use  . Smoking status: Former Smoker    Types: Pipe    Quit date: 07/30/2006    Years since quitting: 13.1  . Smokeless tobacco: Never Used  Substance Use Topics  . Alcohol use: Yes    Alcohol/week: 14.0 standard drinks    Types: 14 Glasses of wine per week    Comment: one glass of wine daily   Allergies  Allergen Reactions  . Morphine And Related Other (See Comments)    "nightmares" (Patient had Morphine and tolerated it fine)    Prior to Admission medications   Medication Sig Start Date End Date Taking? Authorizing Provider  acetaminophen (TYLENOL) 500 MG tablet Take 500 mg by mouth as needed for mild pain.   Yes [provider]  allopurinol (ZYLOPRIM) 300 MG tablet Take 150 mg by  mouth daily.  05/09/14  Yes [provider]  aspirin EC 81 MG tablet Take 81 mg by mouth at bedtime.    Yes [provider]  ferrous sulfate 325 (65 FE) MG tablet Take 1 tablet (325 mg total) by mouth daily. 09/12/19 10/12/19 Yes Sharen Hones, MD  finasteride (PROSCAR) 5 MG tablet Take 1 tablet (5 mg total) by mouth daily. 08/13/18  Yes Billey Co, MD  loratadine (CLARITIN) 10 MG tablet Take 10 mg by mouth daily as needed. 07/16/19  Yes [provider]  losartan (COZAAR) 25 MG tablet Take 25 mg by mouth daily. 05/10/19  Yes [provider]  metoprolol succinate (TOPROL-XL) 25 MG 24 hr tablet Take 25 mg by mouth daily. 05/10/19  Yes  [provider]  Multiple Vitamin (MULTI-VITAMINS) TABS Take 1 tablet by mouth daily.    Yes [provider]  Omega-3 Fatty Acids (FISH OIL) 1000 MG CAPS Take 1 capsule by mouth daily.   Yes [provider]  pantoprazole (PROTONIX) 40 MG tablet Take 40 mg by mouth daily.  04/26/19  Yes [provider]  simvastatin (ZOCOR) 40 MG tablet Take 40 mg by mouth daily at 6 PM.  07/07/14  Yes [provider]  tamsulosin (FLOMAX) 0.4 MG CAPS capsule Take 1 capsule (0.4 mg total) by mouth daily. 08/13/18  Yes Billey Co, MD  torsemide (DEMADEX) 20 MG tablet Take 2 tablets (40 mg total) by mouth daily. 05/28/19 09/27/19 Yes Sreenath, Sudheer B, MD  warfarin (COUMADIN) 3 MG tablet Take 3 mg by mouth daily at 6 PM.  05/02/14  Yes [provider]  colchicine 0.6 MG tablet Take 0.6 mg by mouth as needed.   08/28/18  [provider]    Review of Systems  Constitutional: Positive for fatigue. Negative for appetite change.  HENT: Negative for congestion, postnasal drip and sore throat.   Eyes: Negative.   Respiratory: Positive for shortness of breath (with moderate exertion). Negative for cough.   Cardiovascular: Positive for palpitations (infrequent) and leg swelling (R>L). Negative for chest pain.  Gastrointestinal: Negative for abdominal distention and abdominal pain.  Endocrine: Negative.   Genitourinary: Negative.   Musculoskeletal: Negative for arthralgias and back pain.  Skin: Negative.   Allergic/Immunologic: Negative.   Neurological: Negative for dizziness and light-headedness.  Hematological: Negative for adenopathy. Bruises/bleeds easily.  Psychiatric/Behavioral: Positive for sleep disturbance (chronic difficulty sleeping). Negative for dysphoric mood. The patient is not nervous/anxious.     Vitals:   09/27/19 1359  BP: 115/75  Pulse: 46  Resp: 18  SpO2: 92%  Weight: (!) 204 lb (92.5 kg)  Height: 5\' 7"  (1.702 m)   Wt Readings  from Last 3 Encounters:  09/27/19 (!) 204 lb (92.5 kg)  09/16/19 204 lb (92.5 kg)  09/12/19 204 lb 3.2 oz (92.6 kg)   Lab Results  Component Value Date   CREATININE 2.50 (H) 09/16/2019   CREATININE 1.98 (H) 09/12/2019   CREATININE 2.08 (H) 09/11/2019    Physical Exam Vitals and nursing note reviewed.  Constitutional:      Appearance: Normal appearance.  HENT:     Head: Normocephalic and atraumatic.  Cardiovascular:     Rate and Rhythm: Regular rhythm. Bradycardia present.  Pulmonary:     Effort: Pulmonary effort is normal. No respiratory distress.     Breath sounds: No wheezing or rales.  Abdominal:     General: There is no distension.     Palpations: Abdomen is soft.  Musculoskeletal:        General: No tenderness.     Cervical back: Normal range of motion and neck supple.     Right lower leg: Edema (1+ pitting) present.     Left lower leg: Edema (1+ pitting) present.  Skin:    General: Skin is warm and dry.  Neurological:     General: No focal deficit present.     Mental Status: He is alert and oriented to person, place, and time.  Psychiatric:        Mood and Affect: Mood normal.        Behavior: Behavior normal.        Thought Content: Thought content normal.       Assessment & Plan:  1: Chronic heart failure with preserved ejection fraction without structural changes- - NYHA class II - euvolemic today - not weighing daily but does have scales; instructed to call for an overnight weight gain of >2 pounds or a weekly weight gain of >5 pounds - not adding salt and tries to follow a low sodium diet and written dietary information was given to him (he had been eating [employer] for lunch daily prior to a hospital stay) - saw cardiology Carlos [name]) 07/10/19 - does wear compression socks daily with removal at bedtime; sleeps with his legs elevated but admits that he doesn't elevate them much during the day - BNP 09/05/19 was 183.0 - has received both his COVID  vaccines  2: HTN- - BP looks good today - saw PCP (Carlos [name]) 08/05/19 - BMP 09/16/19 reviewed and showed sodium 138, potassium 4.0, creatinine 2.5 and GFR 22  3: DM- - A1c on 05/23/19 was 6.0% - has NP through the insurance company that comes ~ monthly  4: CKD- - saw nephrology Carlos [name]) 08/29/19   Patient did not bring his medications nor a list. Each medication was verbally reviewed with the patient and he was encouraged to bring the bottles to every visit to confirm accuracy of list.  Return in 2 months or sooner for any questions/problems before then.

## 2019-09-27 NOTE — Patient Instructions (Signed)
Continue weighing daily and call for an overnight weight gain of > 2 pounds or a weekly weight gain of >5 pounds. 

## 2019-10-14 ENCOUNTER — Encounter (INDEPENDENT_AMBULATORY_CARE_PROVIDER_SITE_OTHER): Payer: Self-pay | Admitting: Vascular Surgery

## 2019-11-13 ENCOUNTER — Other Ambulatory Visit (HOSPITAL_COMMUNITY): Payer: Self-pay | Admitting: Cardiology

## 2019-11-13 ENCOUNTER — Other Ambulatory Visit: Payer: Self-pay | Admitting: Cardiology

## 2019-11-13 ENCOUNTER — Other Ambulatory Visit: Payer: Self-pay

## 2019-11-13 ENCOUNTER — Ambulatory Visit
Admission: RE | Admit: 2019-11-13 | Discharge: 2019-11-13 | Disposition: A | Discharge status: Home / Self Care | Payer: Medicare Other | Source: Ambulatory Visit | Attending: Cardiology | Admitting: Cardiology

## 2019-11-13 DIAGNOSIS — M7989 Other specified soft tissue disorders: Secondary | ICD-10-CM | POA: Diagnosis not present

## 2019-11-27 ENCOUNTER — Encounter: Payer: Self-pay | Admitting: Family

## 2019-11-27 ENCOUNTER — Ambulatory Visit: Payer: Medicare Other | Admitting: Family

## 2019-11-27 ENCOUNTER — Ambulatory Visit
Admission: RE | Admit: 2019-11-27 | Discharge: 2019-11-27 | Disposition: A | Discharge status: Home / Self Care | Payer: Medicare Other | Source: Ambulatory Visit | Attending: Family | Admitting: Family

## 2019-11-27 ENCOUNTER — Other Ambulatory Visit: Payer: Self-pay | Admitting: Family

## 2019-11-27 ENCOUNTER — Other Ambulatory Visit: Payer: Self-pay

## 2019-11-27 VITALS — BP 119/49 | HR 102 | Resp 16 | Ht 67.0 in | Wt 215.1 lb

## 2019-11-27 DIAGNOSIS — E1151 Type 2 diabetes mellitus with diabetic peripheral angiopathy without gangrene: Secondary | ICD-10-CM | POA: Insufficient documentation

## 2019-11-27 DIAGNOSIS — I2510 Atherosclerotic heart disease of native coronary artery without angina pectoris: Secondary | ICD-10-CM | POA: Diagnosis not present

## 2019-11-27 DIAGNOSIS — I5043 Acute on chronic combined systolic (congestive) and diastolic (congestive) heart failure: Secondary | ICD-10-CM

## 2019-11-27 DIAGNOSIS — Z7901 Long term (current) use of anticoagulants: Secondary | ICD-10-CM | POA: Diagnosis not present

## 2019-11-27 DIAGNOSIS — R635 Abnormal weight gain: Secondary | ICD-10-CM | POA: Diagnosis not present

## 2019-11-27 DIAGNOSIS — Z7982 Long term (current) use of aspirin: Secondary | ICD-10-CM | POA: Diagnosis not present

## 2019-11-27 DIAGNOSIS — I10 Essential (primary) hypertension: Secondary | ICD-10-CM

## 2019-11-27 DIAGNOSIS — Z951 Presence of aortocoronary bypass graft: Secondary | ICD-10-CM | POA: Insufficient documentation

## 2019-11-27 DIAGNOSIS — M1990 Unspecified osteoarthritis, unspecified site: Secondary | ICD-10-CM | POA: Diagnosis not present

## 2019-11-27 DIAGNOSIS — G479 Sleep disorder, unspecified: Secondary | ICD-10-CM | POA: Diagnosis not present

## 2019-11-27 DIAGNOSIS — E1122 Type 2 diabetes mellitus with diabetic chronic kidney disease: Secondary | ICD-10-CM | POA: Insufficient documentation

## 2019-11-27 DIAGNOSIS — I739 Peripheral vascular disease, unspecified: Secondary | ICD-10-CM | POA: Diagnosis not present

## 2019-11-27 DIAGNOSIS — I5033 Acute on chronic diastolic (congestive) heart failure: Secondary | ICD-10-CM | POA: Insufficient documentation

## 2019-11-27 DIAGNOSIS — Z87891 Personal history of nicotine dependence: Secondary | ICD-10-CM | POA: Insufficient documentation

## 2019-11-27 DIAGNOSIS — I252 Old myocardial infarction: Secondary | ICD-10-CM | POA: Diagnosis not present

## 2019-11-27 DIAGNOSIS — N189 Chronic kidney disease, unspecified: Secondary | ICD-10-CM | POA: Insufficient documentation

## 2019-11-27 DIAGNOSIS — N184 Chronic kidney disease, stage 4 (severe): Secondary | ICD-10-CM

## 2019-11-27 DIAGNOSIS — E785 Hyperlipidemia, unspecified: Secondary | ICD-10-CM | POA: Insufficient documentation

## 2019-11-27 DIAGNOSIS — M7989 Other specified soft tissue disorders: Secondary | ICD-10-CM | POA: Insufficient documentation

## 2019-11-27 DIAGNOSIS — R609 Edema, unspecified: Secondary | ICD-10-CM | POA: Diagnosis not present

## 2019-11-27 DIAGNOSIS — M109 Gout, unspecified: Secondary | ICD-10-CM | POA: Diagnosis not present

## 2019-11-27 DIAGNOSIS — R0602 Shortness of breath: Secondary | ICD-10-CM | POA: Diagnosis present

## 2019-11-27 DIAGNOSIS — I4891 Unspecified atrial fibrillation: Secondary | ICD-10-CM | POA: Insufficient documentation

## 2019-11-27 DIAGNOSIS — Z79899 Other long term (current) drug therapy: Secondary | ICD-10-CM | POA: Insufficient documentation

## 2019-11-27 DIAGNOSIS — R5383 Other fatigue: Secondary | ICD-10-CM | POA: Diagnosis not present

## 2019-11-27 DIAGNOSIS — I130 Hypertensive heart and chronic kidney disease with heart failure and stage 1 through stage 4 chronic kidney disease, or unspecified chronic kidney disease: Secondary | ICD-10-CM | POA: Insufficient documentation

## 2019-11-27 LAB — BASIC METABOLIC PANEL
Anion gap: 9 (ref 5–15)
BUN: 66 mg/dL — ABNORMAL HIGH (ref 8–23)
CO2: 29 mmol/L (ref 22–32)
Calcium: 8.9 mg/dL (ref 8.9–10.3)
Chloride: 101 mmol/L (ref 98–111)
Creatinine, Ser: 2.26 mg/dL — ABNORMAL HIGH (ref 0.61–1.24)
GFR calc Af Amer: 28 mL/min — ABNORMAL LOW (ref >60)
GFR calc non Af Amer: 24 mL/min — ABNORMAL LOW (ref >60)
Glucose, Bld: 99 mg/dL (ref 70–99)
Potassium: 3.9 mmol/L (ref 3.5–5.1)
Sodium: 139 mmol/L (ref 135–145)

## 2019-11-27 LAB — BRAIN NATRIURETIC PEPTIDE: B Natriuretic Peptide: 196.8 pg/mL — ABNORMAL HIGH (ref 0.0–100.0)

## 2019-11-27 MED ORDER — FUROSEMIDE 10 MG/ML IJ SOLN
INTRAMUSCULAR | Status: AC
  Administered 2019-11-27: 80 mg via INTRAVENOUS
  Filled 2019-11-27: qty 8

## 2019-11-27 MED ORDER — POTASSIUM CHLORIDE CRYS ER 20 MEQ PO TBCR
EXTENDED_RELEASE_TABLET | ORAL | Status: AC
  Administered 2019-11-27: 40 meq via ORAL
  Filled 2019-11-27: qty 2

## 2019-11-27 MED ORDER — POTASSIUM CHLORIDE CRYS ER 20 MEQ PO TBCR: 40.0000 meq | EXTENDED_RELEASE_TABLET | Freq: Once | ORAL | Status: AC

## 2019-11-27 MED ORDER — FUROSEMIDE 10 MG/ML IJ SOLN: 80.0000 mg | Freq: Once | INTRAMUSCULAR | Status: AC

## 2019-11-27 NOTE — Progress Notes (Signed)
Patient ID: Carlos [name], male    DOB: Mar 03, 1926, 86 y.o.   MRN: [id number]  Mr Terriquez is a 86 y/o male with a history of CAD, DM, hyperlipidemia, HTN, CKD, PVD, GERD, previous TB, atrial fibrillation, previous tobacco use and chronic heart failure.   Echo report from 05/24/19 reviewed and showed an EF of 60-65% along with mild MR.   Was in the ED 09/16/19 due to a cough. Evaluated and released. Admitted 09/10/19 due to bilateral pedal edema. Initially given IV lasix with transition to oral diuretics. Legs wrapped in ACE bandages. Discharged after 2 days. Was in the ED 09/05/19 due to leg edema. CXR negative for pulmonary edema. BNP slightly elevated. Increased oral diuretic and he was released.    He presents today for a follow-up visit with a chief complaint of difficulty sleeping, easy bruising, pedal edema, shortness of breath, fatigue and weight gain along with this. He denies any dizziness, cough, chest pain, palpitations, abdominal distention or anxiety.   Past Medical History:  Diagnosis Date  . Arthritis   . Atrial fibrillation ([place])   . CHF (congestive heart failure) ([place])   . Chronic kidney disease    kidney stones  . Coronary artery disease   . Diabetes mellitus without complication ([place])   . Diverticulosis   . GERD (gastroesophageal reflux disease)   . Gout   . Hyperlipidemia   . Hypertension   . Myocardial infarction ([place]) 2001  . Peripheral vascular disease ([place])   . Pneumothorax, left 1950   Past Surgical History:  Procedure Laterality Date  . CARDIAC CATHETERIZATION    . CARDIAC SURGERY     4 bypass  . CHOLECYSTECTOMY    . CORONARY ARTERY BYPASS GRAFT    . CYSTOSCOPY W/ RETROGRADES N/A 12/03/2014   Procedure: CYSTOSCOPY WITH ATTEMPT FOR RETROGRADE PYELOGRAM;  Surgeon: [name], MD;  Location: [facility] ORS;  Service: Urology;  Laterality: N/A;  . CYSTOSCOPY WITH LITHOLAPAXY N/A 12/03/2014   Procedure: CYSTOSCOPY WITH LITHOLAPAXY WITH HOLMIUM LASER ;  Surgeon: [name], MD;  Location: [facility] ORS;  Service: Urology;  Laterality: N/A;  . FEMUR SURGERY    . GALLBLADDER SURGERY    . HERNIA REPAIR    . JOINT REPLACEMENT Right    Total Knee Replacement  . LUNG SURGERY Left   . REPLACEMENT TOTAL KNEE     Family History  Problem Relation Age of Onset  . Lung cancer Mother   . Tuberculosis Brother    Social History   Tobacco Use  . Smoking status: Former Smoker    Types: Pipe    Quit date: 07/30/2006    Years since quitting: 13.3  . Smokeless tobacco: Never Used  Substance Use Topics  . Alcohol use: Yes    Alcohol/week: 14.0 standard drinks    Types: 14 Glasses of wine per week    Comment: one glass of wine daily   Allergies  Allergen Reactions  . Morphine And Related Other (See Comments)    "nightmares" (Patient had Morphine and tolerated it fine)    Prior to Admission medications   Medication Sig Start Date End Date Taking? Authorizing Provider  acetaminophen (TYLENOL) 500 MG tablet Take 500 mg by mouth as needed for mild pain.   Yes [provider]  allopurinol (ZYLOPRIM) 300 MG tablet Take 150 mg by mouth daily.  05/09/14  Yes [provider]  aspirin EC 81 MG tablet Take 81 mg by mouth at bedtime.  Yes [provider]  ferrous sulfate 325 (65 FE) MG tablet Take 1 tablet (325 mg total) by mouth daily. 09/12/19 11/27/19 Yes Sharen Hones, MD  finasteride (PROSCAR) 5 MG tablet Take 1 tablet (5 mg total) by mouth daily. 08/13/18  Yes Billey Co, MD  loratadine (CLARITIN) 10 MG tablet Take 10 mg by mouth daily as needed. 07/16/19  Yes [provider]  losartan (COZAAR) 25 MG tablet Take 25 mg by mouth daily. 05/10/19  Yes [provider]  metoprolol succinate (TOPROL-XL) 25 MG 24 hr tablet Take 25 mg by mouth daily. 05/10/19  Yes [provider]  Multiple Vitamin (MULTI-VITAMINS) TABS Take 1 tablet by mouth daily.    Yes [provider]  Omega-3 Fatty Acids (FISH OIL) 1000 MG  CAPS Take 1 capsule by mouth daily.   Yes [provider]  simvastatin (ZOCOR) 40 MG tablet Take 40 mg by mouth daily at 6 PM.  07/07/14  Yes [provider]  tamsulosin (FLOMAX) 0.4 MG CAPS capsule Take 1 capsule (0.4 mg total) by mouth daily. 08/13/18  Yes Billey Co, MD  torsemide (DEMADEX) 20 MG tablet Take 2 tablets (40 mg total) by mouth daily. 05/28/19 11/27/19 Yes Sreenath, Sudheer B, MD  warfarin (COUMADIN) 3 MG tablet Take 3 mg by mouth daily at 6 PM.  05/02/14  Yes [provider]  colchicine 0.6 MG tablet Take 0.6 mg by mouth as needed.   08/28/18  [provider]     Review of Systems  Constitutional: Positive for fatigue. Negative for activity change and appetite change.  HENT: Negative for congestion, postnasal drip and sore throat.   Eyes: Negative.   Respiratory: Positive for shortness of breath (with moderate exertion). Negative for cough, chest tightness and wheezing.   Cardiovascular: Positive for leg swelling (R>L). Negative for chest pain and palpitations.  Gastrointestinal: Negative for abdominal distention and abdominal pain.  Endocrine: Negative.   Genitourinary: Negative.   Musculoskeletal: Negative for arthralgias and back pain.  Skin: Negative.   Allergic/Immunologic: Negative.   Neurological: Negative for dizziness, syncope, weakness, light-headedness and headaches.  Hematological: Negative for adenopathy. Bruises/bleeds easily.  Psychiatric/Behavioral: Positive for sleep disturbance (chronic difficulty sleeping). Negative for dysphoric mood. The patient is not nervous/anxious.     Vitals:   11/27/19 1255  BP: (!) 119/49  Pulse: (!) 102  Resp: 16  SpO2: 96%  Weight: 215 lb 2 oz (97.6 kg)  Height: 5\' 7"  (1.702 m)   Wt Readings from Last 3 Encounters:  11/27/19 215 lb 2 oz (97.6 kg)  09/27/19 (!) 204 lb (92.5 kg)  09/16/19 204 lb (92.5 kg)   Lab Results  Component Value Date   CREATININE 2.50 (H) 09/16/2019    CREATININE 1.98 (H) 09/12/2019   CREATININE 2.08 (H) 09/11/2019    Physical Exam Vitals and nursing note reviewed.  Constitutional:      General: He is not in acute distress.    Appearance: Normal appearance. He is not ill-appearing, toxic-appearing or diaphoretic.  HENT:     Head: Normocephalic and atraumatic.  Cardiovascular:     Rate and Rhythm: Regular rhythm. Tachycardia present.  Pulmonary:     Effort: Pulmonary effort is normal. No respiratory distress.     Breath sounds: No wheezing or rales.  Abdominal:     General: There is no distension.     Palpations: Abdomen is soft.  Musculoskeletal:        General: No tenderness.  Cervical back: Normal range of motion and neck supple.     Right lower leg: Edema (3+ pitting) present.     Left lower leg: Edema (3+ pitting) present.  Skin:    General: Skin is warm and dry.  Neurological:     General: No focal deficit present.     Mental Status: He is alert and oriented to person, place, and time.  Psychiatric:        Mood and Affect: Mood normal.        Behavior: Behavior normal.        Thought Content: Thought content normal.    Assessment & Plan:  1: Acute on Chronic heart failure with preserved ejection fraction without structural changes- - NYHA class II - Obvious fluid overload in legs with +3 pitting edema - weighing daily; reminded to call for an overnight weight gain of >2 pounds or a weekly weight gain of >5 pounds - weight up 11 pounds since last visit here - not adding salt and tries to follow a low sodium diet. Reports he eats sausage and eggs every morning and has a beer or glass of wine for dinner. - saw cardiology Zenia Resides) 11/13/19 - does wear compression socks daily with removal at bedtime; sleeps with his legs elevated but admits that he doesn't elevate them much during the day - BNP 09/05/19 was 183.0 - has received both his COVID vaccines -Due to patient's fluid overload, patient sent to same day surgery  for 80mg  IV lasix/ 38meq PO potassium.   2: HTN- - BP looks good today - saw PCP (Feldpausch) 10/16/19 - BMP 10/28/2019 reviewed and showed sodium 140, potassium 4.1, creatinine 1.9 and GFR 30  3: DM- - A1c on 05/23/19 was 6.0%  4: CKD- - saw nephrology Holley Raring) 10/28/19   Patient did not bring his medications nor a list. Each medication was verbally reviewed with the patient and he was encouraged to bring the bottles to every visit to confirm accuracy of list.  Return in 2 days for a re-check as patient was sent today for same day surgery to receive IV lasix.

## 2019-11-27 NOTE — Patient Instructions (Signed)
Continue weighing daily and call for an overnight weight gain of > 2 pounds or a weekly weight gain of >5 pounds. 

## 2019-11-28 NOTE — Progress Notes (Signed)
Patient ID: Carlos [name], male    DOB: 09-Jan-1927, 86 y.o.   MRN: [id number]  Carlos [name] is a 86 y/o male with a history of CAD, DM, hyperlipidemia, HTN, CKD, PVD, GERD, previous TB, atrial fibrillation, previous tobacco use and chronic heart failure.   Echo report from 05/24/19 reviewed and showed an EF of 60-65% along with mild Carlos.   Was in the ED 09/16/19 due to a cough. Evaluated and released. Admitted 09/10/19 due to bilateral pedal edema. Initially given IV lasix with transition to oral diuretics. Legs wrapped in ACE bandages. Discharged after 2 days. Was in the ED 09/05/19 due to leg edema. CXR negative for pulmonary edema. BNP slightly elevated. Increased oral diuretic and he was released.    He presents today for a follow-up visit with a chief complaint of minimal shortness of breath upon moderate exertion. He describes this as chronic in nature having been present for several years. He has associated fatigue, left eye redness, pedal edema (improving) and chronic difficulty sleeping along with this. He denies any dizziness, abdominal distention, palpitations, chest pain, wheezing, cough or weight gain.   Received 80mg  IV lasix/ 35meq PO potassium 2 days ago with resultant weight loss and improving edema.   Past Medical History:  Diagnosis Date  . Arthritis   . Atrial fibrillation ([place])   . CHF (congestive heart failure) ([place])   . Chronic kidney disease    kidney stones  . Coronary artery disease   . Diabetes mellitus without complication ([place])   . Diverticulosis   . GERD (gastroesophageal reflux disease)   . Gout   . Hyperlipidemia   . Hypertension   . Myocardial infarction ([place]) 2001  . Peripheral vascular disease ([place])   . Pneumothorax, left 1950   Past Surgical History:  Procedure Laterality Date  . CARDIAC CATHETERIZATION    . CARDIAC SURGERY     4 bypass  . CHOLECYSTECTOMY    . CORONARY ARTERY BYPASS GRAFT    . CYSTOSCOPY W/ RETROGRADES N/A 12/03/2014   Procedure:  CYSTOSCOPY WITH ATTEMPT FOR RETROGRADE PYELOGRAM;  Surgeon: [name], MD;  Location: [facility] ORS;  Service: Urology;  Laterality: N/A;  . CYSTOSCOPY WITH LITHOLAPAXY N/A 12/03/2014   Procedure: CYSTOSCOPY WITH LITHOLAPAXY WITH HOLMIUM LASER ;  Surgeon: [name], MD;  Location: [facility] ORS;  Service: Urology;  Laterality: N/A;  . FEMUR SURGERY    . GALLBLADDER SURGERY    . HERNIA REPAIR    . JOINT REPLACEMENT Right    Total Knee Replacement  . LUNG SURGERY Left   . REPLACEMENT TOTAL KNEE     Family History  Problem Relation Age of Onset  . Lung cancer Mother   . Tuberculosis Brother    Social History   Tobacco Use  . Smoking status: Former Smoker    Types: Pipe    Quit date: 07/30/2006    Years since quitting: 13.3  . Smokeless tobacco: Never Used  Substance Use Topics  . Alcohol use: Yes    Alcohol/week: 14.0 standard drinks    Types: 14 Glasses of wine per week    Comment: one glass of wine daily   Allergies  Allergen Reactions  . Morphine And Related Other (See Comments)    "nightmares" (Patient had Morphine and tolerated it fine)    Prior to Admission medications   Medication Sig Start Date End Date Taking? Authorizing Provider  acetaminophen (TYLENOL) 500 MG tablet Take 500 mg by mouth as needed for  mild pain.   Yes [provider]  allopurinol (ZYLOPRIM) 300 MG tablet Take 150 mg by mouth daily.  05/09/14  Yes [provider]  aspirin EC 81 MG tablet Take 81 mg by mouth at bedtime.    Yes [provider]  ferrous sulfate 325 (65 FE) MG tablet Take 1 tablet (325 mg total) by mouth daily. 09/12/19 11/29/19 Yes Sharen Hones, MD  finasteride (PROSCAR) 5 MG tablet Take 1 tablet (5 mg total) by mouth daily. 08/13/18  Yes Billey Co, MD  loratadine (CLARITIN) 10 MG tablet Take 10 mg by mouth daily as needed. 07/16/19  Yes [provider]  losartan (COZAAR) 25 MG tablet Take 25 mg by mouth daily. 05/10/19  Yes [provider]  metoprolol succinate (TOPROL-XL) 25 MG 24 hr tablet Take 25 mg by mouth daily. 05/10/19  Yes [provider]  Multiple Vitamin (MULTI-VITAMINS) TABS Take 1 tablet by mouth daily.    Yes [provider]  Omega-3 Fatty Acids (FISH OIL) 1000 MG CAPS Take 1 capsule by mouth daily.   Yes [provider]  simvastatin (ZOCOR) 40 MG tablet Take 40 mg by mouth daily at 6 PM.  07/07/14  Yes [provider]  tamsulosin (FLOMAX) 0.4 MG CAPS capsule Take 1 capsule (0.4 mg total) by mouth daily. 08/13/18  Yes Billey Co, MD  torsemide (DEMADEX) 20 MG tablet Take 2 tablets (40 mg total) by mouth daily. 05/28/19 11/29/19 Yes Sreenath, Sudheer B, MD  warfarin (COUMADIN) 3 MG tablet Take 3 mg by mouth daily at 6 PM.  05/02/14  Yes [provider]  colchicine 0.6 MG tablet Take 0.6 mg by mouth as needed.   08/28/18  [provider]    Review of Systems  Constitutional: Positive for fatigue. Negative for activity change and appetite change.  HENT: Negative for congestion, postnasal drip and sore throat.   Eyes: Positive for redness (left eye).  Respiratory: Positive for shortness of breath (with moderate exertion). Negative for cough, chest tightness and wheezing.   Cardiovascular: Positive for leg swelling (R>L). Negative for chest pain and palpitations.  Gastrointestinal: Negative for abdominal distention and abdominal pain.  Endocrine: Negative.   Genitourinary: Negative.   Musculoskeletal: Negative for arthralgias and back pain.  Skin: Negative.   Allergic/Immunologic: Negative.   Neurological: Negative for dizziness, syncope, weakness, light-headedness and headaches.  Hematological: Negative for adenopathy. Bruises/bleeds easily.  Psychiatric/Behavioral: Positive for sleep disturbance (chronic difficulty sleeping). Negative for dysphoric mood. The patient is not nervous/anxious.     Vitals:   11/29/19 1039  BP: (!) 111/59  Pulse: 71   Resp: 18  SpO2: 99%  Weight: 205 lb 9 oz (93.2 kg)  Height: 5\' 7"  (1.702 m)   Wt Readings from Last 3 Encounters:  11/29/19 205 lb 9 oz (93.2 kg)  11/27/19 215 lb 2 oz (97.6 kg)  09/27/19 (!) 204 lb (92.5 kg)   Lab Results  Component Value Date   CREATININE 2.26 (H) 11/27/2019   CREATININE 2.50 (H) 09/16/2019   CREATININE 1.98 (H) 09/12/2019    Physical Exam Vitals and nursing note reviewed.  Constitutional:      General: He is not in acute distress.    Appearance: Normal appearance. He is not ill-appearing, toxic-appearing or diaphoretic.  HENT:     Head: Normocephalic and atraumatic.  Cardiovascular:     Rate and Rhythm: Normal rate and regular rhythm.  Pulmonary:     Effort: Pulmonary effort is normal.  No respiratory distress.     Breath sounds: No wheezing or rales.  Abdominal:     General: There is no distension.     Palpations: Abdomen is soft.  Musculoskeletal:        General: No tenderness.     Cervical back: Normal range of motion and neck supple.     Right lower leg: Edema (3+ pitting although softer) present.     Left lower leg: Edema (1+ pitting) present.  Skin:    General: Skin is warm and dry.  Neurological:     General: No focal deficit present.     Mental Status: He is alert and oriented to person, place, and time.  Psychiatric:        Mood and Affect: Mood normal.        Behavior: Behavior normal.        Thought Content: Thought content normal.    Assessment & Plan:  1: Chronic heart failure with preserved ejection fraction without structural changes- - NYHA class II - continued pedal edema although improved from 2 days ago - weighing daily; reminded to call for an overnight weight gain of >2 pounds or a weekly weight gain of >5 pounds - weight down 10 pounds since last visit here 2 days ago - received 80mg  IV lasix/ 80meq PO potassium 2 days ago; will check BMP today - not adding salt and tries to follow a low sodium diet. Reports he eats  sausage and eggs every morning and has a beer or glass of wine for dinner. - saw cardiology Zenia Resides) 11/13/19 - BNP 11/27/19 was 196.8 - has received both his COVID vaccines  2: HTN- - BP looks good today - saw PCP (Feldpausch) 10/16/19 - BMP 11/27/2019 reviewed and showed sodium 139, potassium 3.9, creatinine 2.26 and GFR 24  3: DM- - A1c on 05/23/19 was 6.0% - saw nephrology Holley Raring) 10/28/19  4: Lymphedema- - stage 2 - admits to not elevating his legs much during the day and he was encouraged to do so - has great difficulty in getting compression socks on and his daughter has ordered a different size so he's waiting on those to arrive - limited in his ability to exercise due to symptoms - consider lymphapress compression boots if edema persists   Patient did not bring his medications nor a list. Each medication was verbally reviewed with the patient and he was encouraged to bring the bottles to every visit to confirm accuracy of list.  Return in 2 weeks or sooner for any questions/problems before then.

## 2019-11-29 ENCOUNTER — Other Ambulatory Visit: Payer: Self-pay

## 2019-11-29 ENCOUNTER — Ambulatory Visit: Payer: Medicare Other | Attending: Family | Admitting: Family

## 2019-11-29 ENCOUNTER — Encounter: Payer: Self-pay | Admitting: Family

## 2019-11-29 VITALS — BP 111/59 | HR 71 | Resp 18 | Ht 67.0 in | Wt 205.6 lb

## 2019-11-29 DIAGNOSIS — M109 Gout, unspecified: Secondary | ICD-10-CM | POA: Insufficient documentation

## 2019-11-29 DIAGNOSIS — E785 Hyperlipidemia, unspecified: Secondary | ICD-10-CM | POA: Insufficient documentation

## 2019-11-29 DIAGNOSIS — N184 Chronic kidney disease, stage 4 (severe): Secondary | ICD-10-CM

## 2019-11-29 DIAGNOSIS — I252 Old myocardial infarction: Secondary | ICD-10-CM | POA: Insufficient documentation

## 2019-11-29 DIAGNOSIS — I890 Lymphedema, not elsewhere classified: Secondary | ICD-10-CM | POA: Insufficient documentation

## 2019-11-29 DIAGNOSIS — I4891 Unspecified atrial fibrillation: Secondary | ICD-10-CM | POA: Diagnosis not present

## 2019-11-29 DIAGNOSIS — I2510 Atherosclerotic heart disease of native coronary artery without angina pectoris: Secondary | ICD-10-CM | POA: Diagnosis not present

## 2019-11-29 DIAGNOSIS — I5032 Chronic diastolic (congestive) heart failure: Secondary | ICD-10-CM | POA: Diagnosis present

## 2019-11-29 DIAGNOSIS — E1122 Type 2 diabetes mellitus with diabetic chronic kidney disease: Secondary | ICD-10-CM | POA: Insufficient documentation

## 2019-11-29 DIAGNOSIS — K219 Gastro-esophageal reflux disease without esophagitis: Secondary | ICD-10-CM | POA: Insufficient documentation

## 2019-11-29 DIAGNOSIS — M1990 Unspecified osteoarthritis, unspecified site: Secondary | ICD-10-CM | POA: Insufficient documentation

## 2019-11-29 DIAGNOSIS — Z7982 Long term (current) use of aspirin: Secondary | ICD-10-CM | POA: Diagnosis not present

## 2019-11-29 DIAGNOSIS — Z7901 Long term (current) use of anticoagulants: Secondary | ICD-10-CM | POA: Diagnosis not present

## 2019-11-29 DIAGNOSIS — I10 Essential (primary) hypertension: Secondary | ICD-10-CM

## 2019-11-29 DIAGNOSIS — Z87891 Personal history of nicotine dependence: Secondary | ICD-10-CM | POA: Insufficient documentation

## 2019-11-29 DIAGNOSIS — Z951 Presence of aortocoronary bypass graft: Secondary | ICD-10-CM | POA: Insufficient documentation

## 2019-11-29 DIAGNOSIS — I130 Hypertensive heart and chronic kidney disease with heart failure and stage 1 through stage 4 chronic kidney disease, or unspecified chronic kidney disease: Secondary | ICD-10-CM | POA: Diagnosis not present

## 2019-11-29 DIAGNOSIS — Z79899 Other long term (current) drug therapy: Secondary | ICD-10-CM | POA: Diagnosis not present

## 2019-11-29 DIAGNOSIS — N189 Chronic kidney disease, unspecified: Secondary | ICD-10-CM | POA: Diagnosis not present

## 2019-11-29 LAB — BASIC METABOLIC PANEL
Anion gap: 8 (ref 5–15)
BUN: 64 mg/dL — ABNORMAL HIGH (ref 8–23)
CO2: 29 mmol/L (ref 22–32)
Calcium: 9.2 mg/dL (ref 8.9–10.3)
Chloride: 103 mmol/L (ref 98–111)
Creatinine, Ser: 1.99 mg/dL — ABNORMAL HIGH (ref 0.61–1.24)
GFR calc Af Amer: 33 mL/min — ABNORMAL LOW (ref >60)
GFR calc non Af Amer: 29 mL/min — ABNORMAL LOW (ref >60)
Glucose, Bld: 93 mg/dL (ref 70–99)
Potassium: 3.6 mmol/L (ref 3.5–5.1)
Sodium: 140 mmol/L (ref 135–145)

## 2019-11-29 NOTE — Patient Instructions (Signed)
Continue weighing daily and call for an overnight weight gain of > 2 pounds or a weekly weight gain of >5 pounds. 

## 2019-12-11 ENCOUNTER — Other Ambulatory Visit: Payer: Self-pay

## 2019-12-11 ENCOUNTER — Ambulatory Visit: Payer: Medicare Other | Attending: Family | Admitting: Family

## 2019-12-11 ENCOUNTER — Encounter: Payer: Self-pay | Admitting: Family

## 2019-12-11 VITALS — BP 124/60 | HR 72 | Resp 18 | Ht 67.0 in | Wt 209.4 lb

## 2019-12-11 DIAGNOSIS — I10 Essential (primary) hypertension: Secondary | ICD-10-CM

## 2019-12-11 DIAGNOSIS — N189 Chronic kidney disease, unspecified: Secondary | ICD-10-CM | POA: Insufficient documentation

## 2019-12-11 DIAGNOSIS — Z7901 Long term (current) use of anticoagulants: Secondary | ICD-10-CM | POA: Diagnosis not present

## 2019-12-11 DIAGNOSIS — R5383 Other fatigue: Secondary | ICD-10-CM | POA: Insufficient documentation

## 2019-12-11 DIAGNOSIS — I5032 Chronic diastolic (congestive) heart failure: Secondary | ICD-10-CM | POA: Diagnosis not present

## 2019-12-11 DIAGNOSIS — I890 Lymphedema, not elsewhere classified: Secondary | ICD-10-CM | POA: Insufficient documentation

## 2019-12-11 DIAGNOSIS — Z7982 Long term (current) use of aspirin: Secondary | ICD-10-CM | POA: Insufficient documentation

## 2019-12-11 DIAGNOSIS — I4891 Unspecified atrial fibrillation: Secondary | ICD-10-CM | POA: Insufficient documentation

## 2019-12-11 DIAGNOSIS — E785 Hyperlipidemia, unspecified: Secondary | ICD-10-CM | POA: Diagnosis not present

## 2019-12-11 DIAGNOSIS — E1122 Type 2 diabetes mellitus with diabetic chronic kidney disease: Secondary | ICD-10-CM | POA: Insufficient documentation

## 2019-12-11 DIAGNOSIS — I130 Hypertensive heart and chronic kidney disease with heart failure and stage 1 through stage 4 chronic kidney disease, or unspecified chronic kidney disease: Secondary | ICD-10-CM | POA: Diagnosis not present

## 2019-12-11 DIAGNOSIS — Z79899 Other long term (current) drug therapy: Secondary | ICD-10-CM | POA: Insufficient documentation

## 2019-12-11 DIAGNOSIS — M109 Gout, unspecified: Secondary | ICD-10-CM | POA: Diagnosis not present

## 2019-12-11 DIAGNOSIS — E1151 Type 2 diabetes mellitus with diabetic peripheral angiopathy without gangrene: Secondary | ICD-10-CM | POA: Insufficient documentation

## 2019-12-11 DIAGNOSIS — I252 Old myocardial infarction: Secondary | ICD-10-CM | POA: Insufficient documentation

## 2019-12-11 DIAGNOSIS — M1990 Unspecified osteoarthritis, unspecified site: Secondary | ICD-10-CM | POA: Diagnosis not present

## 2019-12-11 DIAGNOSIS — G479 Sleep disorder, unspecified: Secondary | ICD-10-CM | POA: Insufficient documentation

## 2019-12-11 DIAGNOSIS — R609 Edema, unspecified: Secondary | ICD-10-CM | POA: Insufficient documentation

## 2019-12-11 DIAGNOSIS — N184 Chronic kidney disease, stage 4 (severe): Secondary | ICD-10-CM

## 2019-12-11 DIAGNOSIS — Z87891 Personal history of nicotine dependence: Secondary | ICD-10-CM | POA: Diagnosis not present

## 2019-12-11 DIAGNOSIS — M7989 Other specified soft tissue disorders: Secondary | ICD-10-CM | POA: Diagnosis not present

## 2019-12-11 DIAGNOSIS — R0602 Shortness of breath: Secondary | ICD-10-CM | POA: Insufficient documentation

## 2019-12-11 DIAGNOSIS — Z951 Presence of aortocoronary bypass graft: Secondary | ICD-10-CM | POA: Insufficient documentation

## 2019-12-11 DIAGNOSIS — I2510 Atherosclerotic heart disease of native coronary artery without angina pectoris: Secondary | ICD-10-CM | POA: Diagnosis not present

## 2019-12-11 NOTE — Patient Instructions (Addendum)
Heart Failure Action Plan A heart failure action plan helps you understand what to do when you have symptoms of heart failure. Follow the plan that was created by you and your health care provider. Review your plan each time you visit your health care provider. Red zone These signs and symptoms mean you should get medical help right away:  You have trouble breathing when resting.  You have a dry cough that is getting worse.  You have swelling or pain in your legs or abdomen that is getting worse.  You suddenly gain more than 2-3 lb (0.9-1.4 kg) in a day, or more than 5 lb (2.3 kg) in one week. This amount may be more or less depending on your condition.  You have trouble staying awake or you feel confused.  You have chest pain.  You do not have an appetite.  You pass out. If you experience any of these symptoms:  Call your local emergency services (911 in the [place] right away or seek help at the emergency department of the nearest hospital. Yellow zone These signs and symptoms mean your condition may be getting worse and you should make some changes:  You have trouble breathing when you are active or you need to sleep with extra pillows.  You have swelling in your legs or abdomen.  You gain 2-3 lb (0.9-1.4 kg) in one day, or 5 lb (2.3 kg) in one week. This amount may be more or less depending on your condition.  You get tired easily.  You have trouble sleeping.  You have a dry cough. If you experience any of these symptoms:  Contact your health care provider within the next day.  Your health care provider may adjust your medicines. Green zone These signs mean you are doing well and can continue what you are doing:  You do not have shortness of breath.  You have very little swelling or no new swelling.  Your weight is stable (no gain or loss).  You have a normal activity level.  You do not have chest pain or any other new symptoms. Follow these instructions at  home:  Take over-the-counter and prescription medicines only as told by your health care provider.  Weigh yourself daily. Your target weight is __________ lb (__________ kg). ? Call your health care provider if you gain more than __________ lb (__________ kg) in a day, or more than __________ lb (__________ kg) in one week.  Eat a heart-healthy diet. Work with a diet and nutrition specialist (dietitian) to create an eating plan that is best for you.  Keep all follow-up visits as told by your health care provider. This is important. Where to find more information  American Heart Association: www.heart.org Summary  Follow the action plan that was created by you and your health care provider.  Get help right away if you have any symptoms in the Red zone. This information is not intended to replace advice given to you by your health care provider. Make sure you discuss any questions you have with your health care provider. Document Revised: 01/27/2017 Document Reviewed: 03/26/2016 Elsevier Patient Education  2020 [place]. Lymphedema  Lymphedema is swelling that is caused by the abnormal collection of lymph in the tissues under the skin. Lymph is fluid from the tissues in your body that is removed through the lymphatic system. This system is part of your body's defense system (immune system) and includes lymph nodes and lymph vessels. The lymph vessels collect and carry  the excess fluid, fats, proteins, and wastes from the tissues of the body to the bloodstream. This system also works to clean and remove bacteria and waste products from the body. Lymphedema occurs when the lymphatic system is blocked. When the lymph vessels or lymph nodes are blocked or damaged, lymph does not drain properly. This causes an abnormal buildup of lymph, which leads to swelling in the affected area. This may include the trunk area, or an arm or leg. Lymphedema cannot be cured by medicines, but various methods  can be used to help reduce the swelling. There are two types of lymphedema: primary lymphedema and secondary lymphedema. What are the causes? The cause of this condition depends on the type of lymphedema that you have.  Primary lymphedema is caused by the absence of lymph vessels or having abnormal lymph vessels at birth.  Secondary lymphedema occurs when lymph vessels are blocked or damaged. Secondary lymphedema is more common. Common causes of lymph vessel blockage include: ? Skin infection, such as cellulitis. ? Infection by parasites (filariasis). ? Injury. ? Radiation therapy. ? Cancer. ? Formation of scar tissue. ? Surgery. What are the signs or symptoms? Symptoms of this condition include:  Swelling of the arm or leg.  A heavy or tight feeling in the arm or leg.  Swelling of the feet, toes, or fingers. Shoes or rings may fit more tightly than before.  Redness of the skin over the affected area.  Limited movement of the affected limb.  Sensitivity to touch or discomfort in the affected limb. How is this diagnosed? This condition may be diagnosed based on:  Your symptoms and medical history.  A physical exam.  Bioimpedance spectroscopy. In this test, painless electrical currents are used to measure fluid levels in your body.  Imaging tests, such as: ? Lymphoscintigraphy. In this test, a low dose of a radioactive substance is injected to trace the flow of lymph through the lymph vessels. ? MRI. ? CT scan. ? Duplex ultrasound. This test uses sound waves to produce images of the vessels and the blood flow on a screen. ? Lymphangiography. In this test, a contrast dye is injected into the lymph vessel to help show blockages. How is this treated? Treatment for this condition may depend on the cause of your lymphedema. Treatment may include:  Complete decongestive therapy (CDT). This is done by a certified lymphedema therapist to reduce fluid congestion. This therapy  includes: ? Manual lymph drainage. This is a special massage technique that promotes lymph drainage out of a limb. ? Skin care. ? Compression wrapping of the affected area. ? Specific exercises. Certain exercises can help fluid move out of the affected limb.  Compression. Various methods may be used to apply pressure to the affected limb to reduce the swelling. They include: ? Wearing compression stockings or sleeves on the affected limb. ? Wrapping the affected limb with special bandages.  Surgery. This is usually done for severe cases only. For example, surgery may be done if you have trouble moving the limb or if the swelling does not get better with other treatments. If an underlying condition is causing the lymphedema, treatment for that condition will be done. For example, antibiotic medicines may be used to treat an infection. Follow these instructions at home: Self-care  The affected area is more likely to become injured or infected. Take these steps to help prevent infection: ? Keep the affected area clean and dry. ? Use approved creams or lotions to keep the  skin moisturized. ? Protect your skin from cuts:  Use gloves while cooking or gardening.  Do not walk barefoot.  If you shave the affected area, use an Copy.  Do not wear tight clothes, shoes, or jewelry.  Eat a healthy diet that includes a lot of fruits and vegetables. Activity  Exercise regularly as directed by your health care provider.  Do not sit with your legs crossed.  When possible, keep the affected limb raised (elevated) above the level of your heart.  Avoid carrying things with an arm that is affected by lymphedema. General instructions  Wear compression stockings or sleeves as told by your health care provider.  Note any changes in size of the affected limb. You may be instructed to take regular measurements and keep track of them.  Take over-the-counter and prescription medicines only  as told by your health care provider.  If you were prescribed an antibiotic medicine, take or apply it as told by your health care provider. Do not stop using the antibiotic even if you start to feel better.  Do not use heating pads or ice packs over the affected area.  Avoid having blood draws, IV insertions, or blood pressure checked on the affected limb.  Keep all follow-up visits as told by your health care provider. This is important. Contact a health care provider if you:  Continue to have swelling in your limb.  Have a cut that does not heal.  Have redness or pain in the affected area. Get help right away if you:  Have new swelling in your limb that comes on suddenly.  Develop purplish spots, rash or sores (lesions) on your affected limb.  Have shortness of breath.  Have a fever or chills. Summary  Lymphedema is swelling that is caused by the abnormal collection of lymph in the tissues under the skin.  Lymph is fluid from the tissues in your body that is removed through the lymphatic system. This system collects and carries excess fluid, fats, proteins, and wastes from the tissues of the body to the bloodstream.  Lymphedema causes swelling, pain, and redness in the affected area. This may include the trunk area, or an arm or leg.  Treatment for this condition may depend on the cause of your lymphedema. Treatment may include complete decongestive therapy (CDT), compression methods, surgery, or treating the underlying cause. This information is not intended to replace advice given to you by your health care provider. Make sure you discuss any questions you have with your health care provider. Document Revised: 02/27/2017 Document Reviewed: 02/27/2017 Elsevier Patient Education  [place] and see if insurance will cover [employer]

## 2019-12-11 NOTE — Progress Notes (Signed)
Patient ID: Carlos [name], male    DOB: 08/16/1926, 86 y.o.   MRN: [id number]  Carlos [name] is a 86 y/o male with a history of CAD, DM, hyperlipidemia, HTN, CKD, PVD, GERD, previous TB, atrial fibrillation, previous tobacco use and chronic heart failure.   Echo report from 05/24/19 reviewed and showed an EF of 60-65% along with mild Carlos.   Was in the ED 09/16/19 due to a cough. Evaluated and released. Admitted 09/10/19 due to bilateral pedal edema. Initially given IV lasix with transition to oral diuretics. Legs wrapped in ACE bandages. Discharged after 2 days. Was in the ED 09/05/19 due to leg edema. CXR negative for pulmonary edema. BNP slightly elevated. Increased oral diuretic and he was released.    He presents today for a follow-up visit with a chief complaint of minimal shortness of breath upon moderate exertion. He describes this as chronic in nature having been present for several years. He has associated fatigue, left eye redness, pedal edema (improving) and chronic difficulty sleeping along with this. He denies any dizziness, abdominal distention, palpitations, chest pain, wheezing, cough or weight gain.   Patient has improving right left edema that is still pitting but improved, and left leg edema is almost resolved. Patient reports inability to get his compression socks on due to leg swelling.   Past Medical History:  Diagnosis Date  . Arthritis   . Atrial fibrillation ([place])   . CHF (congestive heart failure) ([place])   . Chronic kidney disease    kidney stones  . Coronary artery disease   . Diabetes mellitus without complication ([place])   . Diverticulosis   . GERD (gastroesophageal reflux disease)   . Gout   . Hyperlipidemia   . Hypertension   . Myocardial infarction ([place]) 2001  . Peripheral vascular disease ([place])   . Pneumothorax, left 1950   Past Surgical History:  Procedure Laterality Date  . CARDIAC CATHETERIZATION    . CARDIAC SURGERY     4 bypass  . CHOLECYSTECTOMY    .  CORONARY ARTERY BYPASS GRAFT    . CYSTOSCOPY W/ RETROGRADES N/A 12/03/2014   Procedure: CYSTOSCOPY WITH ATTEMPT FOR RETROGRADE PYELOGRAM;  Surgeon: [name], MD;  Location: [facility] ORS;  Service: Urology;  Laterality: N/A;  . CYSTOSCOPY WITH LITHOLAPAXY N/A 12/03/2014   Procedure: CYSTOSCOPY WITH LITHOLAPAXY WITH HOLMIUM LASER ;  Surgeon: [name], MD;  Location: [facility] ORS;  Service: Urology;  Laterality: N/A;  . FEMUR SURGERY    . GALLBLADDER SURGERY    . HERNIA REPAIR    . JOINT REPLACEMENT Right    Total Knee Replacement  . LUNG SURGERY Left   . REPLACEMENT TOTAL KNEE     Family History  Problem Relation Age of Onset  . Lung cancer Mother   . Tuberculosis Brother    Social History   Tobacco Use  . Smoking status: Former Smoker    Types: Pipe    Quit date: 07/30/2006    Years since quitting: 13.3  . Smokeless tobacco: Never Used  Substance Use Topics  . Alcohol use: Yes    Alcohol/week: 14.0 standard drinks    Types: 14 Glasses of wine per week    Comment: one glass of wine daily   Allergies  Allergen Reactions  . Morphine And Related Other (See Comments)    "nightmares" (Patient had Morphine and tolerated it fine)    Prior to Admission medications   Medication Sig Start Date End Date Taking? Authorizing  Provider  acetaminophen (TYLENOL) 500 MG tablet Take 500 mg by mouth as needed for mild pain.   Yes [provider]  allopurinol (ZYLOPRIM) 300 MG tablet Take 150 mg by mouth daily.  05/09/14  Yes [provider]  aspirin EC 81 MG tablet Take 81 mg by mouth at bedtime.    Yes [provider]  ferrous sulfate 325 (65 FE) MG tablet Take 1 tablet (325 mg total) by mouth daily. 09/12/19 11/29/19 Yes Sharen Hones, MD  finasteride (PROSCAR) 5 MG tablet Take 1 tablet (5 mg total) by mouth daily. 08/13/18  Yes Billey Co, MD  loratadine (CLARITIN) 10 MG tablet Take 10 mg by mouth daily as needed. 07/16/19  Yes [provider]   losartan (COZAAR) 25 MG tablet Take 25 mg by mouth daily. 05/10/19  Yes [provider]  metoprolol succinate (TOPROL-XL) 25 MG 24 hr tablet Take 25 mg by mouth daily. 05/10/19  Yes [provider]  Multiple Vitamin (MULTI-VITAMINS) TABS Take 1 tablet by mouth daily.    Yes [provider]  Omega-3 Fatty Acids (FISH OIL) 1000 MG CAPS Take 1 capsule by mouth daily.   Yes [provider]  simvastatin (ZOCOR) 40 MG tablet Take 40 mg by mouth daily at 6 PM.  07/07/14  Yes [provider]  tamsulosin (FLOMAX) 0.4 MG CAPS capsule Take 1 capsule (0.4 mg total) by mouth daily. 08/13/18  Yes Billey Co, MD  torsemide (DEMADEX) 20 MG tablet Take 2 tablets (40 mg total) by mouth daily. 05/28/19 11/29/19 Yes Sreenath, Sudheer B, MD  warfarin (COUMADIN) 3 MG tablet Take 3 mg by mouth daily at 6 PM.  05/02/14  Yes [provider]  colchicine 0.6 MG tablet Take 0.6 mg by mouth as needed.   08/28/18  [provider]    Review of Systems  Constitutional: Positive for fatigue. Negative for activity change and appetite change.  HENT: Negative for congestion, postnasal drip and sore throat.   Eyes: Positive for redness (left eye).       Left eye twitching  Respiratory: Positive for shortness of breath (with moderate exertion). Negative for cough, chest tightness and wheezing.   Cardiovascular: Positive for leg swelling (R>L). Negative for chest pain and palpitations.  Gastrointestinal: Negative for abdominal distention and abdominal pain.  Endocrine: Negative.   Genitourinary: Negative.   Musculoskeletal: Negative for arthralgias and back pain.  Skin: Negative.   Allergic/Immunologic: Negative.   Neurological: Negative for dizziness, syncope, weakness, light-headedness and headaches.  Hematological: Negative for adenopathy. Bruises/bleeds easily.  Psychiatric/Behavioral: Positive for sleep disturbance (chronic difficulty sleeping). Negative for  dysphoric mood. The patient is not nervous/anxious.     Vitals:   12/11/19 1048  BP: 124/60  Pulse: 72  Resp: 18  SpO2: 97%  Weight: 209 lb 6 oz (95 kg)  Height: 5\' 7"  (1.702 m)   Wt Readings from Last 3 Encounters:  12/11/19 209 lb 6 oz (95 kg)  11/29/19 205 lb 9 oz (93.2 kg)  11/27/19 215 lb 2 oz (97.6 kg)   Lab Results  Component Value Date   CREATININE 1.99 (H) 11/29/2019   CREATININE 2.26 (H) 11/27/2019   CREATININE 2.50 (H) 09/16/2019    Physical Exam Vitals and nursing note reviewed.  Constitutional:      General: He is not in acute distress.    Appearance: Normal appearance. He is not ill-appearing, toxic-appearing or diaphoretic.  HENT:     Head: Normocephalic and atraumatic.  Cardiovascular:     Rate and Rhythm: Normal rate and regular rhythm.  Pulmonary:     Effort: Pulmonary effort is normal. No respiratory distress.     Breath sounds: No wheezing or rales.  Abdominal:     General: There is no distension.     Palpations: Abdomen is soft.  Musculoskeletal:        General: No tenderness.     Cervical back: Normal range of motion and neck supple.     Right lower leg: Edema (3+ pitting although softer) present.     Left lower leg: Edema (Trace pitting) present.  Skin:    General: Skin is warm and dry.  Neurological:     General: No focal deficit present.     Mental Status: He is alert and oriented to person, place, and time.  Psychiatric:        Mood and Affect: Mood normal.        Behavior: Behavior normal.        Thought Content: Thought content normal.    Assessment & Plan:  1: Chronic heart failure with preserved ejection fraction without structural changes- - NYHA class II - continued pedal edema although improved overall - weighing daily; reminded to call for an overnight weight gain of >2 pounds or a weekly weight gain of >5 pounds - weight up 4 pounds since last visit here 12 days ago - not adding salt and tries to follow a low sodium  diet. Reports he eats sausage and eggs every morning and has a beer or glass of wine for dinner. - saw cardiology Zenia Resides) 11/13/19 - BNP 11/27/19 was 196.8 - has received both his COVID vaccines  2: HTN- - BP looks good today (124/60) - saw PCP (Feldpausch) 10/16/19 - BMP 11/29/2019 reviewed and showed sodium 140, potassium 3.6, creatinine 1.99 and GFR 29  3: DM- - A1c on 05/23/19 was 6.0% - saw nephrology Holley Raring) 10/28/19  4: Lymphedema- - stage 2 - admits to not elevating his legs much during the day and he was encouraged to do so - has great difficulty in getting compression socks on  - limited in his ability to exercise due to symptoms - Consult placed today for lymphedema compression boots   Patient did not bring his medications nor a list. Each medication was verbally reviewed with the patient and he was encouraged to bring the bottles to every visit to confirm accuracy of list.  Return in 3 months or sooner for any questions/problems before then.

## 2020-01-08 ENCOUNTER — Emergency Department: Payer: Medicare Other

## 2020-01-08 ENCOUNTER — Emergency Department
Admission: EM | Admit: 2020-01-08 | Discharge: 2020-01-09 | Disposition: A | Discharge status: Home / Self Care | Payer: Medicare Other | Attending: Emergency Medicine | Admitting: Emergency Medicine

## 2020-01-08 ENCOUNTER — Other Ambulatory Visit: Payer: Self-pay

## 2020-01-08 DIAGNOSIS — Z20822 Contact with and (suspected) exposure to covid-19: Secondary | ICD-10-CM | POA: Insufficient documentation

## 2020-01-08 DIAGNOSIS — S065XAA Traumatic subdural hemorrhage with loss of consciousness status unknown, initial encounter: Secondary | ICD-10-CM

## 2020-01-08 DIAGNOSIS — S065X9A Traumatic subdural hemorrhage with loss of consciousness of unspecified duration, initial encounter: Secondary | ICD-10-CM

## 2020-01-08 DIAGNOSIS — I5043 Acute on chronic combined systolic (congestive) and diastolic (congestive) heart failure: Secondary | ICD-10-CM | POA: Insufficient documentation

## 2020-01-08 DIAGNOSIS — Z951 Presence of aortocoronary bypass graft: Secondary | ICD-10-CM | POA: Insufficient documentation

## 2020-01-08 DIAGNOSIS — Z7901 Long term (current) use of anticoagulants: Secondary | ICD-10-CM | POA: Diagnosis not present

## 2020-01-08 DIAGNOSIS — N184 Chronic kidney disease, stage 4 (severe): Secondary | ICD-10-CM | POA: Diagnosis not present

## 2020-01-08 DIAGNOSIS — E1122 Type 2 diabetes mellitus with diabetic chronic kidney disease: Secondary | ICD-10-CM | POA: Diagnosis not present

## 2020-01-08 DIAGNOSIS — Z96651 Presence of right artificial knee joint: Secondary | ICD-10-CM | POA: Insufficient documentation

## 2020-01-08 DIAGNOSIS — Z79899 Other long term (current) drug therapy: Secondary | ICD-10-CM | POA: Diagnosis not present

## 2020-01-08 DIAGNOSIS — J40 Bronchitis, not specified as acute or chronic: Secondary | ICD-10-CM

## 2020-01-08 DIAGNOSIS — R059 Cough, unspecified: Secondary | ICD-10-CM | POA: Insufficient documentation

## 2020-01-08 DIAGNOSIS — I2510 Atherosclerotic heart disease of native coronary artery without angina pectoris: Secondary | ICD-10-CM | POA: Insufficient documentation

## 2020-01-08 DIAGNOSIS — W01198A Fall on same level from slipping, tripping and stumbling with subsequent striking against other object, initial encounter: Secondary | ICD-10-CM | POA: Insufficient documentation

## 2020-01-08 DIAGNOSIS — S0003XA Contusion of scalp, initial encounter: Secondary | ICD-10-CM | POA: Diagnosis not present

## 2020-01-08 DIAGNOSIS — I130 Hypertensive heart and chronic kidney disease with heart failure and stage 1 through stage 4 chronic kidney disease, or unspecified chronic kidney disease: Secondary | ICD-10-CM | POA: Insufficient documentation

## 2020-01-08 DIAGNOSIS — Z87891 Personal history of nicotine dependence: Secondary | ICD-10-CM | POA: Diagnosis not present

## 2020-01-08 DIAGNOSIS — S0990XA Unspecified injury of head, initial encounter: Secondary | ICD-10-CM | POA: Diagnosis present

## 2020-01-08 LAB — CBC
HCT: 33.8 % — ABNORMAL LOW (ref 39.0–52.0)
Hemoglobin: 10.8 g/dL — ABNORMAL LOW (ref 13.0–17.0)
MCH: 31.2 pg (ref 26.0–34.0)
MCHC: 32 g/dL (ref 30.0–36.0)
MCV: 97.7 fL (ref 80.0–100.0)
Platelets: 183 10*3/uL (ref 150–400)
RBC: 3.46 MIL/uL — ABNORMAL LOW (ref 4.22–5.81)
RDW: 14.4 % (ref 11.5–15.5)
WBC: 6.5 10*3/uL (ref 4.0–10.5)
nRBC: 0 % (ref 0.0–0.2)

## 2020-01-08 LAB — BASIC METABOLIC PANEL
Anion gap: 9 (ref 5–15)
BUN: 47 mg/dL — ABNORMAL HIGH (ref 8–23)
CO2: 29 mmol/L (ref 22–32)
Calcium: 8.8 mg/dL — ABNORMAL LOW (ref 8.9–10.3)
Chloride: 101 mmol/L (ref 98–111)
Creatinine, Ser: 2.13 mg/dL — ABNORMAL HIGH (ref 0.61–1.24)
GFR, Estimated: 29 mL/min — ABNORMAL LOW (ref >60)
Glucose, Bld: 99 mg/dL (ref 70–99)
Potassium: 3.6 mmol/L (ref 3.5–5.1)
Sodium: 139 mmol/L (ref 135–145)

## 2020-01-08 LAB — BRAIN NATRIURETIC PEPTIDE: B Natriuretic Peptide: 189 pg/mL — ABNORMAL HIGH (ref 0.0–100.0)

## 2020-01-08 LAB — PROTIME-INR
INR: 1.6 — ABNORMAL HIGH (ref 0.8–1.2)
INR: 1.1 (ref 0.8–1.2)
Prothrombin Time: 18.8 seconds — ABNORMAL HIGH (ref 11.4–15.2)
Prothrombin Time: 14.1 seconds (ref 11.4–15.2)

## 2020-01-08 LAB — RESPIRATORY PANEL BY RT PCR (FLU A&B, COVID)
Influenza A by PCR: NEGATIVE
Influenza B by PCR: NEGATIVE
SARS Coronavirus 2 by RT PCR: NEGATIVE

## 2020-01-08 MED ORDER — SODIUM CHLORIDE 0.9 % IV BOLUS
1000.0000 mL | Freq: Once | INTRAVENOUS | Status: AC
  Administered 2020-01-08: 1000 mL via INTRAVENOUS

## 2020-01-08 MED ORDER — BENZONATATE 100 MG PO CAPS: 100.0000 mg | ORAL_CAPSULE | Freq: Three times a day (TID) | ORAL | 0 refills | Status: DC | PRN

## 2020-01-08 MED ORDER — VITAMIN K1 10 MG/ML IJ SOLN
10.0000 mg | INTRAVENOUS | Status: AC
  Administered 2020-01-08: 10 mg via INTRAVENOUS
  Filled 2020-01-08: qty 1

## 2020-01-08 MED ORDER — AZITHROMYCIN 250 MG PO TABS: ORAL_TABLET | ORAL | 0 refills | Status: DC

## 2020-01-08 MED ORDER — PROTHROMBIN COMPLEX CONC HUMAN 500 UNITS IV KIT
1623.0000 [IU] | PACK | Status: AC
  Administered 2020-01-08: 1623 [IU] via INTRAVENOUS
  Filled 2020-01-08 (×2): qty 1623

## 2020-01-08 MED ORDER — ACETAMINOPHEN 500 MG PO TABS
1000.0000 mg | ORAL_TABLET | Freq: Once | ORAL | Status: AC
  Administered 2020-01-08: 1000 mg via ORAL
  Filled 2020-01-08: qty 2

## 2020-01-08 MED ORDER — ACETAMINOPHEN 325 MG PO TABS: 650.0000 mg | ORAL_TABLET | Freq: Four times a day (QID) | ORAL | 0 refills | Status: AC | PRN

## 2020-01-08 NOTE — ED Notes (Signed)
Pt in CT.

## 2020-01-08 NOTE — ED Provider Notes (Signed)
[facility] Emergency Department Provider Note  ____________________________________________   First MD Initiated Contact with Patient 01/08/20 1503     (approximate)  I have reviewed the triage vital signs and the nursing notes.   HISTORY  Chief Complaint Fall    HPI Carlos [name] is a 86 y.o. male history of atrial fibrillation, congestive heart failure chronic kidney disease  Patient reports that for about 4 more weeks now has been experiencing a cough feels slightly congested.  Feels like mild sinus congestion as well.  Reports that the symptoms have just persisted for 4 weeks he went to see his primary care physician today as he felt like he might have "bronchitis".  While in the parking lot he was getting his walker out of his car when he lost balance and fell backwards striking his head.  No fevers or chills.  Denies increased weight or leg swelling he has chronic swelling in both ankles.  Does take Coumadin.  Denies headache separate sore over the back of the scalp where he has a small knot.  No chest pain.  No trouble breathing at night.  Denies Covid exposure and reports being vaccinated  Reports he just went to see his doctor about the cough and to have a chest x-ray, but did not anticipate falling in the parking lot.  Despite this he is able to get up and seen by the doctor's office, reviewed their documentation they recommended he come to the ER to have a CT of the head to be evaluated due to the head injury.  Evidently he was confused at that point, but of note on this point he is alert well oriented and remembers going to the doctor's office falling and going into their office as well as the recommendation to come here.  Past Medical History:  Diagnosis Date  . Arthritis   . Atrial fibrillation ([place])   . CHF (congestive heart failure) ([place])   . Chronic kidney disease    kidney stones  . Coronary artery disease   . Diabetes mellitus without  complication ([place])   . Diverticulosis   . GERD (gastroesophageal reflux disease)   . Gout   . Hyperlipidemia   . Hypertension   . Myocardial infarction ([place]) 2001  . Peripheral vascular disease ([place])   . Pneumothorax, left 1950    Patient Active Problem List   Diagnosis Date Noted  . Anemia of chronic disease 09/10/2019  . 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 Acute on chronic combined systolic and diastolic CHF (congestive heart failure) ([place]) 05/23/2019  . Acute pain of left shoulder 09/08/2015  . CKD (chronic kidney disease), stage IV ([place]) 12/24/2014  . Bradycardia 12/02/2014  . TI (tricuspid incompetence) 10/21/2014  . DD (diverticular disease) 07/30/2014  . Essential (primary) hypertension 07/30/2014  . Tuberculosis 07/30/2014  . Hyperlipidemia 07/30/2014  . Chronic systolic CHF (congestive heart failure), NYHA class 2 ([place]) 02/12/2014  . AF (paroxysmal atrial fibrillation) ([place]) 02/12/2014  . Paroxysmal A-fib ([place]) 02/12/2014  . Accumulation of fluid in tissues 07/24/2013  . Acid reflux 07/24/2013  . Gout 07/24/2013  . Diabetes mellitus, type 2 ([place]) 07/24/2013  . Type 2 diabetes mellitus with renal manifestations, controlled ([place]) 07/24/2013  . Peripheral blood vessel disorder ([place]) 07/19/2013  . PVD (peripheral vascular disease) ([place]) 07/19/2013  . Combined fat and carbohydrate induced hyperlipemia 05/21/2013  .  Encounter for therapeutic drug monitoring 12/18/2012  . Chronic kidney disease (CKD), stage IV (severe) ([place]) 12/18/2012  . Arteriosclerosis of coronary artery 12/18/2012  . Decreased potassium in the blood 12/18/2012  . H/O coronary artery bypass surgery 12/18/2012  . H/O total knee replacement 12/18/2012  . Encounter for therapeutic drug level monitoring 12/18/2012    Past Surgical History:  Procedure Laterality Date  . CARDIAC  CATHETERIZATION    . CARDIAC SURGERY     4 bypass  . CHOLECYSTECTOMY    . CORONARY ARTERY BYPASS GRAFT    . CYSTOSCOPY W/ RETROGRADES N/A 12/03/2014   Procedure: CYSTOSCOPY WITH ATTEMPT FOR RETROGRADE PYELOGRAM;  Surgeon: [name], MD;  Location: [facility] ORS;  Service: Urology;  Laterality: N/A;  . CYSTOSCOPY WITH LITHOLAPAXY N/A 12/03/2014   Procedure: CYSTOSCOPY WITH LITHOLAPAXY WITH HOLMIUM LASER ;  Surgeon: [name], MD;  Location: [facility] ORS;  Service: Urology;  Laterality: N/A;  . FEMUR SURGERY    . GALLBLADDER SURGERY    . HERNIA REPAIR    . JOINT REPLACEMENT Right    Total Knee Replacement  . LUNG SURGERY Left   . REPLACEMENT TOTAL KNEE      Prior to Admission medications   Medication Sig Start Date End Date Taking? Authorizing Provider  acetaminophen (TYLENOL) 500 MG tablet Take 500 mg by mouth as needed for mild pain.    [provider]  allopurinol (ZYLOPRIM) 300 MG tablet Take 150 mg by mouth daily.  05/09/14   [provider]  aspirin EC 81 MG tablet Take 81 mg by mouth at bedtime.     [provider]  ferrous sulfate 325 (65 FE) MG tablet Take 1 tablet (325 mg total) by mouth daily. 09/12/19 11/29/19  Sharen Hones, MD  finasteride (PROSCAR) 5 MG tablet Take 1 tablet (5 mg total) by mouth daily. 08/13/18   Billey Co, MD  loratadine (CLARITIN) 10 MG tablet Take 10 mg by mouth daily as needed. 07/16/19   [provider]  losartan (COZAAR) 25 MG tablet Take 25 mg by mouth daily. 05/10/19   [provider]  metoprolol succinate (TOPROL-XL) 25 MG 24 hr tablet Take 25 mg by mouth daily. 05/10/19   [provider]  Multiple Vitamin (MULTI-VITAMINS) TABS Take 1 tablet by mouth daily.     [provider]  Omega-3 Fatty Acids (FISH OIL) 1000 MG CAPS Take 1 capsule by mouth daily.    [provider]  simvastatin (ZOCOR) 40 MG tablet Take 40 mg by mouth daily at 6 PM.  07/07/14   [provider]   tamsulosin (FLOMAX) 0.4 MG CAPS capsule Take 1 capsule (0.4 mg total) by mouth daily. 08/13/18   Billey Co, MD  torsemide (DEMADEX) 20 MG tablet Take 2 tablets (40 mg total) by mouth daily. 05/28/19 11/29/19  Sidney Ace, MD  warfarin (COUMADIN) 3 MG tablet Take 3 mg by mouth daily at 6 PM.  05/02/14   [provider]  colchicine 0.6 MG tablet Take 0.6 mg by mouth as needed.   08/28/18  [provider]    Allergies Morphine and related  Family History  Problem Relation Age of Onset  . Lung cancer Mother   . Tuberculosis Brother     Social History Social History   Tobacco Use  . Smoking status: Former Smoker    Types: Pipe    Quit date: 07/30/2006    Years since quitting: 13.4  . Smokeless tobacco:  Never Used  Vaping Use  . Vaping Use: Never used  Substance Use Topics  . Alcohol use: Yes    Alcohol/week: 14.0 standard drinks    Types: 14 Glasses of wine per week    Comment: one glass of wine daily  . Drug use: No    Review of Systems Constitutional: No fever/chills Eyes: No visual changes. ENT: No sore throat.  Some runny nose and primarily dry cough and congestion for about 3 or 4 weeks Cardiovascular: Denies chest pain. Respiratory: Denies shortness of breath.  Cough slightly wet at times for about 3 to 4 weeks mostly dry the Gastrointestinal: No abdominal pain.   Genitourinary: Negative for dysuria. Musculoskeletal: Negative for back pain. Skin: Negative for rash. Neurological: Negative for headaches except for some soreness in a "knot" over the top of his scalp, areas of focal weakness or numbness.    ____________________________________________   PHYSICAL EXAM:  VITAL SIGNS: ED Triage Vitals  Enc Vitals Group     BP 01/08/20 1419 125/89     Pulse Rate 01/08/20 1419 77     Resp 01/08/20 1419 16     Temp 01/08/20 1419 97.7 F (36.5 C)     Temp Source 01/08/20 1419 Oral     SpO2 01/08/20 1437 95 %     Weight 01/08/20 1415 206  lb (93.4 kg)     Height 01/08/20 1415 5\' 7"  (1.702 m)     Head Circumference --      Peak Flow --      Pain Score 01/08/20 1415 2     Pain Loc --      Pain Edu? --      Excl. in [place]? --     Constitutional: Alert and oriented. Well appearing and in no acute distress. Eyes: Conjunctivae are normal. Head: Atraumatic except for a small hematoma with overlying abrasion but no laceration near the vertex of the scalp without 1 inch in diameter. Nose: No congestion/rhinnorhea. Mouth/Throat: Mucous membranes are moist. Neck: No stridor.  No cervical tenderness.  Full range of motion of the neck without pain or discomfort. Cardiovascular: Normal rate, slight irregular rhythm. Grossly normal heart sounds.  Good peripheral circulation. Respiratory: Normal respiratory effort.  No retractions. Lungs CTAB. Gastrointestinal: Soft and nontender. No distention. Musculoskeletal: No lower extremity tenderness nor edema.  No midline cervical tenderness. Neurologic:  Normal speech and language. No gross focal neurologic deficits are appreciated.  Skin:  Skin is warm, dry and intact. No rash noted. Psychiatric: Mood and affect are normal. Speech and behavior are normal.  ____________________________________________   LABS (all labs ordered are listed, but only abnormal results are displayed)  Labs Reviewed  PROTIME-INR - Abnormal; Notable for the following components:      Result Value   Prothrombin Time 18.8 (*)    INR 1.6 (*)    All other components within normal limits  CBC - Abnormal; Notable for the following components:   RBC 3.46 (*)    Hemoglobin 10.8 (*)    HCT 33.8 (*)    All other components within normal limits  BASIC METABOLIC PANEL - Abnormal; Notable for the following components:   BUN 47 (*)    Creatinine, Ser 2.13 (*)    Calcium 8.8 (*)    GFR, Estimated 29 (*)    All other components within normal limits  RESPIRATORY PANEL BY RT PCR (FLU A&B, COVID)  BRAIN NATRIURETIC PEPTIDE   BRAIN NATRIURETIC PEPTIDE   ____________________________________________  EKG  ____________________________________________  RADIOLOGY  Chest x-ray and head CT pending at time of signout to Dr. Ellender Hose ____________________________________________   PROCEDURES  Procedure(s) performed: None  Procedures  Critical Care performed: No  ____________________________________________   INITIAL IMPRESSION / ASSESSMENT AND PLAN / ED COURSE  Pertinent labs & imaging results that were available during my care of the patient were reviewed by me and considered in my medical decision making (see chart for details).   Patient here for evaluation after a fall while going to his doctor's office to be evaluated for a cough.  Discussed with the patient is chronic cough seem to be somewhat chronic he felt as though he may have bronchitis he does have a history of CHF but denies symptoms to clearly demonstrate exacerbation.  Has chronic kidney disease, laboratory evaluation today reassuring appears to be near patient's or at patient's baseline with regard to creatinine.  INR 1.6.  CT the head ordered, pending at time of signout Dr. Ellender Hose as well as follow-up on chest x-ray.  Consider treatment for possible bronchitis if no evidence of CHF exacerbation or other clear cause such as pneumonia, etc  Follow-up on CT head to evaluate and rule out intracranial hemorrhage.        ____________________________________________   FINAL CLINICAL IMPRESSION(S) / ED DIAGNOSES  Final diagnoses:  Cough  Hematoma of scalp, initial encounter        Note:  This document was prepared using Dragon voice recognition software and may include unintentional dictation errors       Delman Kitten, MD 01/08/20 1523

## 2020-01-08 NOTE — Discharge Instructions (Addendum)
Return for any worsening or any other problems.  Please follow-up with your regular doctor within the week.

## 2020-01-08 NOTE — ED Provider Notes (Signed)
Assumed care from Dr. Jacqualine Code at 3 PM. Briefly, the patient is a 86 y.o. male with PMHx of  has a past medical history of Arthritis, Atrial fibrillation ([place]), CHF (congestive heart failure) ([place]), Chronic kidney disease, Coronary artery disease, Diabetes mellitus without complication ([place]), Diverticulosis, GERD (gastroesophageal reflux disease), Gout, Hyperlipidemia, Hypertension, Myocardial infarction ([place]) (2001), Peripheral vascular disease ([place]), and Pneumothorax, left (1950). here with fall on coumadin. CT head pending. Pt also c/o several weeks of occasionally productive cough.   Labs Reviewed  PROTIME-INR - Abnormal; Notable for the following components:      Result Value   Prothrombin Time 18.8 (*)    INR 1.6 (*)    All other components within normal limits  CBC - Abnormal; Notable for the following components:   RBC 3.46 (*)    Hemoglobin 10.8 (*)    HCT 33.8 (*)    All other components within normal limits  BASIC METABOLIC PANEL - Abnormal; Notable for the following components:   BUN 47 (*)    Creatinine, Ser 2.13 (*)    Calcium 8.8 (*)    GFR, Estimated 29 (*)    All other components within normal limits  BRAIN NATRIURETIC PEPTIDE - Abnormal; Notable for the following components:   B Natriuretic Peptide 189.0 (*)    All other components within normal limits  RESPIRATORY PANEL BY RT PCR (FLU A&B, COVID)  BRAIN NATRIURETIC PEPTIDE    Course of Care: -CT Head shows 2.6 mm SDH in anterior and inferior right frontal lobe. INR 1.6. Will give [facility] for coumadin reversal, plan to discuss with NSGY. Pt has significant brain atrophy, unlikely to cause major issues but will likely need repeat scan/observation. -Discussed with Dr. Cari Caraway of NSGY - will check repeat CT at 6 hr, d/c if negative. Can f/u as outpt. Hold anticoagulation. -Repeat CT shows no change in bleed, no new bleeds. Pt remains well appearing and in NAD. Labs otherwise reassuring/unremarkable.  Discussed with pt and  pt's daughter via telephone. Pt has no one to pick him up at this hour and cannot drive home. Will wait till AM to d/c. Daughter aware. He and daughter have been notified that he should hold all anticoagulation at this time until cleared by [facility].      Duffy Bruce, MD 01/08/20 2256

## 2020-01-08 NOTE — ED Notes (Signed)
Pt alert and oriented x4. Pt calm, NAD, denies current complaints. Pt states he is going to try and get some sleep

## 2020-01-08 NOTE — ED Triage Notes (Addendum)
Pt BIB EMS from clinic after falling and hitting his head - states he lost balance. Pt denies LOC. Pt is on coumadin. Denies weakness/dizziness. Pt is AOx4. Pt endorses mechanical recent fall within the last month.

## 2020-01-08 NOTE — ED Notes (Signed)
Pt's family updated with CT result. Pt comfortable with no complaints at this time

## 2020-01-08 NOTE — ED Notes (Signed)
Pt repositioned. Meal tray ordered

## 2020-01-09 LAB — PROTIME-INR
INR: 1.1 (ref 0.8–1.2)
INR: 1.2 (ref 0.8–1.2)
INR: 1.2 (ref 0.8–1.2)
Prothrombin Time: 14 seconds (ref 11.4–15.2)
Prothrombin Time: 14.3 seconds (ref 11.4–15.2)
Prothrombin Time: 15 seconds (ref 11.4–15.2)

## 2020-01-09 NOTE — ED Notes (Signed)
Pt given lunch tray.

## 2020-01-09 NOTE — ED Provider Notes (Addendum)
Patient seen by Education officer, museum.  He does not want to go to a SNF.  He wants to go home.  He will take home health.  We have arranged home health PT and nursing.  His daughter is also going to keep an eye on him.  Social work also spoke with the daughter who confirms that he is able to do his ADLs at home and that he is active with [facility] with PT and home nursing.  We will put in another PT consult so he can get more.  Also more home health nursing.   Nena Polio, MD 01/09/20 1517 Social work said not to put in home health aide he already has the nurse and that is enough.   Nena Polio, MD 01/09/20 [phone]

## 2020-01-09 NOTE — ED Notes (Signed)
PT contacted at this time for safe patient discharge.  [phone]

## 2020-01-09 NOTE — ED Notes (Signed)
Dietary called and breakfast tray ordered.

## 2020-01-09 NOTE — TOC Transition Note (Signed)
Transition of Care [facility]) - CM/SW Discharge Note   Patient Details  Name: Carlos [name] MRN: [id number] Date of Birth: [birth year]  [facility]) CM/SW Contact:  Ova Freshwater Phone Number: [phone] 01/09/2020, 3:06 PM   Clinical Narrative:     Patient will d/c home with home health PT.  Patient already active with [facility] for PT.  Patient will be transported home by [facility]. EDP/E Staff notified. TOC consult complete.  Final next level of care: Home/Self Care     Patient Goals and CMS Choice Patient states their goals for this hospitalization and ongoing recovery are:: Patietn states he does not want to go to SNF, he wants to return home with home health.      Discharge Placement                       Discharge Plan and Services In-house Referral: Clinical Social Work   Post Acute Care Choice: Home Health (PT/RN)                               Social Determinants of Health (SDOH) Interventions     Readmission Risk Interventions No flowsheet data found.

## 2020-01-09 NOTE — Evaluation (Signed)
Physical Therapy Evaluation Patient Details Name: Carlos [name] MRN: [id number] DOB: 01/28/1927 Today's Date: 01/09/2020   History of Present Illness  Carlos [name] is a 28yoM who comes [facility] on 11/10after fall in parkinglot whilst pulling his rollator from the trunk, sustaining head deceleration injury on ground. Imaging reveals small stable SHD frontal lobe. PT called to evaluate patient in ED for direct DC return to home.  Clinical Impression  Pt admitted with above diagnosis. Pt currently with functional limitations due to the deficits listed below (see "PT Problem List"). Upon entry, pt in bed, awake and agreeable to participate. Pt familiar to author from prior admission. The pt is alert and oriented x4, pleasant, conversational, and generally a good historian. ModI to EOB, Supervision for rising to standing c RW, MinGuard to supervision for AMB c RW.Pt tolerates ~367ft c RW which in the 90th percentile of distances he has been able to AMB with therapy in all admissions this year- somewhat close to baseline. Functional mobility assessment demonstrates increased effort/time requirements, fair tolerance, but no frank need for physical assistance, whereas the patient performed these at a higher level of independence PTA. Pt will benefit from skilled PT intervention to increase independence and safety with basic mobility in preparation for discharge to the venue listed below.       Follow Up Recommendations Home health PT;Supervision for mobility/OOB (was active with HHPT prior to arrival)    Equipment Recommendations  None recommended by PT    Recommendations for Other Services       Precautions / Restrictions Precautions Precautions: Fall Restrictions Weight Bearing Restrictions: No      Mobility  Bed Mobility Overal bed mobility: Modified Independent                  Transfers Overall transfer level: Modified independent Equipment used: Rolling walker (2 wheeled)                 Ambulation/Gait Ambulation/Gait assistance: Min guard;Supervision Gait Distance (Feet): 330 Feet Assistive device: Rolling walker (2 wheeled) Gait Pattern/deviations: WFL(Within Functional Limits) Gait velocity: 0.44m/s (typically able to AMB between 0.33-0.26m/s)   General Gait Details: similar to prior episodes  Stairs            Wheelchair Mobility    Modified Rankin (Stroke Patients Only)       Balance Overall balance assessment: History of Falls;Modified Independent;No apparent balance deficits (not formally assessed)                                           Pertinent Vitals/Pain Pain Assessment:  (some dispersed soreness of muscles; head only hurts with digital inspection of head wound)    Home Living Family/patient expects to be discharged to:: Private residence Living Arrangements: Alone Available Help at Discharge: Friend(s) Type of Home: House Home Access: Stairs to enter Entrance Stairs-Rails: Right Entrance Stairs-Number of Steps: 4 Home Layout: One level Home Equipment: Grab bars - tub/shower;Walker - 4 wheels;Cane - single point;Grab bars - toilet;Hand held shower head;Shower seat - built in      Prior Function Level of Independence: Independent with assistive device(s)         Comments: Ambulates with SPC in community usually but uses rollator within home (has multiple rollators); h/o falls (3-4 falls in past 6 months--usually falls backwards or off chair)     Hand Dominance  Extremity/Trunk Assessment   Upper Extremity Assessment Upper Extremity Assessment: Overall WFL for tasks assessed    Lower Extremity Assessment Lower Extremity Assessment: Overall WFL for tasks assessed       Communication   Communication: No difficulties  Cognition Arousal/Alertness: Awake/alert Behavior During Therapy: WFL for tasks assessed/performed Overall Cognitive Status: Within Functional Limits for tasks  assessed                                        General Comments      Exercises     Assessment/Plan    PT Assessment Patient needs continued PT services  PT Problem List Decreased strength;Decreased activity tolerance;Decreased balance       PT Treatment Interventions DME instruction;Balance training;Gait training;Stair training;Functional mobility training;Therapeutic activities;Therapeutic exercise;Patient/family education    PT Goals (Current goals can be found in the Care Plan section)  Acute Rehab PT Goals Patient Stated Goal: return to home, resume HHPT PT Goal Formulation: With patient Time For Goal Achievement: 01/23/20 Potential to Achieve Goals: Fair    Frequency Min 2X/week   Barriers to discharge        Co-evaluation               AM-PAC PT "6 Clicks" Mobility  Outcome Measure Help needed turning from your back to your side while in a flat bed without using bedrails?: None Help needed moving from lying on your back to sitting on the side of a flat bed without using bedrails?: None Help needed moving to and from a bed to a chair (including a wheelchair)?: A Little Help needed standing up from a chair using your arms (e.g., wheelchair or bedside chair)?: A Little Help needed to walk in hospital room?: A Little Help needed climbing 3-5 steps with a railing? : A Little 6 Click Score: 20    End of Session Equipment Utilized During Treatment: Gait belt Activity Tolerance: Patient tolerated treatment well;Patient limited by fatigue Patient left: in bed;with call bell/phone within reach Nurse Communication: Mobility status PT Visit Diagnosis: Unsteadiness on feet (R26.81);Other abnormalities of gait and mobility (R26.89);Difficulty in walking, not elsewhere classified (R26.2)    Time: 1333-1400 PT Time Calculation (min) (ACUTE ONLY): 27 min   Charges:   PT Evaluation $PT Eval Moderate Complexity: 1 Mod PT Treatments $Therapeutic  Exercise: 8-22 mins        2:16 PM, 01/09/20 Etta Grandchild, PT, DPT Physical Therapist - [facility]  [phone] ([place])   [place] C 01/09/2020, 2:14 PM

## 2020-01-09 NOTE — TOC Initial Note (Signed)
Transition of Care [facility]) - Initial/Assessment Note    Patient Details  Name: Carlos [name] MRN: [id number] Date of Birth: February 07, 1927  Transition of Care [facility]) CM/SW Contact:    Ova Freshwater Phone Number: [phone] 01/09/2020, 1:31 PM  Clinical Narrative:                  CSW spoke with the patient and explained the role of TOC department in patient care.  Patient stated he is able to perform all ADLs without assistance. Patient stated he does use a walker at home. Patient is able to drive himself to doctor's appointment and has PCP.  Patient has his medications delivered to his home and stated he is taking them as directed.  Patient stated he drinks "at least two glasses of wine everyday."  Patient stated is will not consider SNF placement even if it recommended, and that he is active with [facility], with Pt and RN.  CSW spoke with patient's daughter Victorino December [phone], for collateral information and she confirmed what the patient stated about ADLs and home health.  CSW reached out to [facility] and confirmed patient is active with PT/RN.  Expected Discharge Plan: [place]     Patient Goals and CMS Choice Patient states their goals for this hospitalization and ongoing recovery are:: Patietn states he does not want to go to SNF, he wants to return home with home health.      Expected Discharge Plan and Services Expected Discharge Plan: [place] In-house Referral: Clinical Social Work   Post Acute Care Choice: Home Health (PT/RN) Living arrangements for the past 2 months: [place]                                      Prior Living Arrangements/Services Living arrangements for the past 2 months: Single Family Home Lives with:: Self Patient language and need for interpreter reviewed:: Yes Do you feel safe going back to the place where you live?: Yes      Need for Family Participation in Patient Care:  No (Comment) Care giver support system in place?: Yes (comment) Current home services: Home PT, Home RN Criminal Activity/Legal Involvement Pertinent to Current Situation/Hospitalization: No - Comment as needed  Activities of Daily Living      Permission Sought/Granted Permission sought to share information with : Family Supports Permission granted to share information with : Yes, Verbal Permission Granted  Share Information with NAME: Lucky Cowboy [phone]           Emotional Assessment Appearance:: Appears younger than stated age Attitude/Demeanor/Rapport: Engaged Affect (typically observed): Stable Orientation: : Oriented to Self, Oriented to Place, Oriented to  Time, Oriented to Situation Alcohol / Substance Use: Alcohol Use (Patietn stated he "drinks at least two glasses of wine everyday.") Psych Involvement: No (comment)  Admission diagnosis:  fall EMS Patient Active Problem List   Diagnosis Date Noted  . Anemia of chronic disease 09/10/2019  . 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 Acute on chronic combined systolic and diastolic CHF (congestive heart failure) ([place]) 05/23/2019  . Acute pain of left shoulder 09/08/2015  . CKD (chronic kidney disease), stage IV ([place]) 12/24/2014  . Bradycardia 12/02/2014  . TI (tricuspid incompetence) 10/21/2014  .  DD (diverticular disease) 07/30/2014  . Essential (primary) hypertension 07/30/2014  . Tuberculosis 07/30/2014  . Hyperlipidemia 07/30/2014  . Chronic systolic CHF (congestive heart failure), NYHA class 2 ([place]) 02/12/2014  . AF (paroxysmal atrial fibrillation) ([place]) 02/12/2014  . Paroxysmal A-fib ([place]) 02/12/2014  . Accumulation of fluid in tissues 07/24/2013  . Acid reflux 07/24/2013  . Gout 07/24/2013  . Diabetes mellitus, type 2 ([place]) 07/24/2013  . Type 2 diabetes mellitus with renal  manifestations, controlled ([place]) 07/24/2013  . Peripheral blood vessel disorder ([place]) 07/19/2013  . PVD (peripheral vascular disease) ([place]) 07/19/2013  . Combined fat and carbohydrate induced hyperlipemia 05/21/2013  . Encounter for therapeutic drug monitoring 12/18/2012  . Chronic kidney disease (CKD), stage IV (severe) ([place]) 12/18/2012  . Arteriosclerosis of coronary artery 12/18/2012  . Decreased potassium in the blood 12/18/2012  . H/O coronary artery bypass surgery 12/18/2012  . H/O total knee replacement 12/18/2012  . Encounter for therapeutic drug level monitoring 12/18/2012   PCP:  [name], MD Pharmacy:   [place], [place] - [address] [place] [address] Phone: [phone] Fax: [phone]  [facility] - [place] [place] [address] Phone: [phone] Fax: [phone]     Social Determinants of Health (SDOH) Interventions    Readmission Risk Interventions No flowsheet data found.

## 2020-01-09 NOTE — ED Provider Notes (Signed)
-----------------------------------------   5:29 AM on 01/09/2020 -----------------------------------------  No events overnight.  Patient was held in the ED overnight due to having no way to get home as he lives alone and cannot drive.  Nurse noted overnight that patient is unsteady on his feet while assisting him to use the urinal.  Given that patient lives independently, is on Coumadin, having recurrent falls with now a small and stable SDH, will consult TOC and PT.   Paulette Blanch, MD 01/09/20 [phone]

## 2020-01-09 NOTE — ED Notes (Addendum)
Pt assisted to use urinal   Pt with difficulty moving from supine to seated position. Pt noted to be unsteady on feet, requiring assistance to maintain balance.

## 2020-01-09 NOTE — ED Notes (Signed)
Pt assisted to use urinal.  

## 2020-01-09 NOTE — ED Notes (Signed)
Pt given breakfast tray and is sitting on the edge of bed eating.

## 2020-01-09 NOTE — ED Notes (Signed)
PT at bedside working with patient.

## 2020-01-09 NOTE — ED Notes (Signed)
EDP notified of BP trend

## 2020-01-09 NOTE — ED Notes (Signed)
Pt's daughter Manuela Schwartz updated about pt's discharge.

## 2020-01-09 NOTE — ED Notes (Signed)
Pharmacy contacted to complete med rec on patient.

## 2020-01-10 ENCOUNTER — Other Ambulatory Visit: Payer: Self-pay | Admitting: Neurosurgery

## 2020-01-10 DIAGNOSIS — S065XAA Traumatic subdural hemorrhage with loss of consciousness status unknown, initial encounter: Secondary | ICD-10-CM

## 2020-01-10 DIAGNOSIS — S065X9A Traumatic subdural hemorrhage with loss of consciousness of unspecified duration, initial encounter: Secondary | ICD-10-CM

## 2020-01-28 ENCOUNTER — Ambulatory Visit
Admission: RE | Admit: 2020-01-28 | Discharge: 2020-01-28 | Disposition: A | Discharge status: Home / Self Care | Payer: Medicare Other | Source: Ambulatory Visit | Attending: Neurosurgery | Admitting: Neurosurgery

## 2020-01-28 ENCOUNTER — Other Ambulatory Visit: Payer: Self-pay

## 2020-01-28 DIAGNOSIS — S065XAA Traumatic subdural hemorrhage with loss of consciousness status unknown, initial encounter: Secondary | ICD-10-CM

## 2020-01-28 DIAGNOSIS — S065X9A Traumatic subdural hemorrhage with loss of consciousness of unspecified duration, initial encounter: Secondary | ICD-10-CM | POA: Diagnosis not present

## 2020-03-12 ENCOUNTER — Telehealth: Payer: Self-pay | Admitting: Family

## 2020-03-12 ENCOUNTER — Ambulatory Visit: Payer: Medicare Other | Admitting: Family

## 2020-03-12 NOTE — Telephone Encounter (Signed)
Patient did not show for his Heart Failure Clinic appointment on 03/12/20. Will attempt to reschedule.

## 2020-03-12 NOTE — Progress Notes (Deleted)
Patient ID: Carlos [name], male    DOB: 19-Sep-1926, 87 y.o.   MRN: [id number]  Carlos [name] is a 87 y/o male with a history of CAD, DM, hyperlipidemia, HTN, CKD, PVD, GERD, previous TB, atrial fibrillation, previous tobacco use and chronic heart failure.   Echo report from 05/24/19 reviewed and showed an EF of 60-65% along with mild Carlos.   Was in the ED 01/08/20 due to persistent cough and mechanical fall. Fell in the parking lot at [employer] office and hit the back of his head. Head CT showed small stable SDH. Social work and PT consults obtained. SNF recommended but patient declined and he was released with home health. Was in the ED 09/16/19 due to a cough. Evaluated and released. Admitted 09/10/19 due to bilateral pedal edema. Initially given IV lasix with transition to oral diuretics. Legs wrapped in ACE bandages. Discharged after 2 days.   He presents today for a follow-up visit with a chief complaint of   Past Medical History:  Diagnosis Date  . Arthritis   . Atrial fibrillation ([place])   . CHF (congestive heart failure) ([place])   . Chronic kidney disease    kidney stones  . Coronary artery disease   . Diabetes mellitus without complication ([place])   . Diverticulosis   . GERD (gastroesophageal reflux disease)   . Gout   . Hyperlipidemia   . Hypertension   . Myocardial infarction ([place]) 2001  . Peripheral vascular disease ([place])   . Pneumothorax, left 1950   Past Surgical History:  Procedure Laterality Date  . CARDIAC CATHETERIZATION    . CARDIAC SURGERY     4 bypass  . CHOLECYSTECTOMY    . CORONARY ARTERY BYPASS GRAFT    . CYSTOSCOPY W/ RETROGRADES N/A 12/03/2014   Procedure: CYSTOSCOPY WITH ATTEMPT FOR RETROGRADE PYELOGRAM;  Surgeon: Carlos Retort, MD;  Location: [facility] ORS;  Service: Urology;  Laterality: N/A;  . CYSTOSCOPY WITH LITHOLAPAXY N/A 12/03/2014   Procedure: CYSTOSCOPY WITH LITHOLAPAXY WITH HOLMIUM LASER ;  Surgeon: Carlos Retort, MD;  Location: [facility] ORS;  Service:  Urology;  Laterality: N/A;  . FEMUR SURGERY    . GALLBLADDER SURGERY    . HERNIA REPAIR    . JOINT REPLACEMENT Right    Total Knee Replacement  . LUNG SURGERY Left   . REPLACEMENT TOTAL KNEE     Family History  Problem Relation Age of Onset  . Lung cancer Mother   . Tuberculosis Brother    Social History   Tobacco Use  . Smoking status: Former Smoker    Types: Pipe    Quit date: 07/30/2006    Years since quitting: 13.6  . Smokeless tobacco: Never Used  Substance Use Topics  . Alcohol use: Yes    Alcohol/week: 14.0 standard drinks    Types: 14 Glasses of wine per week    Comment: one glass of wine daily   Allergies  Allergen Reactions  . Morphine And Related Other (See Comments)    "nightmares" (Patient had Morphine and tolerated it fine)      Review of Systems  Constitutional: Positive for fatigue. Negative for activity change and appetite change.  HENT: Negative for congestion, postnasal drip and sore throat.   Eyes: Positive for redness (left eye).       Left eye twitching  Respiratory: Positive for shortness of breath (with moderate exertion). Negative for cough, chest tightness and wheezing.   Cardiovascular: Positive for leg swelling (R>L). Negative for chest pain  and palpitations.  Gastrointestinal: Negative for abdominal distention and abdominal pain.  Endocrine: Negative.   Genitourinary: Negative.   Musculoskeletal: Negative for arthralgias and back pain.  Skin: Negative.   Allergic/Immunologic: Negative.   Neurological: Negative for dizziness, syncope, weakness, light-headedness and headaches.  Hematological: Negative for adenopathy. Bruises/bleeds easily.  Psychiatric/Behavioral: Positive for sleep disturbance (chronic difficulty sleeping). Negative for dysphoric mood. The patient is not nervous/anxious.       Physical Exam Vitals and nursing note reviewed.  Constitutional:      General: He is not in acute distress.    Appearance: Normal appearance.  He is not ill-appearing, toxic-appearing or diaphoretic.  HENT:     Head: Normocephalic and atraumatic.  Cardiovascular:     Rate and Rhythm: Normal rate and regular rhythm.  Pulmonary:     Effort: Pulmonary effort is normal. No respiratory distress.     Breath sounds: No wheezing or rales.  Abdominal:     General: There is no distension.     Palpations: Abdomen is soft.  Musculoskeletal:        General: No tenderness.     Cervical back: Normal range of motion and neck supple.     Right lower leg: Edema (3+ pitting although softer) present.     Left lower leg: Edema (Trace pitting) present.  Skin:    General: Skin is warm and dry.  Neurological:     General: No focal deficit present.     Mental Status: He is alert and oriented to person, place, and time.  Psychiatric:        Mood and Affect: Mood normal.        Behavior: Behavior normal.        Thought Content: Thought content normal.    Assessment & Plan:  1: Chronic heart failure with preserved ejection fraction without structural changes- - NYHA class II - continued pedal edema although improved overall - weighing daily; reminded to call for an overnight weight gain of >2 pounds or a weekly weight gain of >5 pounds - weight 209.6pounds since last visit here 3 months ago - not adding salt and tries to follow a low sodium diet. Reports he eats sausage and eggs every morning and has a beer or glass of wine for dinner. - saw cardiology Carlos [name]) 11/13/19 - BNP 01/08/20 was 189.0 - has received both his COVID vaccines  2: HTN- - BP  - saw PCP (Carlos [name]) 01/31/20 - BMP 01/08/2020 reviewed and showed sodium 139, potassium 3.6, creatinine 2.13 and GFR 29  3: DM- - A1c on 05/23/19 was 6.0% - saw nephrology Carlos [name]) 01/03/20  4: Lymphedema- - stage 2 - admits to not elevating his legs much during the day and he was encouraged to do so - has great difficulty in getting compression socks on  - limited in his ability to exercise  due to symptoms - Consult placed today for lymphedema compression boots  5: Atrial fibrillation- - INR 03/11/19 was 3.4; plan to recheck it again in 2 weeks - currently taking warfarin - had SDH November 2021 after mechanical fall   Patient did not bring his medications nor a list. Each medication was verbally reviewed with the patient and he was encouraged to bring the bottles to every visit to confirm accuracy of list.

## 2020-03-23 ENCOUNTER — Ambulatory Visit: Payer: Medicare Other | Admitting: Family

## 2020-05-06 ENCOUNTER — Other Ambulatory Visit: Payer: Self-pay

## 2020-05-06 ENCOUNTER — Ambulatory Visit
Admission: RE | Admit: 2020-05-06 | Discharge: 2020-05-06 | Disposition: A | Discharge status: Home / Self Care | Payer: Medicare Other | Source: Ambulatory Visit | Attending: Nephrology | Admitting: Nephrology

## 2020-05-06 DIAGNOSIS — R609 Edema, unspecified: Secondary | ICD-10-CM | POA: Diagnosis not present

## 2020-05-06 LAB — RENAL FUNCTION PANEL
Albumin: 3.6 g/dL (ref 3.5–5.0)
Anion gap: 9 (ref 5–15)
BUN: 51 mg/dL — ABNORMAL HIGH (ref 8–23)
CO2: 28 mmol/L (ref 22–32)
Calcium: 8.9 mg/dL (ref 8.9–10.3)
Chloride: 104 mmol/L (ref 98–111)
Creatinine, Ser: 2.08 mg/dL — ABNORMAL HIGH (ref 0.61–1.24)
GFR, Estimated: 30 mL/min — ABNORMAL LOW (ref >60)
Glucose, Bld: 102 mg/dL — ABNORMAL HIGH (ref 70–99)
Phosphorus: 4 mg/dL (ref 2.5–4.6)
Potassium: 4.3 mmol/L (ref 3.5–5.1)
Sodium: 141 mmol/L (ref 135–145)

## 2020-05-06 MED ORDER — FUROSEMIDE 10 MG/ML IJ SOLN
INTRAMUSCULAR | Status: AC
  Filled 2020-05-06: qty 4

## 2020-05-06 MED ORDER — FUROSEMIDE 10 MG/ML IJ SOLN
40.0000 mg | Freq: Once | INTRAMUSCULAR | Status: AC
  Administered 2020-05-06: 40 mg via INTRAVENOUS

## 2020-05-06 MED ORDER — POTASSIUM CHLORIDE CRYS ER 20 MEQ PO TBCR
20.0000 meq | EXTENDED_RELEASE_TABLET | Freq: Once | ORAL | Status: AC
  Administered 2020-05-06: 20 meq via ORAL

## 2020-05-06 MED ORDER — POTASSIUM CHLORIDE CRYS ER 20 MEQ PO TBCR
EXTENDED_RELEASE_TABLET | ORAL | Status: AC
  Filled 2020-05-06: qty 1

## 2020-05-08 ENCOUNTER — Ambulatory Visit
Admission: RE | Admit: 2020-05-08 | Discharge: 2020-05-08 | Disposition: A | Discharge status: Home / Self Care | Payer: Medicare Other | Source: Ambulatory Visit | Attending: Nephrology | Admitting: Nephrology

## 2020-05-08 DIAGNOSIS — R609 Edema, unspecified: Secondary | ICD-10-CM | POA: Diagnosis not present

## 2020-05-08 LAB — RENAL FUNCTION PANEL
Albumin: 3.5 g/dL (ref 3.5–5.0)
Anion gap: 8 (ref 5–15)
BUN: 48 mg/dL — ABNORMAL HIGH (ref 8–23)
CO2: 29 mmol/L (ref 22–32)
Calcium: 9.2 mg/dL (ref 8.9–10.3)
Chloride: 105 mmol/L (ref 98–111)
Creatinine, Ser: 2.15 mg/dL — ABNORMAL HIGH (ref 0.61–1.24)
GFR, Estimated: 28 mL/min — ABNORMAL LOW (ref >60)
Glucose, Bld: 113 mg/dL — ABNORMAL HIGH (ref 70–99)
Phosphorus: 3.6 mg/dL (ref 2.5–4.6)
Potassium: 4.1 mmol/L (ref 3.5–5.1)
Sodium: 142 mmol/L (ref 135–145)

## 2020-05-08 MED ORDER — POTASSIUM CHLORIDE CRYS ER 20 MEQ PO TBCR
EXTENDED_RELEASE_TABLET | ORAL | Status: AC
  Administered 2020-05-08: 20 meq via ORAL
  Filled 2020-05-08: qty 2

## 2020-05-08 MED ORDER — FUROSEMIDE 10 MG/ML IJ SOLN
INTRAMUSCULAR | Status: AC
  Administered 2020-05-08: 40 mg via INTRAVENOUS
  Filled 2020-05-08: qty 8

## 2020-05-08 MED ORDER — POTASSIUM CHLORIDE CRYS ER 20 MEQ PO TBCR: 20.0000 meq | EXTENDED_RELEASE_TABLET | Freq: Once | ORAL | Status: AC

## 2020-05-08 MED ORDER — FUROSEMIDE 10 MG/ML IJ SOLN: 40.0000 mg | Freq: Once | INTRAMUSCULAR | Status: AC

## 2020-05-13 ENCOUNTER — Ambulatory Visit
Admission: RE | Admit: 2020-05-13 | Discharge: 2020-05-13 | Disposition: A | Discharge status: Home / Self Care | Payer: Medicare Other | Source: Ambulatory Visit | Attending: Nephrology | Admitting: Nephrology

## 2020-05-13 ENCOUNTER — Other Ambulatory Visit: Payer: Self-pay

## 2020-05-13 DIAGNOSIS — R609 Edema, unspecified: Secondary | ICD-10-CM | POA: Insufficient documentation

## 2020-05-13 LAB — RENAL FUNCTION PANEL
Albumin: 3.4 g/dL — ABNORMAL LOW (ref 3.5–5.0)
Anion gap: 7 (ref 5–15)
BUN: 35 mg/dL — ABNORMAL HIGH (ref 8–23)
CO2: 26 mmol/L (ref 22–32)
Calcium: 9.2 mg/dL (ref 8.9–10.3)
Chloride: 109 mmol/L (ref 98–111)
Creatinine, Ser: 1.73 mg/dL — ABNORMAL HIGH (ref 0.61–1.24)
GFR, Estimated: 37 mL/min — ABNORMAL LOW (ref >60)
Glucose, Bld: 108 mg/dL — ABNORMAL HIGH (ref 70–99)
Phosphorus: 3.5 mg/dL (ref 2.5–4.6)
Potassium: 4.6 mmol/L (ref 3.5–5.1)
Sodium: 142 mmol/L (ref 135–145)

## 2020-05-13 MED ORDER — POTASSIUM CHLORIDE CRYS ER 20 MEQ PO TBCR: 20.0000 meq | EXTENDED_RELEASE_TABLET | Freq: Once | ORAL | Status: AC

## 2020-05-13 MED ORDER — SODIUM CHLORIDE FLUSH 0.9 % IV SOLN
INTRAVENOUS | Status: AC
  Filled 2020-05-13: qty 10

## 2020-05-13 MED ORDER — FUROSEMIDE 10 MG/ML IJ SOLN: 40.0000 mg | Freq: Once | INTRAMUSCULAR | Status: AC

## 2020-05-13 MED ORDER — POTASSIUM CHLORIDE CRYS ER 20 MEQ PO TBCR
EXTENDED_RELEASE_TABLET | ORAL | Status: AC
  Administered 2020-05-13: 20 meq via ORAL
  Filled 2020-05-13: qty 2

## 2020-05-13 MED ORDER — FUROSEMIDE 10 MG/ML IJ SOLN
INTRAMUSCULAR | Status: AC
  Administered 2020-05-13: 40 mg via INTRAVENOUS
  Filled 2020-05-13: qty 8

## 2020-05-15 ENCOUNTER — Other Ambulatory Visit: Payer: Self-pay

## 2020-05-15 ENCOUNTER — Ambulatory Visit
Admission: RE | Admit: 2020-05-15 | Discharge: 2020-05-15 | Disposition: A | Discharge status: Home / Self Care | Payer: Medicare Other | Source: Ambulatory Visit | Attending: Nephrology | Admitting: Nephrology

## 2020-05-15 DIAGNOSIS — R609 Edema, unspecified: Secondary | ICD-10-CM | POA: Insufficient documentation

## 2020-05-15 LAB — RENAL FUNCTION PANEL
Albumin: 3.6 g/dL (ref 3.5–5.0)
Anion gap: 4 — ABNORMAL LOW (ref 5–15)
BUN: 39 mg/dL — ABNORMAL HIGH (ref 8–23)
CO2: 25 mmol/L (ref 22–32)
Calcium: 9.1 mg/dL (ref 8.9–10.3)
Chloride: 110 mmol/L (ref 98–111)
Creatinine, Ser: 1.92 mg/dL — ABNORMAL HIGH (ref 0.61–1.24)
GFR, Estimated: 32 mL/min — ABNORMAL LOW (ref >60)
Glucose, Bld: 112 mg/dL — ABNORMAL HIGH (ref 70–99)
Phosphorus: 3.3 mg/dL (ref 2.5–4.6)
Potassium: 4.6 mmol/L (ref 3.5–5.1)
Sodium: 139 mmol/L (ref 135–145)

## 2020-05-15 MED ORDER — FUROSEMIDE 10 MG/ML IJ SOLN: 40.0000 mg | Freq: Once | INTRAMUSCULAR | Status: AC

## 2020-05-15 MED ORDER — POTASSIUM CHLORIDE CRYS ER 20 MEQ PO TBCR
EXTENDED_RELEASE_TABLET | ORAL | Status: AC
  Administered 2020-05-15: 20 meq via ORAL
  Filled 2020-05-15: qty 1

## 2020-05-15 MED ORDER — FUROSEMIDE 10 MG/ML IJ SOLN
INTRAMUSCULAR | Status: AC
  Administered 2020-05-15: 40 mg via INTRAVENOUS
  Filled 2020-05-15: qty 4

## 2020-05-15 MED ORDER — POTASSIUM CHLORIDE CRYS ER 20 MEQ PO TBCR: 20.0000 meq | EXTENDED_RELEASE_TABLET | Freq: Two times a day (BID) | ORAL | Status: DC

## 2020-07-28 ENCOUNTER — Ambulatory Visit
Admission: EM | Admit: 2020-07-28 | Discharge: 2020-07-28 | Disposition: A | Discharge status: Home / Self Care | Payer: Medicare Other | Attending: Internal Medicine | Admitting: Internal Medicine

## 2020-07-28 ENCOUNTER — Other Ambulatory Visit: Payer: Self-pay

## 2020-07-28 DIAGNOSIS — M25511 Pain in right shoulder: Secondary | ICD-10-CM | POA: Diagnosis not present

## 2020-07-28 DIAGNOSIS — M7501 Adhesive capsulitis of right shoulder: Secondary | ICD-10-CM

## 2020-07-28 MED ORDER — TRAMADOL HCL 50 MG PO TABS: 50.0000 mg | ORAL_TABLET | Freq: Four times a day (QID) | ORAL | 0 refills | Status: DC | PRN

## 2020-07-28 NOTE — ED Provider Notes (Signed)
[facility]    CSN: [id number] Arrival date & time: 07/28/20  1558      History   Chief Complaint Chief Complaint  Patient presents with  . Arm Pain    right    HPI Carlos [name] is a 87 y.o. male who presents with complaining of R shoulder pain x 1 week. Denies injuring himself, but admits he only sleeps on his R side. Has tried Voltaren gel and is not helping. Pain is worse with trying to raise it up which he cant do because of pain. Has had arthroscopic surgeries of both shoulders, but does not know the diagnosis. Denies paresthesia or weakness. He cant get in to see his orthopedist til mid June.     Past Medical History:  Diagnosis Date  . Arthritis   . Atrial fibrillation ([place])   . CHF (congestive heart failure) ([place])   . Chronic kidney disease    kidney stones  . Coronary artery disease   . Diabetes mellitus without complication ([place])   . Diverticulosis   . GERD (gastroesophageal reflux disease)   . Gout   . Hyperlipidemia   . Hypertension   . Myocardial infarction ([place]) 2001  . Peripheral vascular disease ([place])   . Pneumothorax, left 1950    Patient Active Problem List   Diagnosis Date Noted  . Anemia of chronic disease 09/10/2019  . 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 Acute on chronic combined systolic and diastolic CHF (congestive heart failure) ([place]) 05/23/2019  . Acute pain of left shoulder 09/08/2015  . CKD (chronic kidney disease), stage IV ([place]) 12/24/2014  . Bradycardia 12/02/2014  . TI (tricuspid incompetence) 10/21/2014  . DD (diverticular disease) 07/30/2014  . Essential (primary) hypertension 07/30/2014  . Tuberculosis 07/30/2014  . Hyperlipidemia 07/30/2014  . Chronic systolic CHF (congestive heart failure), NYHA class 2 ([place]) 02/12/2014  . AF (paroxysmal atrial fibrillation) ([place]) 02/12/2014  . Paroxysmal  A-fib ([place]) 02/12/2014  . Accumulation of fluid in tissues 07/24/2013  . Acid reflux 07/24/2013  . Gout 07/24/2013  . Diabetes mellitus, type 2 ([place]) 07/24/2013  . Type 2 diabetes mellitus with renal manifestations, controlled ([place]) 07/24/2013  . Peripheral blood vessel disorder ([place]) 07/19/2013  . PVD (peripheral vascular disease) ([place]) 07/19/2013  . Combined fat and carbohydrate induced hyperlipemia 05/21/2013  . Encounter for therapeutic drug monitoring 12/18/2012  . Chronic kidney disease (CKD), stage IV (severe) ([place]) 12/18/2012  . Arteriosclerosis of coronary artery 12/18/2012  . Decreased potassium in the blood 12/18/2012  . H/O coronary artery bypass surgery 12/18/2012  . H/O total knee replacement 12/18/2012  . Encounter for therapeutic drug level monitoring 12/18/2012    Past Surgical History:  Procedure Laterality Date  . CARDIAC CATHETERIZATION    . CARDIAC SURGERY     4 bypass  . CHOLECYSTECTOMY    . CORONARY ARTERY BYPASS GRAFT    . CYSTOSCOPY W/ RETROGRADES N/A 12/03/2014   Procedure: CYSTOSCOPY WITH ATTEMPT FOR RETROGRADE PYELOGRAM;  Surgeon: [name], MD;  Location: [facility] ORS;  Service: Urology;  Laterality: N/A;  . CYSTOSCOPY WITH LITHOLAPAXY N/A 12/03/2014   Procedure: CYSTOSCOPY WITH LITHOLAPAXY WITH HOLMIUM LASER ;  Surgeon: [name], MD;  Location: [facility] ORS;  Service: Urology;  Laterality: N/A;  . FEMUR SURGERY    . GALLBLADDER SURGERY    . HERNIA REPAIR    . JOINT REPLACEMENT  Right    Total Knee Replacement  . LUNG SURGERY Left   . REPLACEMENT TOTAL KNEE         Home Medications    Prior to Admission medications   Medication Sig Start Date End Date Taking? Authorizing Provider  allopurinol (ZYLOPRIM) 300 MG tablet Take 150 mg by mouth daily.  05/09/14  Yes [provider]  aspirin EC 81 MG tablet Take 81 mg by mouth at bedtime.    Yes [provider]  ferrous sulfate 325 (65 FE) MG tablet Take 1 tablet (325 mg  total) by mouth daily. 09/12/19 11/29/19 Yes Sharen Hones, MD  finasteride (PROSCAR) 5 MG tablet Take 1 tablet (5 mg total) by mouth daily. 08/13/18  Yes Billey Co, MD  loratadine (CLARITIN) 10 MG tablet Take 10 mg by mouth daily as needed. 07/16/19  Yes [provider]  losartan (COZAAR) 25 MG tablet Take 25 mg by mouth daily. 05/10/19  Yes [provider]  metoprolol succinate (TOPROL-XL) 25 MG 24 hr tablet Take 25 mg by mouth daily. 05/10/19  Yes [provider]  Multiple Vitamin (MULTI-VITAMINS) TABS Take 1 tablet by mouth daily.    Yes [provider]  Omega-3 Fatty Acids (FISH OIL) 1000 MG CAPS Take 1 capsule by mouth daily.   Yes [provider]  simvastatin (ZOCOR) 40 MG tablet Take 40 mg by mouth daily at 6 PM.  07/07/14  Yes [provider]  tamsulosin (FLOMAX) 0.4 MG CAPS capsule Take 1 capsule (0.4 mg total) by mouth daily. 08/13/18  Yes Billey Co, MD  torsemide (DEMADEX) 20 MG tablet Take 2 tablets (40 mg total) by mouth daily. 05/28/19 11/29/19 Yes Sreenath, Sudheer B, MD  traMADol (ULTRAM) 50 MG tablet Take 1 tablet (50 mg total) by mouth every 6 (six) hours as needed. Prn pain 07/28/20  Yes Rodriguez-Southworth, Sunday Spillers, PA-C  acetaminophen (TYLENOL) 325 MG tablet Take 2 tablets (650 mg total) by mouth every 6 (six) hours as needed for headache. 01/08/20 01/07/21  Duffy Bruce, MD  colchicine 0.6 MG tablet Take 0.6 mg by mouth as needed.   08/28/18  [provider]    Family History Family History  Problem Relation Age of Onset  . Lung cancer Mother   . Tuberculosis Brother     Social History Social History   Tobacco Use  . Smoking status: Former Smoker    Types: Pipe    Quit date: 07/30/2006    Years since quitting: 14.0  . Smokeless tobacco: Never Used  Vaping Use  . Vaping Use: Never used  Substance Use Topics  . Alcohol use: Yes    Alcohol/week: 14.0 standard drinks    Types: 14 Glasses of wine per  week    Comment: one glass of wine daily  . Drug use: No     Allergies   Morphine and related   Review of Systems Review of Systems  Musculoskeletal: Positive for arthralgias and gait problem.  Skin: Negative for color change, rash and wound.  Neurological: Negative for weakness and numbness.     Physical Exam Triage Vital Signs ED Triage Vitals  Enc Vitals Group     BP 07/28/20 1657 123/88     Pulse Rate 07/28/20 1657 64     Resp 07/28/20 1657 18     Temp 07/28/20 1657 98.2 F (36.8 C)     Temp Source 07/28/20 1657 Oral     SpO2 07/28/20 1657 97 %  Weight 07/28/20 1650 209 lb (94.8 kg)     Height 07/28/20 1650 '5\' 7"'$  (1.702 m)     Head Circumference --      Peak Flow --      Pain Score 07/28/20 1650 10     Pain Loc --      Pain Edu? --      Excl. in [place]? --    No data found.  Updated Vital Signs BP 123/88 (BP Location: Left Arm)   Pulse 64   Temp 98.2 F (36.8 C) (Oral)   Resp 18   Ht '5\' 7"'$  (1.702 m)   Wt 209 lb (94.8 kg)   SpO2 97%   BMI 32.73 kg/m   Visual Acuity Right Eye Distance:   Left Eye Distance:   Bilateral Distance:    Right Eye Near:   Left Eye Near:    Bilateral Near:     Physical Exam Vitals and nursing note reviewed.  Constitutional:      General: He is not in acute distress.    Appearance: He is obese. He is not toxic-appearing.     Comments: Uses a walker  HENT:     Head: Normocephalic.     Right Ear: External ear normal.     Left Ear: External ear normal.  Eyes:     General: No scleral icterus.    Conjunctiva/sclera: Conjunctivae normal.  Pulmonary:     Effort: Pulmonary effort is normal.  Musculoskeletal:     Cervical back: Neck supple.     Comments: R UPPER EXTREMITY- L anterior shoulder around bicep area looks mildly swollen compared to the L and is tender on the bicep tendon region, as well as the deltoid and distal bicep but much less. He is unable to raise it more than 30 degrees due to pain. Able to tolerate  passive ROM but his shoulder feels stiff and partially frozen.   Neurological:     Mental Status: He is alert and oriented to person, place, and time.     Gait: Gait abnormal.     Deep Tendon Reflexes: Reflexes normal.  Psychiatric:        Mood and Affect: Mood normal.        Behavior: Behavior normal.        Thought Content: Thought content normal.        Judgment: Judgment normal.      UC Treatments / Results  Labs (all labs ordered are listed, but only abnormal results are displayed) Labs Reviewed - No data to display  EKG   Radiology No results found.  Procedures Procedures (including critical care time)  Medications Ordered in UC Medications - No data to display  Initial Impression / Assessment and Plan / UC Course  I have reviewed the triage vital signs and the nursing notes. R boiep tendonitis with frozen shoulder.  I placed him on Tramadol which he has taken before and is able to tolerate this fine. I taught him to do stretches on the wall to prevent further freezing of his shoulder. Needs to see PT.   Final Clinical Impressions(s) / UC Diagnoses   Final diagnoses:  Acute pain of right shoulder  Adhesive capsulitis of right shoulder     Discharge Instructions     Try to avoid sleeping on your right side for too long, and take brakes sleeping on the recliner and do wall stretches.    Follow up with orthopedics next week     ED  Prescriptions    Medication Sig Dispense Auth. Provider   traMADol (ULTRAM) 50 MG tablet Take 1 tablet (50 mg total) by mouth every 6 (six) hours as needed. Prn pain 15 tablet Rodriguez-Southworth, Sunday Spillers, PA-C     I have reviewed the PDMP during this encounter.   Shelby Mattocks, [place] 07/28/20 1916

## 2020-07-28 NOTE — ED Triage Notes (Signed)
Patient states that he has been having right arm pain that has been worsening over the last few days. Patient denies any known injury.

## 2020-07-28 NOTE — Discharge Instructions (Addendum)
Try to avoid sleeping on your right side for too long, and take brakes sleeping on the recliner and do wall stretches.    Follow up with orthopedics next week

## 2020-10-13 ENCOUNTER — Emergency Department: Payer: Medicare Other

## 2020-10-13 ENCOUNTER — Other Ambulatory Visit: Payer: Self-pay

## 2020-10-13 ENCOUNTER — Emergency Department
Admission: EM | Admit: 2020-10-13 | Discharge: 2020-10-13 | Disposition: A | Discharge status: Home / Self Care | Payer: Medicare Other | Attending: Emergency Medicine | Admitting: Emergency Medicine

## 2020-10-13 DIAGNOSIS — N184 Chronic kidney disease, stage 4 (severe): Secondary | ICD-10-CM | POA: Insufficient documentation

## 2020-10-13 DIAGNOSIS — E1122 Type 2 diabetes mellitus with diabetic chronic kidney disease: Secondary | ICD-10-CM | POA: Diagnosis not present

## 2020-10-13 DIAGNOSIS — Z96651 Presence of right artificial knee joint: Secondary | ICD-10-CM | POA: Diagnosis not present

## 2020-10-13 DIAGNOSIS — G8929 Other chronic pain: Secondary | ICD-10-CM | POA: Diagnosis not present

## 2020-10-13 DIAGNOSIS — I5043 Acute on chronic combined systolic (congestive) and diastolic (congestive) heart failure: Secondary | ICD-10-CM | POA: Diagnosis not present

## 2020-10-13 DIAGNOSIS — Z79899 Other long term (current) drug therapy: Secondary | ICD-10-CM | POA: Insufficient documentation

## 2020-10-13 DIAGNOSIS — M129 Arthropathy, unspecified: Secondary | ICD-10-CM | POA: Diagnosis not present

## 2020-10-13 DIAGNOSIS — M12811 Other specific arthropathies, not elsewhere classified, right shoulder: Secondary | ICD-10-CM

## 2020-10-13 DIAGNOSIS — Z87891 Personal history of nicotine dependence: Secondary | ICD-10-CM | POA: Diagnosis not present

## 2020-10-13 DIAGNOSIS — Z7982 Long term (current) use of aspirin: Secondary | ICD-10-CM | POA: Diagnosis not present

## 2020-10-13 DIAGNOSIS — I2510 Atherosclerotic heart disease of native coronary artery without angina pectoris: Secondary | ICD-10-CM | POA: Insufficient documentation

## 2020-10-13 DIAGNOSIS — I130 Hypertensive heart and chronic kidney disease with heart failure and stage 1 through stage 4 chronic kidney disease, or unspecified chronic kidney disease: Secondary | ICD-10-CM | POA: Insufficient documentation

## 2020-10-13 DIAGNOSIS — Z951 Presence of aortocoronary bypass graft: Secondary | ICD-10-CM | POA: Diagnosis not present

## 2020-10-13 DIAGNOSIS — M25511 Pain in right shoulder: Secondary | ICD-10-CM | POA: Diagnosis present

## 2020-10-13 NOTE — Discharge Instructions (Addendum)
Your x-ray does not show new injury. Please follow up with orthopedics or Dr. Ellison Hughs.  Take tylenol if needed for pain.  Return to the ER for symptoms that change or worsen if unable to schedule an appointment.

## 2020-10-13 NOTE — ED Provider Notes (Signed)
[facility] Emergency Department Provider Note ____________________________________________  Time seen: Approximately 10:29 PM  I have reviewed the triage vital signs and the nursing notes.   HISTORY  Chief Complaint Shoulder Pain    HPI Carlos [name] is a 87 y.o. male who presents to the emergency department for evaluation and treatment of right shoulder pain.  Patient has known "shoulder separation" and was scheduled to have surgical intervention in July but decided that due to his age not to go through with it.  He states that today he moved his arm in a way that caused sudden and intense pain that traveled from the shoulder into the upper arm.  This lasted for several hours.  No relief with Tylenol.  Past Medical History:  Diagnosis Date   Arthritis    Atrial fibrillation (HCC)    CHF (congestive heart failure) ([place])    Chronic kidney disease    kidney stones   Coronary artery disease    Diabetes mellitus without complication (HCC)    Diverticulosis    GERD (gastroesophageal reflux disease)    Gout    Hyperlipidemia    Hypertension    Myocardial infarction [facility]) 2001   Peripheral vascular disease ([place])    Pneumothorax, left 1950    Patient Active Problem List   Diagnosis Date Noted   Anemia of chronic disease 09/10/2019   Edema of both lower legs due to peripheral venous insufficiency 09/10/2019   Edema of both lower extremities due to peripheral venous insufficiency 09/10/2019   Acute on chronic combined systolic and diastolic CHF (congestive heart failure) ([place]) 05/23/2019   Acute pain of left shoulder 09/08/2015   CKD (chronic kidney disease), stage IV ([place]) 12/24/2014   Bradycardia 12/02/2014   TI (tricuspid incompetence) 10/21/2014   DD (diverticular disease) 07/30/2014   Essential (primary) hypertension 07/30/2014   Tuberculosis 07/30/2014   Hyperlipidemia 0000000   Chronic systolic CHF (congestive heart failure), NYHA class 2 ([place])  02/12/2014   AF (paroxysmal atrial fibrillation) ([place]) 02/12/2014   Paroxysmal A-fib ([place]) 02/12/2014   Accumulation of fluid in tissues 07/24/2013   Acid reflux 07/24/2013   Gout 07/24/2013   Diabetes mellitus, type 2 ([place]) 07/24/2013   Type 2 diabetes mellitus with renal manifestations, controlled ([place]) 07/24/2013   Peripheral blood vessel disorder ([place]) 07/19/2013   PVD (peripheral vascular disease) ([place]) 07/19/2013   Combined fat and carbohydrate induced hyperlipemia 05/21/2013   Encounter for therapeutic drug monitoring 12/18/2012   Chronic kidney disease (CKD), stage IV (severe) ([place]) 12/18/2012   Arteriosclerosis of coronary artery 12/18/2012   Decreased potassium in the blood 12/18/2012   H/O coronary artery bypass surgery 12/18/2012   H/O total knee replacement 12/18/2012   Encounter for therapeutic drug level monitoring 12/18/2012    Past Surgical History:  Procedure Laterality Date   CARDIAC CATHETERIZATION     CARDIAC SURGERY     4 bypass   CHOLECYSTECTOMY     CORONARY ARTERY BYPASS GRAFT     CYSTOSCOPY W/ RETROGRADES N/A 12/03/2014   Procedure: CYSTOSCOPY WITH ATTEMPT FOR RETROGRADE PYELOGRAM;  Surgeon: [name], MD;  Location: [facility] ORS;  Service: Urology;  Laterality: N/A;   CYSTOSCOPY WITH LITHOLAPAXY N/A 12/03/2014   Procedure: CYSTOSCOPY WITH LITHOLAPAXY WITH HOLMIUM LASER ;  Surgeon: [name], MD;  Location: [facility] ORS;  Service: Urology;  Laterality: N/A;   FEMUR SURGERY     GALLBLADDER SURGERY     HERNIA REPAIR     JOINT REPLACEMENT Right  Total Knee Replacement   LUNG SURGERY Left    REPLACEMENT TOTAL KNEE      Prior to Admission medications   Medication Sig Start Date End Date Taking? Authorizing Provider  acetaminophen (TYLENOL) 325 MG tablet Take 2 tablets (650 mg total) by mouth every 6 (six) hours as needed for headache. 01/08/20 01/07/21  Duffy Bruce, MD  allopurinol (ZYLOPRIM) 300 MG tablet Take 150 mg by mouth daily.   05/09/14   [provider]  aspirin EC 81 MG tablet Take 81 mg by mouth at bedtime.     [provider]  ferrous sulfate 325 (65 FE) MG tablet Take 1 tablet (325 mg total) by mouth daily. 09/12/19 11/29/19  Sharen Hones, MD  finasteride (PROSCAR) 5 MG tablet Take 1 tablet (5 mg total) by mouth daily. 08/13/18   Billey Co, MD  loratadine (CLARITIN) 10 MG tablet Take 10 mg by mouth daily as needed. 07/16/19   [provider]  losartan (COZAAR) 25 MG tablet Take 25 mg by mouth daily. 05/10/19   [provider]  metoprolol succinate (TOPROL-XL) 25 MG 24 hr tablet Take 25 mg by mouth daily. 05/10/19   [provider]  Multiple Vitamin (MULTI-VITAMINS) TABS Take 1 tablet by mouth daily.     [provider]  Omega-3 Fatty Acids (FISH OIL) 1000 MG CAPS Take 1 capsule by mouth daily.    [provider]  simvastatin (ZOCOR) 40 MG tablet Take 40 mg by mouth daily at 6 PM.  07/07/14   [provider]  tamsulosin (FLOMAX) 0.4 MG CAPS capsule Take 1 capsule (0.4 mg total) by mouth daily. 08/13/18   Billey Co, MD  torsemide (DEMADEX) 20 MG tablet Take 2 tablets (40 mg total) by mouth daily. 05/28/19 11/29/19  Sidney Ace, MD  traMADol (ULTRAM) 50 MG tablet Take 1 tablet (50 mg total) by mouth every 6 (six) hours as needed. Prn pain 07/28/20   Rodriguez-Southworth, Sunday Spillers, PA-C  colchicine 0.6 MG tablet Take 0.6 mg by mouth as needed.   08/28/18  [provider]    Allergies Patient has no active allergies.  Family History  Problem Relation Age of Onset   Lung cancer Mother    Tuberculosis Brother     Social History Social History   Tobacco Use   Smoking status: Former    Types: Pipe    Quit date: 07/30/2006    Years since quitting: 14.2   Smokeless tobacco: Never  Vaping Use   Vaping Use: Never used  Substance Use Topics   Alcohol use: Yes    Alcohol/week: 14.0 standard drinks    Types: 14 Glasses of wine  per week    Comment: one glass of wine daily   Drug use: No    Review of Systems Constitutional: Negative for fever. Cardiovascular: Negative for chest pain. Respiratory: Negative for shortness of breath. Musculoskeletal: Positive for right shoulder pain. Skin: Negative for open wounds or lesions. Neurological: Negative for decrease in sensation  ____________________________________________   PHYSICAL EXAM:  VITAL SIGNS: ED Triage Vitals  Enc Vitals Group     BP 10/13/20 2013 (!) 119/53     Pulse Rate 10/13/20 2013 62     Resp 10/13/20 2013 16     Temp 10/13/20 2013 98.9 F (37.2 C)     Temp Source 10/13/20 2013 Oral     SpO2 10/13/20 2013 95 %     Weight 10/13/20 2011 207 lb (93.9 kg)  Height 10/13/20 2011 '5\' 7"'$  (1.702 m)     Head Circumference --      Peak Flow --      Pain Score 10/13/20 2113 8     Pain Loc --      Pain Edu? --      Excl. in [place]? --     Constitutional: Alert and oriented. Well appearing and in no acute distress. Eyes: Conjunctivae are clear without discharge or drainage Head: Atraumatic Neck: Supple. Respiratory: No cough. Respirations are even and unlabored. Musculoskeletal: Diffuse tenderness over the right shoulder.  Patient is able to demonstrate range of motion but has pain with external rotation.  Grip strength is equal. Neurologic: Motor and sensory function is intact. Skin: No open wounds or lesions. Psychiatric: Affect and behavior are appropriate.  ____________________________________________   LABS (all labs ordered are listed, but only abnormal results are displayed)  Labs Reviewed - No data to display ____________________________________________  RADIOLOGY  No new findings of right shoulder based on x-ray  I, Avier Jech, personally viewed and evaluated these images (plain radiographs) as part of my medical decision making, as well as reviewing the written report by the radiologist.  DG Shoulder Right  Result Date:  10/13/2020 CLINICAL DATA:  History of shoulder pain and degenerative change, initial encounter EXAM: RIGHT SHOULDER - 2+ VIEW COMPARISON:  07/31/2020 by report only FINDINGS: Degenerative changes of the acromioclavicular and glenohumeral joints are seen. The humeral head is mildly high-riding which may represent chronic rotator cuff injury. No acute fracture or dislocation is noted. Underlying bony thorax is within normal limits with the exception of a calcified granuloma in the right upper lobe. IMPRESSION: Degenerative change with findings suggestive of chronic rotator cuff injury. Electronically Signed   By: Inez Catalina M.D.   On: 10/13/2020 22:05   ____________________________________________   PROCEDURES  Procedures  ____________________________________________   INITIAL IMPRESSION / ASSESSMENT AND PLAN / ED COURSE  Davidson Jariwala is a 87 y.o. who presents to the emergency department for treatment and evaluation of acute on chronic right shoulder pain.  See HPI for further details.  Patient tells me that the pain has significantly improved but because it was more intense than usual, he thought that he should come to the emergency department for an x-ray.  X-ray does not show any acute findings.  Patient declined additional pain medications and wanted to just continue taking Tylenol.  He was encouraged to follow-up with either primary care or the orthopedic surgeon.  He is to return to the emergency department for symptoms of change or worsen if he is unable to schedule an appointment.   Medications - No data to display  Pertinent labs & imaging results that were available during my care of the patient were reviewed by me and considered in my medical decision making (see chart for details).   _________________________________________   FINAL CLINICAL IMPRESSION(S) / ED DIAGNOSES  Final diagnoses:  Rotator cuff arthropathy, right    ED Discharge Orders     None        If  controlled substance prescribed during this visit, 12 month history viewed on the [place] prior to issuing an initial prescription for Schedule II or III opiod.    Victorino Dike, FNP 10/13/20 [id number]    Lavonia Drafts, MD 10/16/20 [phone]

## 2020-10-13 NOTE — ED Triage Notes (Signed)
EMS brings pt in from home for c/o rt shoulder pain; seen by PCP and had xray that showed "separation of shoulder"

## 2020-10-13 NOTE — ED Triage Notes (Addendum)
Pt states that he was told a little while ago that he had "shoulder separation" on the right side pt states he was told he needed surgery but pt has put this off. Pt states today he reached for something earlier today and had a sudden inc in pain. Pt states at this time pain is almost all better.

## 2020-12-24 ENCOUNTER — Other Ambulatory Visit: Payer: Self-pay | Admitting: Nephrology

## 2020-12-24 DIAGNOSIS — N184 Chronic kidney disease, stage 4 (severe): Secondary | ICD-10-CM

## 2020-12-24 DIAGNOSIS — R109 Unspecified abdominal pain: Secondary | ICD-10-CM

## 2020-12-24 DIAGNOSIS — R809 Proteinuria, unspecified: Secondary | ICD-10-CM

## 2020-12-28 ENCOUNTER — Emergency Department
Admission: EM | Admit: 2020-12-28 | Discharge: 2020-12-28 | Discharge status: Against Medical Advice | Payer: Medicare Other

## 2020-12-29 ENCOUNTER — Other Ambulatory Visit: Payer: Self-pay

## 2020-12-29 ENCOUNTER — Ambulatory Visit
Admission: RE | Admit: 2020-12-29 | Discharge: 2020-12-29 | Disposition: A | Discharge status: Home / Self Care | Payer: Medicare Other | Source: Ambulatory Visit | Attending: Nephrology | Admitting: Nephrology

## 2020-12-29 DIAGNOSIS — R809 Proteinuria, unspecified: Secondary | ICD-10-CM | POA: Insufficient documentation

## 2020-12-29 DIAGNOSIS — R109 Unspecified abdominal pain: Secondary | ICD-10-CM | POA: Diagnosis not present

## 2020-12-29 DIAGNOSIS — N184 Chronic kidney disease, stage 4 (severe): Secondary | ICD-10-CM | POA: Diagnosis present

## 2020-12-31 ENCOUNTER — Other Ambulatory Visit: Payer: Self-pay

## 2020-12-31 ENCOUNTER — Emergency Department
Admission: EM | Admit: 2020-12-31 | Discharge: 2020-12-31 | Disposition: A | Discharge status: Home / Self Care | Payer: Medicare Other | Attending: Emergency Medicine | Admitting: Emergency Medicine

## 2020-12-31 ENCOUNTER — Ambulatory Visit: Payer: Medicare Other

## 2020-12-31 DIAGNOSIS — I2510 Atherosclerotic heart disease of native coronary artery without angina pectoris: Secondary | ICD-10-CM | POA: Diagnosis not present

## 2020-12-31 DIAGNOSIS — Z96651 Presence of right artificial knee joint: Secondary | ICD-10-CM | POA: Diagnosis not present

## 2020-12-31 DIAGNOSIS — I5043 Acute on chronic combined systolic (congestive) and diastolic (congestive) heart failure: Secondary | ICD-10-CM | POA: Insufficient documentation

## 2020-12-31 DIAGNOSIS — S81812A Laceration without foreign body, left lower leg, initial encounter: Secondary | ICD-10-CM | POA: Insufficient documentation

## 2020-12-31 DIAGNOSIS — Z79899 Other long term (current) drug therapy: Secondary | ICD-10-CM | POA: Diagnosis not present

## 2020-12-31 DIAGNOSIS — E1122 Type 2 diabetes mellitus with diabetic chronic kidney disease: Secondary | ICD-10-CM | POA: Insufficient documentation

## 2020-12-31 DIAGNOSIS — S8992XA Unspecified injury of left lower leg, initial encounter: Secondary | ICD-10-CM | POA: Diagnosis present

## 2020-12-31 DIAGNOSIS — R790 Abnormal level of blood mineral: Secondary | ICD-10-CM | POA: Insufficient documentation

## 2020-12-31 DIAGNOSIS — Z951 Presence of aortocoronary bypass graft: Secondary | ICD-10-CM | POA: Diagnosis not present

## 2020-12-31 DIAGNOSIS — Z7901 Long term (current) use of anticoagulants: Secondary | ICD-10-CM | POA: Diagnosis not present

## 2020-12-31 DIAGNOSIS — X58XXXA Exposure to other specified factors, initial encounter: Secondary | ICD-10-CM | POA: Insufficient documentation

## 2020-12-31 DIAGNOSIS — Z23 Encounter for immunization: Secondary | ICD-10-CM | POA: Insufficient documentation

## 2020-12-31 DIAGNOSIS — N184 Chronic kidney disease, stage 4 (severe): Secondary | ICD-10-CM | POA: Diagnosis not present

## 2020-12-31 DIAGNOSIS — R791 Abnormal coagulation profile: Secondary | ICD-10-CM

## 2020-12-31 DIAGNOSIS — I480 Paroxysmal atrial fibrillation: Secondary | ICD-10-CM | POA: Insufficient documentation

## 2020-12-31 DIAGNOSIS — I130 Hypertensive heart and chronic kidney disease with heart failure and stage 1 through stage 4 chronic kidney disease, or unspecified chronic kidney disease: Secondary | ICD-10-CM | POA: Insufficient documentation

## 2020-12-31 DIAGNOSIS — Z87891 Personal history of nicotine dependence: Secondary | ICD-10-CM | POA: Diagnosis not present

## 2020-12-31 DIAGNOSIS — Z7982 Long term (current) use of aspirin: Secondary | ICD-10-CM | POA: Insufficient documentation

## 2020-12-31 LAB — COMPREHENSIVE METABOLIC PANEL
ALT: 15 U/L (ref 0–44)
AST: 26 U/L (ref 15–41)
Albumin: 3.7 g/dL (ref 3.5–5.0)
Alkaline Phosphatase: 64 U/L (ref 38–126)
Anion gap: 11 (ref 5–15)
BUN: 44 mg/dL — ABNORMAL HIGH (ref 8–23)
CO2: 25 mmol/L (ref 22–32)
Calcium: 9 mg/dL (ref 8.9–10.3)
Chloride: 104 mmol/L (ref 98–111)
Creatinine, Ser: 2.33 mg/dL — ABNORMAL HIGH (ref 0.61–1.24)
GFR, Estimated: 26 mL/min — ABNORMAL LOW (ref >60)
Glucose, Bld: 165 mg/dL — ABNORMAL HIGH (ref 70–99)
Potassium: 4.7 mmol/L (ref 3.5–5.1)
Sodium: 140 mmol/L (ref 135–145)
Total Bilirubin: 0.8 mg/dL (ref 0.3–1.2)
Total Protein: 6.4 g/dL — ABNORMAL LOW (ref 6.5–8.1)

## 2020-12-31 LAB — CBC WITH DIFFERENTIAL/PLATELET
Abs Immature Granulocytes: 0.06 10*3/uL (ref 0.00–0.07)
Basophils Absolute: 0 10*3/uL (ref 0.0–0.1)
Basophils Relative: 0 %
Eosinophils Absolute: 0 10*3/uL (ref 0.0–0.5)
Eosinophils Relative: 0 %
HCT: 32.3 % — ABNORMAL LOW (ref 39.0–52.0)
Hemoglobin: 10.6 g/dL — ABNORMAL LOW (ref 13.0–17.0)
Immature Granulocytes: 1 %
Lymphocytes Relative: 7 %
Lymphs Abs: 0.6 10*3/uL — ABNORMAL LOW (ref 0.7–4.0)
MCH: 32.5 pg (ref 26.0–34.0)
MCHC: 32.8 g/dL (ref 30.0–36.0)
MCV: 99.1 fL (ref 80.0–100.0)
Monocytes Absolute: 0.3 10*3/uL (ref 0.1–1.0)
Monocytes Relative: 4 %
Neutro Abs: 7.3 10*3/uL (ref 1.7–7.7)
Neutrophils Relative %: 88 %
Platelets: 199 10*3/uL (ref 150–400)
RBC: 3.26 MIL/uL — ABNORMAL LOW (ref 4.22–5.81)
RDW: 15.1 % (ref 11.5–15.5)
WBC: 8.3 10*3/uL (ref 4.0–10.5)
nRBC: 0 % (ref 0.0–0.2)

## 2020-12-31 LAB — PROTIME-INR
INR: 7.3 (ref 0.8–1.2)
Prothrombin Time: 62.1 seconds — ABNORMAL HIGH (ref 11.4–15.2)

## 2020-12-31 LAB — TYPE AND SCREEN
ABO/RH(D): A POS
Antibody Screen: NEGATIVE

## 2020-12-31 MED ORDER — TETANUS-DIPHTH-ACELL PERTUSSIS 5-2.5-18.5 LF-MCG/0.5 IM SUSY
0.5000 mL | PREFILLED_SYRINGE | Freq: Once | INTRAMUSCULAR | Status: AC
  Administered 2020-12-31: 0.5 mL via INTRAMUSCULAR
  Filled 2020-12-31: qty 0.5

## 2020-12-31 MED ORDER — CEPHALEXIN 500 MG PO CAPS: 500.0000 mg | ORAL_CAPSULE | Freq: Two times a day (BID) | ORAL | 0 refills | Status: AC

## 2020-12-31 MED ORDER — PHYTONADIONE 5 MG PO TABS
5.0000 mg | ORAL_TABLET | Freq: Once | ORAL | Status: AC
Start: 2020-12-31 — End: 2020-12-31
  Administered 2020-12-31: 5 mg via ORAL
  Filled 2020-12-31 (×2): qty 1

## 2020-12-31 NOTE — Discharge Instructions (Addendum)
Follow-up with wound care for this wound given it was difficult to approximate it.  Start some antibiotics tomorrow to help prevent infection.  Hold your Eliquis tomorrow as your INR levels come down and you can restart Eliquis on 11/5. DO NOT TAKE WARFARIN/COUMADIN again!  Return to the ER for recurrent severe bleeding, or any other concerns

## 2020-12-31 NOTE — ED Provider Notes (Addendum)
[facility] Emergency Department Provider Note  ____________________________________________   Event Date/Time   First MD Initiated Contact with Patient 12/31/20 1726     (approximate)  I have reviewed the triage vital signs and the nursing notes.   HISTORY  Chief Complaint Abnormal Labs    HPI Carlos [name] is a 87 y.o. male who is status post CABG, CKD, diabetes, paroxysmal A. fib who is on warfarin.  Patient reports having labs taken yesterday with INR of 10. Today is 7.3. Pt reports taking eliquis recently. No chest pain, sob, blood in stools.  Also reports wound on the left lower extremity after trying to put vacuum away and it hit his leg.  Patient's been ambulatory since then.  Denies any significant pain just a little bit where the superficial laceration is.  He states that he is had a wound there for years.  He has a slight discoloration that he states is constant for him since he injured it a while ago.  Does report a history of cellulitis in that area as well.  States that it has not healed for over 7 years and will intermittently split open if he hits it on something.  Patient denies any black tarry stool.  The pain is mild, constant, right along the laceration but still able to ambulate   On review of records from cardiology on 10/25 it was noted that patient's INR level has been hard to control therefore they discontinued warfarin due to concern for high bleeding risk and started him on Eliquis 2.5 twice daily given he is over 90 and his creatinine is above 1.5           Past Medical History:  Diagnosis Date   Arthritis    Atrial fibrillation (HCC)    CHF (congestive heart failure) ([place])    Chronic kidney disease    kidney stones   Coronary artery disease    Diabetes mellitus without complication ([place])    Diverticulosis    GERD (gastroesophageal reflux disease)    Gout    Hyperlipidemia    Hypertension    Myocardial infarction ([place])  2001   Peripheral vascular disease ([place])    Pneumothorax, left 1950    Patient Active Problem List   Diagnosis Date Noted   Anemia of chronic disease 09/10/2019   Edema of both lower legs due to peripheral venous insufficiency 09/10/2019   Edema of both lower extremities due to peripheral venous insufficiency 09/10/2019   Acute on chronic combined systolic and diastolic CHF (congestive heart failure) ([place]) 05/23/2019   Acute pain of left shoulder 09/08/2015   CKD (chronic kidney disease), stage IV ([place]) 12/24/2014   Bradycardia 12/02/2014   TI (tricuspid incompetence) 10/21/2014   DD (diverticular disease) 07/30/2014   Essential (primary) hypertension 07/30/2014   Tuberculosis 07/30/2014   Hyperlipidemia 84/69/6295   Chronic systolic CHF (congestive heart failure), NYHA class 2 ([place]) 02/12/2014   AF (paroxysmal atrial fibrillation) ([place]) 02/12/2014   Paroxysmal A-fib ([place]) 02/12/2014   Accumulation of fluid in tissues 07/24/2013   Acid reflux 07/24/2013   Gout 07/24/2013   Diabetes mellitus, type 2 ([place]) 07/24/2013   Type 2 diabetes mellitus with renal manifestations, controlled ([place]) 07/24/2013   Peripheral blood vessel disorder ([place]) 07/19/2013   PVD (peripheral vascular disease) ([place]) 07/19/2013   Combined fat and carbohydrate induced hyperlipemia 05/21/2013   Encounter for therapeutic drug monitoring 12/18/2012   Chronic kidney disease (CKD), stage IV (severe) ([place]) 12/18/2012   Arteriosclerosis  of coronary artery 12/18/2012   Decreased potassium in the blood 12/18/2012   H/O coronary artery bypass surgery 12/18/2012   H/O total knee replacement 12/18/2012   Encounter for therapeutic drug level monitoring 12/18/2012    Past Surgical History:  Procedure Laterality Date   CARDIAC CATHETERIZATION     CARDIAC SURGERY     4 bypass   CHOLECYSTECTOMY     CORONARY ARTERY BYPASS GRAFT     CYSTOSCOPY W/ RETROGRADES N/A 12/03/2014   Procedure: CYSTOSCOPY WITH ATTEMPT FOR  RETROGRADE PYELOGRAM;  Surgeon: [name], MD;  Location: [facility] ORS;  Service: Urology;  Laterality: N/A;   CYSTOSCOPY WITH LITHOLAPAXY N/A 12/03/2014   Procedure: CYSTOSCOPY WITH LITHOLAPAXY WITH HOLMIUM LASER ;  Surgeon: [name], MD;  Location: [facility] ORS;  Service: Urology;  Laterality: N/A;   FEMUR SURGERY     GALLBLADDER SURGERY     HERNIA REPAIR     JOINT REPLACEMENT Right    Total Knee Replacement   LUNG SURGERY Left    REPLACEMENT TOTAL KNEE      Prior to Admission medications   Medication Sig Start Date End Date Taking? Authorizing Provider  acetaminophen (TYLENOL) 325 MG tablet Take 2 tablets (650 mg total) by mouth every 6 (six) hours as needed for headache. 01/08/20 01/07/21  Duffy Bruce, MD  allopurinol (ZYLOPRIM) 300 MG tablet Take 150 mg by mouth daily.  05/09/14   [provider]  aspirin EC 81 MG tablet Take 81 mg by mouth at bedtime.     [provider]  ferrous sulfate 325 (65 FE) MG tablet Take 1 tablet (325 mg total) by mouth daily. 09/12/19 11/29/19  Sharen Hones, MD  finasteride (PROSCAR) 5 MG tablet Take 1 tablet (5 mg total) by mouth daily. 08/13/18   Billey Co, MD  loratadine (CLARITIN) 10 MG tablet Take 10 mg by mouth daily as needed. 07/16/19   [provider]  losartan (COZAAR) 25 MG tablet Take 25 mg by mouth daily. 05/10/19   [provider]  metoprolol succinate (TOPROL-XL) 25 MG 24 hr tablet Take 25 mg by mouth daily. 05/10/19   [provider]  Multiple Vitamin (MULTI-VITAMINS) TABS Take 1 tablet by mouth daily.     [provider]  Omega-3 Fatty Acids (FISH OIL) 1000 MG CAPS Take 1 capsule by mouth daily.    [provider]  simvastatin (ZOCOR) 40 MG tablet Take 40 mg by mouth daily at 6 PM.  07/07/14   [provider]  tamsulosin (FLOMAX) 0.4 MG CAPS capsule Take 1 capsule (0.4 mg total) by mouth daily. 08/13/18   Billey Co, MD  torsemide (DEMADEX) 20 MG tablet  Take 2 tablets (40 mg total) by mouth daily. 05/28/19 11/29/19  Sidney Ace, MD  traMADol (ULTRAM) 50 MG tablet Take 1 tablet (50 mg total) by mouth every 6 (six) hours as needed. Prn pain 07/28/20   Rodriguez-[place], Sunday Spillers, PA-C  colchicine 0.6 MG tablet Take 0.6 mg by mouth as needed.   08/28/18  [provider]    Allergies Patient has no active allergies.  Family History  Problem Relation Age of Onset   Lung cancer Mother    Tuberculosis Brother     Social History Social History   Tobacco Use   Smoking status: Former    Types: Pipe    Quit date: 07/30/2006    Years since quitting: 14.4   Smokeless tobacco: Never  Vaping Use   Vaping  Use: Never used  Substance Use Topics   Alcohol use: Yes    Alcohol/week: 14.0 standard drinks    Types: 14 Glasses of wine per week    Comment: one glass of wine daily   Drug use: No      Review of Systems Constitutional: No fever/chills, abnormal labs  Eyes: No visual changes. ENT: No sore throat. Cardiovascular: Denies chest pain. Respiratory: Denies shortness of breath. Gastrointestinal: No abdominal pain.  No nausea, no vomiting.  No diarrhea.  No constipation. Genitourinary: Negative for dysuria. Musculoskeletal: Negative for back pain. Wound on leg  Skin: Negative for rash. Neurological: Negative for headaches, focal weakness or numbness. All other ROS negative ____________________________________________   PHYSICAL EXAM:  VITAL SIGNS: ED Triage Vitals [12/31/20 1456]  Enc Vitals Group     BP (!) 134/55     Pulse Rate 69     Resp 18     Temp 98.1 F (36.7 C)     Temp Source Oral     SpO2 94 %     Weight 207 lb 0.2 oz (93.9 kg)     Height 5\' 7"  (1.702 m)     Head Circumference      Peak Flow      Pain Score 5     Pain Loc      Pain Edu?      Excl. in [place]?     Constitutional: Alert and oriented. Well appearing and in no acute distress. Eyes: Conjunctivae are normal. EOMI. Head:  Atraumatic. Nose: No congestion/rhinnorhea. Mouth/Throat: Mucous membranes are moist.   Neck: No stridor. Trachea Midline. FROM Cardiovascular: Normal rate, regular rhythm. Grossly normal heart sounds.  Good peripheral circulation. Respiratory: Normal respiratory effort.  No retractions. Lungs CTAB. Gastrointestinal: Soft and nontender. No distention. No abdominal bruits.  Musculoskeletal: No lower extremity tenderness nor edema.  No joint effusions.  Superficial 2 inch skin tear noted to the left lower leg.  Difficult to approximate due to thin skin.  There is a slight discoloration noted which patient states has been there for years. Neurologic:  Normal speech and language. No gross focal neurologic deficits are appreciated.  Skin:  Skin is warm, dry and intact. No rash noted. Psychiatric: Mood and affect are normal. Speech and behavior are normal. GU: Deferred   ____________________________________________   LABS (all labs ordered are listed, but only abnormal results are displayed)  Labs Reviewed  PROTIME-INR - Abnormal; Notable for the following components:      Result Value   Prothrombin Time 62.1 (*)    INR 7.3 (*)    All other components within normal limits  CBC WITH DIFFERENTIAL/PLATELET - Abnormal; Notable for the following components:   RBC 3.26 (*)    Hemoglobin 10.6 (*)    HCT 32.3 (*)    Lymphs Abs 0.6 (*)    All other components within normal limits  COMPREHENSIVE METABOLIC PANEL - Abnormal; Notable for the following components:   Glucose, Bld 165 (*)    BUN 44 (*)    Creatinine, Ser 2.33 (*)    Total Protein 6.4 (*)    GFR, Estimated 26 (*)    All other components within normal limits  TYPE AND SCREEN   ____________________________________________    PROCEDURES  Procedure(s) performed (including Critical Care):  Procedures   ____________________________________________   INITIAL IMPRESSION / ASSESSMENT AND PLAN / ED COURSE  Nikoloz Sison was  evaluated in Emergency Department on 12/31/2020 for the symptoms described in the history  of present illness. He was evaluated in the context of the global COVID-19 pandemic, which necessitated consideration that the patient might be at risk for infection with the SARS-CoV-2 virus that causes COVID-19. Institutional protocols and algorithms that pertain to the evaluation of patients at risk for COVID-19 are in a state of rapid change based on information released by regulatory bodies including the CDC and federal and state organizations. These policies and algorithms were followed during the patient's care in the ED.    Patient comes in with elevated INR.  Patient is off warfarin and is downtrending on its own from 10-7.  However given the new injury with some superficial bleeding we will discussed with pharmacy.  Patient denies any other concerns for bleeding.  He denies any black tarry stool and he declined rectal exam today.    Discussed with pharmacist Cristie Hem and will do 5 mg of oral vitamin K given patient was going to be off the warfarin anyways.  We will also hold his Eliquis until 11/5 given his INR is therapeutic.  Patient looks like he is only on it for A. fib so should be okay to hold it until then to help with this wound healing.  There is no active bleeding from the wound at this time.  Patient's hemoglobin was stable from previous.  He denies hitting his head or any other injuries.  He has been ambulatory since the incident and states that it was superficial in nature and does not feel there is a fracture.  Given he has been ambulatory I have low suspicion for fracture we will hold off on x-ray.  Patient's tetanus is not updated so we will update this.  Attempted to approximate the laceration but I am unable to.  There is about a half a centimeter exposed area/we will place a dressing on it and have patient follow-up with wound care.  We will also give a course of Keflex to help prevent any infection.   Patient feels comfortable with this plan and will discharge him home        ____________________________________________   FINAL CLINICAL IMPRESSION(S) / ED DIAGNOSES   Final diagnoses:  Leg laceration, left, initial encounter  Elevated INR      MEDICATIONS GIVEN DURING THIS VISIT:  Medications  phytonadione (VITAMIN K) tablet 5 mg (has no administration in time range)  Tdap (BOOSTRIX) injection 0.5 mL (has no administration in time range)     ED Discharge Orders          Ordered    cephALEXin (KEFLEX) 500 MG capsule  2 times daily        12/31/20 1844    AMB referral to wound care center        12/31/20 1844             Note:  This document was prepared using Dragon voice recognition software and may include unintentional dictation errors.    Vanessa Burney, MD 12/31/20 1849    Vanessa [place], MD 12/31/20 [phone]

## 2020-12-31 NOTE — ED Notes (Signed)
MD Archie Balboa informed of pt's critical INR result of 7.2

## 2020-12-31 NOTE — ED Notes (Signed)
Patient's L lower leg wound cleaned with NS.  Bandages applied.  Teaching provided to visitor and patient

## 2020-12-31 NOTE — ED Triage Notes (Signed)
Pt to ER via POV after being advised to come to the ER by his PCP due to an abnormally high INR. Reports having labs drawn yesterday.   Pt also here with wound present to left shin with dressing in place. Pt reports vaccuming the floor and then hitting his leg on the vacuum cleaner.

## 2021-01-15 ENCOUNTER — Ambulatory Visit: Payer: Medicare Other | Admitting: Physician Assistant

## 2021-02-12 ENCOUNTER — Other Ambulatory Visit: Payer: Self-pay | Admitting: Physician Assistant

## 2021-02-12 ENCOUNTER — Ambulatory Visit: Payer: Medicare Other | Admitting: Physician Assistant

## 2021-02-12 DIAGNOSIS — M545 Low back pain, unspecified: Secondary | ICD-10-CM

## 2021-03-08 ENCOUNTER — Ambulatory Visit: Payer: Medicare Other

## 2021-03-17 ENCOUNTER — Other Ambulatory Visit: Payer: Self-pay

## 2021-03-17 ENCOUNTER — Ambulatory Visit
Admission: RE | Admit: 2021-03-17 | Discharge: 2021-03-17 | Disposition: A | Discharge status: Home / Self Care | Payer: Medicare Other | Source: Ambulatory Visit | Attending: Student in an Organized Health Care Education/Training Program | Admitting: Student in an Organized Health Care Education/Training Program

## 2021-03-17 ENCOUNTER — Ambulatory Visit (HOSPITAL_BASED_OUTPATIENT_CLINIC_OR_DEPARTMENT_OTHER): Payer: Medicare Other | Admitting: Student in an Organized Health Care Education/Training Program

## 2021-03-17 ENCOUNTER — Ambulatory Visit
Admission: RE | Admit: 2021-03-17 | Discharge: 2021-03-17 | Disposition: A | Discharge status: Home / Self Care | Payer: Medicare Other | Attending: Student in an Organized Health Care Education/Training Program | Admitting: Student in an Organized Health Care Education/Training Program

## 2021-03-17 ENCOUNTER — Encounter: Payer: Self-pay | Admitting: Student in an Organized Health Care Education/Training Program

## 2021-03-17 VITALS — BP 104/57 | HR 78 | Temp 97.1°F | Ht 67.0 in | Wt 207.0 lb

## 2021-03-17 DIAGNOSIS — M542 Cervicalgia: Secondary | ICD-10-CM

## 2021-03-17 DIAGNOSIS — Z9889 Other specified postprocedural states: Secondary | ICD-10-CM

## 2021-03-17 DIAGNOSIS — M47816 Spondylosis without myelopathy or radiculopathy, lumbar region: Secondary | ICD-10-CM | POA: Insufficient documentation

## 2021-03-17 DIAGNOSIS — M25512 Pain in left shoulder: Secondary | ICD-10-CM | POA: Insufficient documentation

## 2021-03-17 DIAGNOSIS — M5136 Other intervertebral disc degeneration, lumbar region: Secondary | ICD-10-CM

## 2021-03-17 DIAGNOSIS — M67911 Unspecified disorder of synovium and tendon, right shoulder: Secondary | ICD-10-CM

## 2021-03-17 DIAGNOSIS — M67912 Unspecified disorder of synovium and tendon, left shoulder: Secondary | ICD-10-CM | POA: Insufficient documentation

## 2021-03-17 DIAGNOSIS — M25511 Pain in right shoulder: Secondary | ICD-10-CM | POA: Insufficient documentation

## 2021-03-17 DIAGNOSIS — G894 Chronic pain syndrome: Secondary | ICD-10-CM

## 2021-03-17 DIAGNOSIS — M51369 Other intervertebral disc degeneration, lumbar region without mention of lumbar back pain or lower extremity pain: Secondary | ICD-10-CM

## 2021-03-17 DIAGNOSIS — G8929 Other chronic pain: Secondary | ICD-10-CM

## 2021-03-17 NOTE — Progress Notes (Signed)
Patient: Carlos [name]  Service Category: E/M  Provider: Gillis Santa, MD  DOB: 12-Apr-1926  DOS: 03/17/2021  Referring Provider: Anthonette Legato, MD  MRN: [id number]  Setting: Ambulatory outpatient  PCP: [name], MD  Type: New Patient  Specialty: Interventional Pain Management    Location: Office  Delivery: Face-to-face     Primary Reason(s) for Visit: Encounter for initial evaluation of one or more chronic problems (new to examiner) potentially causing chronic pain, and posing a threat to normal musculoskeletal function. (Level of risk: High) CC: Shoulder Pain (both)  HPI  Carlos [name] is a 88 y.o. year old, male patient, who comes for the first time to our practice referred by Anthonette Legato, MD for our initial evaluation of his chronic pain. He has Encounter for therapeutic drug monitoring; Chronic systolic CHF (congestive heart failure), NYHA class 2 ([place]); Chronic kidney disease (CKD), stage IV (severe) ([place]); DD (diverticular disease); Accumulation of fluid in tissues; Essential (primary) hypertension; Acid reflux; Gout; Arteriosclerosis of coronary artery; Decreased potassium in the blood; Combined fat and carbohydrate induced hyperlipemia; AF (paroxysmal atrial fibrillation) ([place]); Peripheral blood vessel disorder ([place]); H/O coronary artery bypass surgery; H/O total knee replacement; Diabetes mellitus, type 2 ([place]); Tuberculosis; Hyperlipidemia; Encounter for therapeutic drug level monitoring; TI (tricuspid incompetence); Bradycardia; CKD (chronic kidney disease), stage IV ([place]); Paroxysmal A-fib ([place]); PVD (peripheral vascular disease) ([place]); Type 2 diabetes mellitus with renal manifestations, controlled ([place]); Acute pain of left shoulder; Acute on chronic combined systolic and diastolic CHF (congestive heart failure) ([place]); Anemia of chronic disease; Edema of both lower legs due to peripheral venous insufficiency; Edema of both lower extremities due to peripheral venous insufficiency;  Bilateral rotator cuff dysfunction; History of rotator cuff surgery; Lumbar facet arthropathy; Lumbar degenerative disc disease; and Chronic pain syndrome on their problem list. Today he comes in for evaluation of his Shoulder Pain (both)  Pain Assessment: Location: Left, Right Shoulder Radiating: pain radiaties down to his upper arm Onset: More than a month ago Duration: Chronic pain Quality: Aching, Burning, Constant, Tingling, Shooting, Sharp, Stabbing, Throbbing, Discomfort Severity: 10-Worst pain ever/10 (subjective, self-reported pain score)  Effect on ADL: limits my daily activities Timing: Constant Modifying factors: nothing BP: (!) 104/57   HR: 78  Onset and Duration: Sudden and Present longer than 3 months Cause of pain: Unknown Severity: No change since onset, NAS-11 at its worse: 10/10, NAS-11 at its best: 7/10, NAS-11 now: 8/10, and NAS-11 on the average: 9/10 Timing: Not influenced by the time of the day Aggravating Factors: Bending, Lifiting, and anything that requires shoulder abduction and forward elevation Alleviating Factors: Sleeping Associated Problems: Day-time cramps, Dizziness, Fatigue, Nausea, Sadness, Spasms, Swelling, Tingling, Weakness, Pain that wakes patient up, and Pain that does not allow patient to sleep Quality of Pain: Aching, Agonizing, Annoying, Cramping, Cruel, Deep, Disabling, Distressing, Dreadful, Exhausting, Fearful, Sharp, Shooting, and Sickening Previous Examinations or Tests: CT scan and X-rays Previous Treatments: Narcotic medications, Physical Therapy, Relaxation therapy, Steroid treatments by mouth, Strengthening exercises, Stretching exercises, TENS, and Trigger point injections  Meds   Current Outpatient Medications:    allopurinol (ZYLOPRIM) 300 MG tablet, Take 150 mg by mouth daily. , Disp: , Rfl:    apixaban (ELIQUIS) 2.5 MG TABS tablet, Take by mouth 2 (two) times daily., Disp: , Rfl:    finasteride (PROSCAR) 5 MG tablet, Take 1  tablet (5 mg total) by mouth daily., Disp: 90 tablet, Rfl: 3   gabapentin (NEURONTIN) 100 MG capsule, Take 100 mg by mouth 3 (  three) times daily., Disp: , Rfl:    loratadine (CLARITIN) 10 MG tablet, Take 10 mg by mouth daily as needed., Disp: , Rfl:    losartan (COZAAR) 25 MG tablet, Take 25 mg by mouth daily., Disp: , Rfl:    metoprolol succinate (TOPROL-XL) 25 MG 24 hr tablet, Take 25 mg by mouth daily., Disp: , Rfl:    Multiple Vitamin (MULTI-VITAMINS) TABS, Take 1 tablet by mouth daily. , Disp: , Rfl:    pantoprazole (PROTONIX) 40 MG tablet, Take 40 mg by mouth daily., Disp: , Rfl:    simvastatin (ZOCOR) 40 MG tablet, Take 40 mg by mouth daily at 6 PM. , Disp: , Rfl:    tamsulosin (FLOMAX) 0.4 MG CAPS capsule, Take 1 capsule (0.4 mg total) by mouth daily., Disp: 90 capsule, Rfl: 3   aspirin EC 81 MG tablet, Take 81 mg by mouth at bedtime.  (Patient not taking: Reported on 03/17/2021), Disp: , Rfl:    ferrous sulfate 325 (65 FE) MG tablet, Take 1 tablet (325 mg total) by mouth daily., Disp: 30 tablet, Rfl: 0   Omega-3 Fatty Acids (FISH OIL) 1000 MG CAPS, Take 1 capsule by mouth daily. (Patient not taking: Reported on 03/17/2021), Disp: , Rfl:    torsemide (DEMADEX) 20 MG tablet, Take 2 tablets (40 mg total) by mouth daily., Disp: 60 tablet, Rfl: 0   traMADol (ULTRAM) 50 MG tablet, Take 1 tablet (50 mg total) by mouth every 6 (six) hours as needed. Prn pain (Patient not taking: Reported on 03/17/2021), Disp: 15 tablet, Rfl: 0  Imaging Review  DG Shoulder Right  Narrative CLINICAL DATA:  History of shoulder pain and degenerative change, initial encounter  EXAM: RIGHT SHOULDER - 2+ VIEW  COMPARISON:  07/31/2020 by report only  FINDINGS: Degenerative changes of the acromioclavicular and glenohumeral joints are seen. The humeral head is mildly high-riding which may represent chronic rotator cuff injury. No acute fracture or dislocation is noted. Underlying bony thorax is within normal  limits with the exception of a calcified granuloma in the right upper lobe.  IMPRESSION: Degenerative change with findings suggestive of chronic rotator cuff injury.   Electronically Signed By: Inez Catalina M.D. On: 10/13/2020 22:05   Complexity Note: Imaging results reviewed. Results shared with Mr. Portlock, using Layman's terms.                         ROS  Cardiovascular: Heart trouble, Heart attack ( Date:  ), Heart surgery, Weak heart (CHF), and Blood thinners:  Antiplatelet Pulmonary or Respiratory: Lung problems and Exposure to tuberculosis Neurological: No reported neurological signs or symptoms such as seizures, abnormal skin sensations, urinary and/or fecal incontinence, being born with an abnormal open spine and/or a tethered spinal cord Psychological-Psychiatric: No reported psychological or psychiatric signs or symptoms such as difficulty sleeping, anxiety, depression, delusions or hallucinations (schizophrenial), mood swings (bipolar disorders) or suicidal ideations or attempts Gastrointestinal: No reported gastrointestinal signs or symptoms such as vomiting or evacuating blood, reflux, heartburn, alternating episodes of diarrhea and constipation, inflamed or scarred liver, or pancreas or irrregular and/or infrequent bowel movements Genitourinary: Kidney disease and Passing kidney stones Hematological: Weakness due to low blood hemoglobin or red blood cell count (Anemia), Brusing easily, and Bleeding easily Endocrine: High blood sugar requiring insulin (IDDM) and High blood sugar controlled without the use of insulin (NIDDM) Rheumatologic: No reported rheumatological signs and symptoms such as fatigue, joint pain, tenderness, swelling, redness, heat, stiffness, decreased  range of motion, with or without associated rash Musculoskeletal: Negative for myasthenia gravis, muscular dystrophy, multiple sclerosis or malignant hyperthermia Work History: Retired  Allergies  Mr. Olund  has no active allergies.  Laboratory Chemistry Profile   Renal Lab Results  Component Value Date   BUN 44 (H) 12/31/2020   CREATININE 2.33 (H) 12/31/2020   GFRAA 33 (L) 11/29/2019   GFRNONAA 26 (L) 12/31/2020   SPECGRAV 1.015 03/26/2015   PHUR 5.5 03/26/2015   PROTEINUR Negative 03/26/2015     Electrolytes Lab Results  Component Value Date   NA 140 12/31/2020   K 4.7 12/31/2020   CL 104 12/31/2020   CALCIUM 9.0 12/31/2020   MG 2.2 09/12/2019   PHOS 3.3 05/15/2020     Hepatic Lab Results  Component Value Date   AST 26 12/31/2020   ALT 15 12/31/2020   ALBUMIN 3.7 12/31/2020   ALKPHOS 64 12/31/2020     ID Lab Results  Component Value Date   SARSCOV2NAA NEGATIVE 01/08/2020     Bone No results found for: VD25OH, [id number], EY8144YJ8, HU3149FW2, 25OHVITD1, 25OHVITD2, 25OHVITD3, TESTOFREE, TESTOSTERONE   Endocrine Lab Results  Component Value Date   GLUCOSE 165 (H) 12/31/2020   GLUCOSEU Negative 03/26/2015   HGBA1C 6.0 (H) 05/23/2019   TSH 2.999 05/23/2019     Neuropathy Lab Results  Component Value Date   VITAMINB12 555 09/11/2019   HGBA1C 6.0 (H) 05/23/2019     CNS No results found for: COLORCSF, APPEARCSF, RBCCOUNTCSF, WBCCSF, POLYSCSF, LYMPHSCSF, EOSCSF, PROTEINCSF, GLUCCSF, JCVIRUS, CSFOLI, IGGCSF, LABACHR, ACETBL, LABACHR, ACETBL   Inflammation (CRP: Acute   ESR: Chronic) No results found for: CRP, ESRSEDRATE, LATICACIDVEN   Rheumatology Lab Results  Component Value Date   LABURIC 80 12/03/2014     Coagulation Lab Results  Component Value Date   INR 7.3 (HH) 12/31/2020   LABPROT 62.1 (H) 12/31/2020   APTT 28.7 12/12/2013   PLT 199 12/31/2020     Cardiovascular Lab Results  Component Value Date   BNP 189.0 (H) 01/08/2020   CKTOTAL 161 12/12/2013   CKMB 1.7 12/12/2013   TROPONINI < 0.02 12/12/2013   HGB 10.6 (L) 12/31/2020   HCT 32.3 (L) 12/31/2020     Screening Lab Results  Component Value Date   SARSCOV2NAA NEGATIVE 01/08/2020      Cancer No results found for: CEA, CA125, LABCA2   Allergens No results found for: ALMOND, APPLE, ASPARAGUS, AVOCADO, BANANA, BARLEY, BASIL, BAYLEAF, GREENBEAN, LIMABEAN, WHITEBEAN, BEEFIGE, REDBEET, BLUEBERRY, BROCCOLI, CABBAGE, MELON, CARROT, CASEIN, CASHEWNUT, CAULIFLOWER, CELERY     Note: Lab results reviewed.  [place]  Drug: Mr. Kopke  reports no history of drug use. Alcohol:  reports current alcohol use of about 14.0 standard drinks per week. Tobacco:  reports that he quit smoking about 14 years ago. His smoking use included pipe. He has never used smokeless tobacco. Medical:  has a past medical history of Arthritis, Atrial fibrillation ([place]), CHF (congestive heart failure) ([place]), Chronic kidney disease, Coronary artery disease, Diabetes mellitus without complication ([place]), Diverticulosis, GERD (gastroesophageal reflux disease), Gout, Hyperlipidemia, Hypertension, Myocardial infarction ([place]) (2001), Peripheral vascular disease ([place]), and Pneumothorax, left (1950). Family: family history includes Lung cancer in his mother; Tuberculosis in his brother.  Past Surgical History:  Procedure Laterality Date   CARDIAC CATHETERIZATION     CARDIAC SURGERY     4 bypass   CHOLECYSTECTOMY     CORONARY ARTERY BYPASS GRAFT     CYSTOSCOPY W/ RETROGRADES N/A 12/03/2014   Procedure: CYSTOSCOPY  WITH ATTEMPT FOR RETROGRADE PYELOGRAM;  Surgeon: [name], MD;  Location: [facility] ORS;  Service: Urology;  Laterality: N/A;   CYSTOSCOPY WITH LITHOLAPAXY N/A 12/03/2014   Procedure: CYSTOSCOPY WITH LITHOLAPAXY WITH HOLMIUM LASER ;  Surgeon: [name], MD;  Location: [facility] OR[facility];  Service: Urology;  Laterality: N/A;   FEMUR SURGERY     GALLBLADDER SURGERY     HERNIA REPAIR     JOINT REPLACEMENT Right    Total Knee Replacement   LUNG SURGERY Left    REPLACEMENT TOTAL KNEE     Active Ambulatory Problems    Diagnosis Date Noted   Encounter for therapeutic drug monitoring 12/18/2012   Chronic  systolic CHF (congestive heart failure), NYHA class 2 ([place]) 02/12/2014   Chronic kidney disease (CKD), stage IV (severe) ([place]) 12/18/2012   DD (diverticular disease) 07/30/2014   Accumulation of fluid in tissues 07/24/2013   Essential (primary) hypertension 07/30/2014   Acid reflux 07/24/2013   Gout 07/24/2013   Arteriosclerosis of coronary artery 12/18/2012   Decreased potassium in the blood 12/18/2012   Combined fat and carbohydrate induced hyperlipemia 05/21/2013   AF (paroxysmal atrial fibrillation) ([place]) 02/12/2014   Peripheral blood vessel disorder ([place]) 07/19/2013   H/O coronary artery bypass surgery 12/18/2012   H/O total knee replacement 12/18/2012   Diabetes mellitus, type 2 ([place]) 07/24/2013   Tuberculosis 07/30/2014   Hyperlipidemia 07/30/2014   Encounter for therapeutic drug level monitoring 12/18/2012   TI (tricuspid incompetence) 10/21/2014   Bradycardia 12/02/2014   CKD (chronic kidney disease), stage IV ([place]) 12/24/2014   Paroxysmal A-fib ([place]) 02/12/2014   PVD (peripheral vascular disease) ([place]) 07/19/2013   Type 2 diabetes mellitus with renal manifestations, controlled ([place]) 07/24/2013   Acute pain of left shoulder 09/08/2015   Acute on chronic combined systolic and diastolic CHF (congestive heart failure) ([place]) 05/23/2019   Anemia of chronic disease 09/10/2019   Edema of both lower legs due to peripheral venous insufficiency 09/10/2019   Edema of both lower extremities due to peripheral venous insufficiency 09/10/2019   Bilateral rotator cuff dysfunction 03/17/2021   History of rotator cuff surgery 03/17/2021   Lumbar facet arthropathy 03/17/2021   Lumbar degenerative disc disease 03/17/2021   Chronic pain syndrome 03/17/2021   Resolved Ambulatory Problems    Diagnosis Date Noted   No Resolved Ambulatory Problems   Past Medical History:  Diagnosis Date   Arthritis    Atrial fibrillation (HCC)    CHF (congestive heart failure) ([place])    Chronic kidney  disease    Coronary artery disease    Diabetes mellitus without complication ([place])    Diverticulosis    GERD (gastroesophageal reflux disease)    Hypertension    Myocardial infarction ([place]) 2001   Peripheral vascular disease ([place])    Pneumothorax, left 1950   Constitutional Exam  General appearance: Well nourished, well developed, and well hydrated. In no apparent acute distress Vitals:   03/17/21 1247  BP: (!) 104/57  Pulse: 78  Temp: (!) 97.1 F (36.2 C)  SpO2: 98%  Weight: 207 lb (93.9 kg)  Height: '5\' 7"'  (1.702 m)   BMI Assessment: Estimated body mass index is 32.42 kg/m as calculated from the following:   Height as of this encounter: '5\' 7"'  (1.702 m).   Weight as of this encounter: 207 lb (93.9 kg).  BMI interpretation table: BMI level Category Range association with higher incidence of chronic pain  <18 kg/m2 Underweight   18.5-24.9 kg/m2 Ideal body weight  25-29.9 kg/m2 Overweight Increased incidence by 20%  30-34.9 kg/m2 Obese (Class I) Increased incidence by 68%  35-39.9 kg/m2 Severe obesity (Class II) Increased incidence by 136%  >40 kg/m2 Extreme obesity (Class III) Increased incidence by 254%   Patient's current BMI Ideal Body weight  Body mass index is 32.42 kg/m. Ideal body weight: 66.1 kg (145 lb 11.6 oz) Adjusted ideal body weight: 77.2 kg (170 lb 3.8 oz)   BMI Readings from Last 4 Encounters:  03/17/21 32.42 kg/m  12/31/20 32.42 kg/m  10/13/20 32.42 kg/m  07/28/20 32.73 kg/m   Wt Readings from Last 4 Encounters:  03/17/21 207 lb (93.9 kg)  12/31/20 207 lb 0.2 oz (93.9 kg)  10/13/20 207 lb (93.9 kg)  07/28/20 209 lb (94.8 kg)    Psych/Mental status: Alert, oriented x 3 (person, place, & time)       Eyes: PERLA Respiratory: No evidence of acute respiratory distress  Cervical Spine Area Exam  Skin & Axial Inspection: No masses, redness, edema, swelling, or associated skin lesions Alignment: Symmetrical Functional ROM: Pain restricted ROM,  bilaterally Stability: No instability detected Muscle Tone/Strength: Functionally intact. No obvious neuro-muscular anomalies detected. Sensory (Neurological): Musculoskeletal pain pattern Palpation: No palpable anomalies              Upper Extremity (UE) Exam    Side: Right upper extremity  Side: Left upper extremity  Skin & Extremity Inspection: Evidence of prior arthroplastic surgery  Skin & Extremity Inspection: Evidence of prior arthroplastic surgery  Functional ROM: Pain restricted ROM for shoulder  Functional ROM: Pain restricted ROM for shoulder  Muscle Tone/Strength: Functionally intact. No obvious neuro-muscular anomalies detected.  Muscle Tone/Strength: Functionally intact. No obvious neuro-muscular anomalies detected.  Sensory (Neurological): Arthropathic arthralgia          Sensory (Neurological): Arthropathic arthralgia          Palpation: No palpable anomalies              Palpation: No palpable anomalies              Provocative Test(s):  Phalen's test: deferred Tinel's test: deferred Apley's scratch test (touch opposite shoulder):  Action 1 (Across chest): Decreased ROM Action 2 (Overhead): Decreased ROM Action 3 (LB reach): Decreased ROM   Provocative Test(s):  Phalen's test: deferred Tinel's test: deferred Apley's scratch test (touch opposite shoulder):  Action 1 (Across chest): Decreased ROM Action 2 (Overhead): Decreased ROM Action 3 (LB reach): Decreased ROM    Lumbar Spine Area Exam  Skin & Axial Inspection: No masses, redness, or swelling Alignment: Symmetrical Functional ROM: Pain restricted ROM affecting both sides Stability: No instability detected Muscle Tone/Strength: Functionally intact. No obvious neuro-muscular anomalies detected. Sensory (Neurological): Musculoskeletal pain pattern  Gait & Posture Assessment  Ambulation: Patient came in today in a wheel chair Gait: Limited. Using assistive device to ambulate Posture: Difficulty standing up  straight, due to pain  Lower Extremity Exam    Side: Right lower extremity  Side: Left lower extremity  Stability: No instability observed          Stability: No instability observed          Skin & Extremity Inspection: Skin color, temperature, and hair growth are WNL. No peripheral edema or cyanosis. No masses, redness, swelling, asymmetry, or associated skin lesions. No contractures.  Skin & Extremity Inspection: Skin color, temperature, and hair growth are WNL. No peripheral edema or cyanosis. No masses, redness, swelling, asymmetry, or associated skin lesions. No contractures.  Functional ROM: Decreased ROM for hip joint          Functional ROM: Diminished ROM for hip joint          Muscle Tone/Strength: Functionally intact. No obvious neuro-muscular anomalies detected.  Muscle Tone/Strength: Functionally intact. No obvious neuro-muscular anomalies detected.  Sensory (Neurological): Unimpaired        Sensory (Neurological): Unimpaired        DTR: Patellar: deferred today Achilles: deferred today Plantar: deferred today  DTR: Patellar: deferred today Achilles: deferred today Plantar: deferred today  Palpation: No palpable anomalies  Palpation: No palpable anomalies    Assessment  Primary Diagnosis & Pertinent Problem List: The primary encounter diagnosis was Bilateral rotator cuff dysfunction. Diagnoses of History of shoulder surgery, History of rotator cuff surgery (bilateral), Lumbar facet arthropathy, Lumbar degenerative disc disease, Chronic pain syndrome, Chronic pain of both shoulders, and Cervicalgia were also pertinent to this visit.  Visit Diagnosis (New problems to examiner): 1. Bilateral rotator cuff dysfunction   2. History of shoulder surgery   3. History of rotator cuff surgery (bilateral)   4. Lumbar facet arthropathy   5. Lumbar degenerative disc disease   6. Chronic pain syndrome   7. Chronic pain of both shoulders   8. Cervicalgia    Plan of Care (Initial  workup plan)   1. History of bilateral rotator cuff surgery now having persistent bilateral shoulder pain.  Has done physical therapy, takes Tylenol as well as gabapentin with limited benefit.  Tramadol not effective.  Avoid opioids given fall risk and risk of cognitive dysfunction.  Recommend bilateral suprascapular nerve blocks and if effective, can consider pulsed radiofrequency ablation of the suprascapular nerve.  2.  Cervicalgia: Cervical spine MRI as below  3.  Low back pain related to lumbar DDD, baseline lumbar spine x-ray as below.  Problem-specific plan: No problem-specific Assessment & Plan notes found for this encounter. Lab Orders         Compliance Drug Analysis, Ur     Imaging Orders         DG Cervical Spine Complete         DG Lumbar Spine Complete W/Bend         DG Shoulder Right         DG Shoulder Left      Procedure Orders         SUPRASCAPULAR NERVE BLOCK       Provider-requested follow-up: Return in about 1 week (around 03/24/2021) for B/L suprascapular nerve block , without sedation.  I spent a total of 60 minutes reviewing chart data, face-to-face evaluation with the patient, counseling and coordination of care as detailed above.   No future appointments.  Note by: Gillis Santa, MD Date: 03/17/2021; Time: 2:47 PM

## 2021-03-17 NOTE — Progress Notes (Signed)
Safety precautions to be maintained throughout the outpatient stay will include: orient to surroundings, keep bed in low position, maintain call bell within reach at all times, provide assistance with transfer out of bed and ambulation.  

## 2021-03-18 ENCOUNTER — Telehealth: Payer: Self-pay

## 2021-03-18 NOTE — Telephone Encounter (Signed)
Received call from [facility] radiology with report Of L3 compression fracture with 50% loss of height.  Dr Holley Raring notified.

## 2021-03-23 LAB — COMPLIANCE DRUG ANALYSIS, UR

## 2021-03-29 ENCOUNTER — Other Ambulatory Visit: Payer: Self-pay

## 2021-03-29 ENCOUNTER — Ambulatory Visit (HOSPITAL_BASED_OUTPATIENT_CLINIC_OR_DEPARTMENT_OTHER): Payer: Medicare Other | Admitting: Student in an Organized Health Care Education/Training Program

## 2021-03-29 ENCOUNTER — Ambulatory Visit
Admission: RE | Admit: 2021-03-29 | Discharge: 2021-03-29 | Disposition: A | Discharge status: Home / Self Care | Payer: Medicare Other | Source: Ambulatory Visit | Attending: Student in an Organized Health Care Education/Training Program | Admitting: Student in an Organized Health Care Education/Training Program

## 2021-03-29 ENCOUNTER — Encounter: Payer: Self-pay | Admitting: Student in an Organized Health Care Education/Training Program

## 2021-03-29 DIAGNOSIS — M25511 Pain in right shoulder: Secondary | ICD-10-CM

## 2021-03-29 DIAGNOSIS — G8929 Other chronic pain: Secondary | ICD-10-CM | POA: Diagnosis present

## 2021-03-29 DIAGNOSIS — M25512 Pain in left shoulder: Secondary | ICD-10-CM

## 2021-03-29 DIAGNOSIS — Z9889 Other specified postprocedural states: Secondary | ICD-10-CM | POA: Insufficient documentation

## 2021-03-29 DIAGNOSIS — M67912 Unspecified disorder of synovium and tendon, left shoulder: Secondary | ICD-10-CM | POA: Diagnosis not present

## 2021-03-29 DIAGNOSIS — M67911 Unspecified disorder of synovium and tendon, right shoulder: Secondary | ICD-10-CM | POA: Diagnosis present

## 2021-03-29 DIAGNOSIS — G894 Chronic pain syndrome: Secondary | ICD-10-CM | POA: Diagnosis not present

## 2021-03-29 MED ORDER — DEXAMETHASONE SODIUM PHOSPHATE 10 MG/ML IJ SOLN
10.0000 mg | Freq: Once | INTRAMUSCULAR | Status: DC
  Filled 2021-03-29: qty 1

## 2021-03-29 MED ORDER — IOHEXOL 180 MG/ML  SOLN
10.0000 mL | Freq: Once | INTRAMUSCULAR | Status: DC
  Filled 2021-03-29: qty 10

## 2021-03-29 MED ORDER — LIDOCAINE HCL 2 % IJ SOLN
20.0000 mL | Freq: Once | INTRAMUSCULAR | Status: DC
Start: 2021-03-29 — End: 2021-03-29
  Filled 2021-03-29: qty 20

## 2021-03-29 MED ORDER — ROPIVACAINE HCL 2 MG/ML IJ SOLN
9.0000 mL | Freq: Once | INTRAMUSCULAR | Status: DC
Start: 2021-03-29 — End: 2021-03-29

## 2021-03-29 MED ORDER — ROPIVACAINE HCL 2 MG/ML IJ SOLN
INTRAMUSCULAR | Status: AC
  Filled 2021-03-29: qty 20

## 2021-03-29 NOTE — Patient Instructions (Signed)

## 2021-03-29 NOTE — Progress Notes (Signed)
Safety precautions to be maintained throughout the outpatient stay will include: orient to surroundings, keep bed in low position, maintain call bell within reach at all times, provide assistance with transfer out of bed and ambulation.  

## 2021-03-29 NOTE — Progress Notes (Signed)
PROVIDER NOTE: Interpretation of information contained herein should be left to medically-trained personnel. Specific patient instructions are provided elsewhere under "Patient Instructions" section of medical record. This document was created in part using STT-dictation technology, any transcriptional errors that may result from this process are unintentional.  Patient: Carlos [name] Type: Established DOB: 05-Feb-1927 MRN: [id number] PCP: [name], MD  Service: Procedure DOS: 03/29/2021 Setting: Ambulatory Location: Ambulatory outpatient facility Delivery: Face-to-face Provider: Gillis Santa, MD Specialty: Interventional Pain Management Specialty designation: 09 Location: Outpatient facility Ref. Prov.: Gillis Santa, MD    Primary Reason for Visit: Interventional Pain Management Treatment. CC: Shoulder Pain (bilat)  Procedure:           Type: Suprascapular nerve block #1  Laterality:  Bilateral Level: Superior to scapular spine, lateral to supraspinatus fossa (Suprascapular notch).  Purpose: Diagnostic/Therapeutic Date: 03/29/2021 Performed by: Gillis Santa, MD Anesthesia: Local (1-2% Lidocaine)  Anxiolysis: None  Sedation: None  Guidance: Fluoroscopy          Indications: Shoulder pain, severe enough to impact quality of life and/or function. 1. Bilateral rotator cuff dysfunction   2. History of shoulder surgery   3. History of rotator cuff surgery (bilateral)   4. Chronic pain syndrome   5. Chronic pain of both shoulders    NAS-11 score:   Pre-procedure: 9 /10   Post-procedure: 8  (explained diary to daughter)/10     Target: Suprascapular nerve Location: midway between the medial border of the scapula and the acromion as it runs through the suprascapular notch. Region: Suprascapular, posterior shoulder  Approach: Percutaneous  Neuroanatomy: The suprascapular nerve is the lateral branch of the superior trunk of the brachial plexus. It receives nerve fibers that  originate in the nerve roots C5 and C6 (and sometimes C4). It is a mixed nerve, meaning that it provides both sensory and motor supply for the suprascapular region. Function: The main function of this nerve is to provide motor innervation for two muscles, the supraspinatus and infraspinatus muscles. They are part of the rotator cuff muscles. In addition, the suprascapular nerve provides a sensory supply to the joints of the scapula (glenohumeral and acromioclavicular joints). Rationale (medical necessity): procedure needed and proper for the diagnosis and/or treatment of the patient's medical symptoms and needs.  Position / Prep / Materials:  Position: Prone Materials:  Tray: Block Needle(s):  Type: Spinal  Gauge (G): 22  Length: 3.5 in.  Qty: 1 Prep solution: DuraPrep (Iodine Povacrylex [0.7% available iodine] and Isopropyl Alcohol, 74% w/w) Prep Area: Entire posterior shoulder area. From upper spine to shoulder proper (upper arm), and from lateral neck to lower tip of shoulder blade.   Pre-op H&P Assessment:  Carlos [name] is a 88 y.o. (year old), male patient, seen today for interventional treatment. He  has a past surgical history that includes Lung surgery (Left); Gallbladder surgery; Hernia repair; Replacement total knee; Femur Surgery; Cardiac surgery; Coronary artery bypass graft; Cardiac catheterization; Cholecystectomy; Joint replacement (Right); Cystoscopy with litholapaxy (N/A, 12/03/2014); and Cystoscopy w/ retrogrades (N/A, 12/03/2014). Carlos [name] has a current medication list which includes the following prescription(s): allopurinol, apixaban, finasteride, gabapentin, loratadine, metoprolol succinate, multi-vitamins, pantoprazole, simvastatin, tamsulosin, torsemide, aspirin ec, ferrous sulfate, losartan, fish oil, tramadol, and [DISCONTINUED] colchicine, and the following Facility-Administered Medications: dexamethasone, dexamethasone, iohexol, lidocaine, and ropivacaine (pf) 2 mg/ml (0.2%).  His primarily concern today is the Shoulder Pain (bilat)  Initial Vital Signs:  Pulse/HCG Rate: 65  Temp: (!) 97.5 F (36.4 C) Resp: 16 BP: (!) 119/52 SpO2:  97 %  BMI: Estimated body mass index is 32.42 kg/m as calculated from the following:   Height as of this encounter: 5\' 7"  (1.702 m).   Weight as of this encounter: 207 lb (93.9 kg).  Risk Assessment: Allergies: Reviewed. He has no active allergies.  Allergy Precautions: None required Coagulopathies: Reviewed. None identified.  Blood-thinner therapy: None at this time Active Infection(s): Reviewed. None identified. Carlos [name] is afebrile  Site Confirmation: Carlos [name] was asked to confirm the procedure and laterality before marking the site Procedure checklist: Completed Consent: Before the procedure and under the influence of no sedative(s), amnesic(s), or anxiolytics, the patient was informed of the treatment options, risks and possible complications. To fulfill our ethical and legal obligations, as recommended by the American Medical Association's Code of Ethics, I have informed the patient of my clinical impression; the nature and purpose of the treatment or procedure; the risks, benefits, and possible complications of the intervention; the alternatives, including doing nothing; the risk(s) and benefit(s) of the alternative treatment(s) or procedure(s); and the risk(s) and benefit(s) of doing nothing. The patient was provided information about the general risks and possible complications associated with the procedure. These may include, but are not limited to: failure to achieve desired goals, infection, bleeding, organ or nerve damage, allergic reactions, paralysis, and death. In addition, the patient was informed of those risks and complications associated to the procedure, such as failure to decrease pain; infection; bleeding; organ or nerve damage with subsequent damage to sensory, motor, and/or autonomic systems, resulting in  permanent pain, numbness, and/or weakness of one or several areas of the body; allergic reactions; (i.e.: anaphylactic reaction); and/or death. Furthermore, the patient was informed of those risks and complications associated with the medications. These include, but are not limited to: allergic reactions (i.e.: anaphylactic or anaphylactoid reaction(s)); adrenal axis suppression; blood sugar elevation that in diabetics may result in ketoacidosis or comma; water retention that in patients with history of congestive heart failure may result in shortness of breath, pulmonary edema, and decompensation with resultant heart failure; weight gain; swelling or edema; medication-induced neural toxicity; particulate matter embolism and blood vessel occlusion with resultant organ, and/or nervous system infarction; and/or aseptic necrosis of one or more joints. Finally, the patient was informed that Medicine is not an exact science; therefore, there is also the possibility of unforeseen or unpredictable risks and/or possible complications that may result in a catastrophic outcome. The patient indicated having understood very clearly. We have given the patient no guarantees and we have made no promises. Enough time was given to the patient to ask questions, all of which were answered to the patient's satisfaction. Mr. Fini has indicated that he wanted to continue with the procedure. Attestation: I, the ordering provider, attest that I have discussed with the patient the benefits, risks, side-effects, alternatives, likelihood of achieving goals, and potential problems during recovery for the procedure that I have provided informed consent. Date   Time: 03/29/2021  9:49 AM  Pre-Procedure Preparation:  Monitoring: As per clinic protocol. Respiration, ETCO2, SpO2, BP, heart rate and rhythm monitor placed and checked for adequate function Safety Precautions: Patient was assessed for positional comfort and pressure points before  starting the procedure. Time-out: I initiated and conducted the "Time-out" before starting the procedure, as per protocol. The patient was asked to participate by confirming the accuracy of the "Time Out" information. Verification of the correct person, site, and procedure were performed and confirmed by me, the nursing staff, and the patient. "  Time-out" conducted as per Joint Commission's Universal Protocol (UP.01.01.01). Time: 1050  Description of Procedure:          Procedural Technique Safety Precautions: Aspiration looking for blood return was conducted prior to all injections. At no point did we inject any substances, as a needle was being advanced. No attempts were made at seeking any paresthesias. Safe injection practices and needle disposal techniques used. Medications properly checked for expiration dates. SDV (single dose vial) medications used. Description of the Procedure: Protocol guidelines were followed. The patient was placed in position over the procedure table. The target area was identified and the area prepped in the usual manner. Skin & deeper tissues infiltrated with local anesthetic. Appropriate amount of time allowed to pass for local anesthetics to take effect. The procedure needles were then advanced to the target area. Proper needle placement secured. Negative aspiration confirmed. Solution injected in intermittent fashion, asking for systemic symptoms every 0.5cc of injectate. The needles were then removed and the area cleansed, making sure to leave some of the prepping solution back to take advantage of its long term bactericidal properties.  Vitals:   03/29/21 1012 03/29/21 1044 03/29/21 1054  BP: (!) 119/52 (!) 134/57 (!) 113/57  Pulse: 65 74 69  Resp: 16 18 17   Temp: (!) 97.5 F (36.4 C)    TempSrc: Temporal    SpO2: 97% 96% 96%  Weight: 207 lb (93.9 kg)    Height: 5\' 7"  (1.702 m)      Start Time: 1050 hrs. End Time: 1057 hrs. Materials:  Needle(s) Type: Spinal  Needle Gauge: 25G Length: 3.5-in Medication(s): Please see orders for medications and dosing details. 10 cc solution made of 8 cc of 0.2% ropivacaine, 2 cc of Decadron 10 mg/cc. 5 cc injected at each level bilaterally after contrast confirmation    Imaging Guidance (Non-Spinal):          Type of Imaging Technique: Fluoroscopy Guidance (Non-Spinal) Indication(s): Assistance in needle guidance and placement for procedures requiring needle placement in or near specific anatomical locations not easily accessible without such assistance. Exposure Time: Please see nurses notes. Contrast: Before injecting any contrast, we confirmed that the patient did not have an allergy to iodine, shellfish, or radiological contrast. Once satisfactory needle placement was completed at the desired level, radiological contrast was injected. Contrast injected under live fluoroscopy. No contrast complications. See chart for type and volume of contrast used. Fluoroscopic Guidance: I was personally present during the use of fluoroscopy. "Tunnel Vision Technique" used to obtain the best possible view of the target area. Parallax error corrected before commencing the procedure. "Direction-depth-direction" technique used to introduce the needle under continuous pulsed fluoroscopy. Once target was reached, antero-posterior, oblique, and lateral fluoroscopic projection used confirm needle placement in all planes. Images permanently stored in EMR. Interpretation: I personally interpreted the imaging intraoperatively. Adequate needle placement confirmed in multiple planes. Appropriate spread of contrast into desired area was observed. No evidence of afferent or efferent intravascular uptake. Permanent images saved into the patient's record.  Post-operative Assessment:  Post-procedure Vital Signs:  Pulse/HCG Rate: 69 (SV Rhythm  with PVC's)  Temp:  (!) 97.5 F (36.4 C) Resp: 17 BP:  (!) 113/57 SpO2: 96 %  EBL:  None  Complications: No immediate post-treatment complications observed by team, or reported by patient.  Note: The patient tolerated the entire procedure well. A repeat set of vitals were taken after the procedure and the patient was kept under observation following institutional policy, for this type  of procedure. Post-procedural neurological assessment was performed, showing return to baseline, prior to discharge. The patient was provided with post-procedure discharge instructions, including a section on how to identify potential problems. Should any problems arise concerning this procedure, the patient was given instructions to immediately contact us, at any time, without hesitation. In any case, we plan to contact the patient by telephone for a follow-up status report regarding this interventional procedure.  Comments:  No additional relevant information.  Plan of Care  Orders:  Orders Placed This Encounter  Procedures   DG PAIN CLINIC C-ARM 1-60 MIN NO REPORT    Intraoperative interpretation by procedural physician at [place].    Standing Status:   Standing    Number of Occurrences:   1    Order Specific Question:   Reason for exam:    Answer:   Assistance in needle guidance and placement for procedures requiring needle placement in or near specific anatomical locations not easily accessible without such assistance.    Medications ordered for procedure: Meds ordered this encounter  Medications   iohexol (OMNIPAQUE) 180 MG/ML injection 10 mL    Must be Myelogram-compatible. If not available, you may substitute with a water-soluble, non-ionic, hypoallergenic, myelogram-compatible radiological contrast medium.   lidocaine (XYLOCAINE) 2 % (with pres) injection 400 mg   dexamethasone (DECADRON) injection 10 mg   dexamethasone (DECADRON) injection 10 mg   ropivacaine (PF) 2 mg/mL (0.2%) (NAROPIN) injection 9 mL   Medications administered: Ajamu Castleman had no medications  administered during this visit.  See the medical record for exact dosing, route, and time of administration.  Follow-up plan:   Return in about 3 weeks (around 04/19/2021) for Post Procedure Evaluation, virtual.      Recent Visits Date Type Provider Dept  03/17/21 Office Visit Gillis Santa, MD [facility]  Showing recent visits within past 90 days and meeting all other requirements Today's Visits Date Type Provider Dept  03/29/21 Procedure visit Gillis Santa, MD [facility]  Showing today's visits and meeting all other requirements Future Appointments Date Type Provider Dept  04/19/21 Appointment Gillis Santa, MD [facility]  Showing future appointments within next 90 days and meeting all other requirements  Disposition: Discharge home  Discharge (Date   Time): 03/29/2021; 1104 hrs.   Primary Care Physician: [name], MD Location: [facility] Note by: Gillis Santa, MD Date: 03/29/2021; Time: 11:11 AM  Disclaimer:  Medicine is not an exact science. The only guarantee in medicine is that nothing is guaranteed. It is important to note that the decision to proceed with this intervention was based on the information collected from the patient. The Data and conclusions were drawn from the patient's questionnaire, the interview, and the physical examination. Because the information was provided in large part by the patient, it cannot be guaranteed that it has not been purposely or unconsciously manipulated. Every effort has been made to obtain as much relevant data as possible for this evaluation. It is important to note that the conclusions that lead to this procedure are derived in large part from the available data. Always take into account that the treatment will also be dependent on availability of resources and existing treatment guidelines, considered by other Pain Management Practitioners as being common knowledge and  practice, at the time of the intervention. For Medico-Legal purposes, it is also important to point out that variation in procedural techniques and pharmacological choices are the acceptable norm. The indications,  contraindications, technique, and results of the above procedure should only be interpreted and judged by a Board-Certified Interventional Pain Specialist with extensive familiarity and expertise in the same exact procedure and technique.

## 2021-03-30 ENCOUNTER — Telehealth: Payer: Self-pay | Admitting: *Deleted

## 2021-03-30 NOTE — Telephone Encounter (Signed)
Post procedure call; voicemail left to please call our office if there are any questions or concerns.

## 2021-04-01 ENCOUNTER — Telehealth: Payer: Self-pay | Admitting: Pain Medicine

## 2021-04-01 NOTE — Telephone Encounter (Signed)
Returned patient phone call. Voicemail left.

## 2021-04-01 NOTE — Telephone Encounter (Signed)
Patient says pain is back and would like some advice on what he can do.

## 2021-04-05 ENCOUNTER — Encounter: Payer: Self-pay | Admitting: Student in an Organized Health Care Education/Training Program

## 2021-04-05 ENCOUNTER — Ambulatory Visit
Payer: Medicare Other | Attending: Student in an Organized Health Care Education/Training Program | Admitting: Student in an Organized Health Care Education/Training Program

## 2021-04-05 ENCOUNTER — Telehealth: Payer: Self-pay | Admitting: Student in an Organized Health Care Education/Training Program

## 2021-04-05 DIAGNOSIS — M67911 Unspecified disorder of synovium and tendon, right shoulder: Secondary | ICD-10-CM

## 2021-04-05 DIAGNOSIS — G894 Chronic pain syndrome: Secondary | ICD-10-CM

## 2021-04-05 DIAGNOSIS — Z9889 Other specified postprocedural states: Secondary | ICD-10-CM | POA: Diagnosis not present

## 2021-04-05 DIAGNOSIS — M67912 Unspecified disorder of synovium and tendon, left shoulder: Secondary | ICD-10-CM

## 2021-04-05 DIAGNOSIS — M25511 Pain in right shoulder: Secondary | ICD-10-CM | POA: Diagnosis not present

## 2021-04-05 DIAGNOSIS — G8929 Other chronic pain: Secondary | ICD-10-CM

## 2021-04-05 DIAGNOSIS — M25512 Pain in left shoulder: Secondary | ICD-10-CM

## 2021-04-05 NOTE — Telephone Encounter (Signed)
Patient added on for VV this afternoon.  BL will be speaking with daughter.

## 2021-04-05 NOTE — Progress Notes (Signed)
Patient: Carlos [name]  Service Category: E/M  Provider: Gillis Santa, MD  DOB: 1926/12/11  DOS: 04/05/2021  Location: Office  MRN: [id number]  Setting: Ambulatory outpatient  Referring Provider: [name], MD  Type: Established Patient  Specialty: Interventional Pain Management  PCP: Carlos Hartigan, MD  Location: Remote location  Delivery: TeleHealth     Virtual Encounter - Pain Management PROVIDER NOTE: Information contained herein reflects review and annotations entered in association with encounter. Interpretation of such information and data should be left to medically-trained personnel. Information provided to patient can be located elsewhere in the medical record under "Patient Instructions". Document created using STT-dictation technology, any transcriptional errors that may result from process are unintentional.    Contact & Pharmacy Preferred: [phone] Home: [phone] (home) Mobile: [phone] (mobile) E-mail: [email or web address]  [place], [place] [address] Phone: [phone] Fax: [phone]  [facility] [place] [place] [address] Phone: [phone] Fax: [phone]   Pre-screening  Carlos [name] offered "in-person" vs "virtual" encounter. He indicated preferring virtual for this encounter.   Reason COVID-19*   Social distancing based on CDC and AMA recommendations.   I contacted Carlos [name] on 04/05/2021 via telephone.      I clearly identified myself as Carlos Santa, MD. I verified that I was speaking with the correct person using two identifiers (Name: Carlos [name], and date of birth: 1927/02/09).  Consent I sought verbal advanced consent from Carlos [name] for virtual visit interactions. I informed Carlos [name] of possible security and privacy concerns, risks, and limitations associated with providing "not-in-person" medical  evaluation and management services. I also informed Carlos [name] of the availability of "in-person" appointments. Finally, I informed him that there would be a charge for the virtual visit and that he could be  personally, fully or partially, financially responsible for it. Carlos [name] expressed understanding and agreed to proceed.   Historic Elements   Carlos [name] is a 88 y.o. year old, male patient evaluated today after our last contact on 04/05/2021. Carlos [name]  has a past medical history of Arthritis, Atrial fibrillation ([place]), CHF (congestive heart failure) ([place]), Chronic kidney disease, Coronary artery disease, Diabetes mellitus without complication ([place]), Diverticulosis, GERD (gastroesophageal reflux disease), Gout, Hyperlipidemia, Hypertension, Myocardial infarction ([place]) (2001), Peripheral vascular disease ([place]), and Pneumothorax, left (1950). He also  has a past surgical history that includes Lung surgery (Left); Gallbladder surgery; Hernia repair; Replacement total knee; Femur Surgery; Cardiac surgery; Coronary artery bypass graft; Cardiac catheterization; Cholecystectomy; Joint replacement (Right); Cystoscopy with litholapaxy (N/A, 12/03/2014); and Cystoscopy w/ retrogrades (N/A, 12/03/2014). [name] has a current medication list which includes the following prescription(s): [name], apixaban, aspirin ec, ferrous sulfate, finasteride, gabapentin, loratadine, losartan, metoprolol succinate, multi-vitamins, fish oil, pantoprazole, simvastatin, tamsulosin, torsemide, tramadol, and [DISCONTINUED] colchicine. He  reports that he quit smoking about 14 years ago. His smoking use included pipe. He has never used smokeless tobacco. He reports current alcohol use of about 14.0 standard drinks per week. He reports that he does not use drugs. [name] has no active allergies.   HPI  Today, he is being contacted for a post-procedure assessment.  Patient is status post bilateral diagnostic suprascapular nerve  block performed on 03/29/2021.  He endorses 100% pain relief for approximately 2 days with return of pain thereafter.  His pain is now back to its preinjection level.  He is inquiring about neck steps.  I am reluctant to offer the patient a radiofrequency ablation as a neck step.  Recommend confirmatory diagnostic repeat injection for bilateral suprascapular nerves.  If this is effective then can consider pulsed radiofrequency ablation of the suprascapular nerve.  This was relayed to the patient's daughter.  Laboratory Chemistry Profile   Renal Lab Results  Component Value Date   BUN 44 (H) 12/31/2020   CREATININE 2.33 (H) 12/31/2020   GFRAA 33 (L) 11/29/2019   GFRNONAA 26 (L) 12/31/2020    Hepatic Lab Results  Component Value Date   AST 26 12/31/2020   ALT 15 12/31/2020   ALBUMIN 3.7 12/31/2020   ALKPHOS 64 12/31/2020    Electrolytes Lab Results  Component Value Date   NA 140 12/31/2020   K 4.7 12/31/2020   CL 104 12/31/2020   CALCIUM 9.0 12/31/2020   MG 2.2 09/12/2019   PHOS 3.3 05/15/2020    Bone No results found for: VD25OH, VD125OH2TOT, XU3833XO3, AN1916OM6, 25OHVITD1, 25OHVITD2, 25OHVITD3, TESTOFREE, TESTOSTERONE  Inflammation (CRP: Acute Phase) (ESR: Chronic Phase) No results found for: CRP, ESRSEDRATE, LATICACIDVEN       Note: Above Lab results reviewed.  Imaging  DG PAIN CLINIC C-ARM 1-60 MIN NO REPORT Fluoro was used, but no Radiologist interpretation will be provided.  Please refer to "NOTES" tab for provider progress note.  Assessment  The primary encounter diagnosis was Bilateral rotator cuff dysfunction. Diagnoses of History of shoulder surgery, History of rotator cuff surgery (bilateral), Chronic pain of both shoulders, and Chronic pain syndrome were also pertinent to this visit.  Plan of Care    Orders:  Orders Placed This Encounter  Procedures   SUPRASCAPULAR NERVE BLOCK    For shoulder pain.    Standing Status:   Future    Standing Expiration  Date:   07/03/2021    Scheduling Instructions:     Purpose: Diagnostic #2     Laterality: Bilateral     Level(s): Suprascapular notch     Without sedation    Order Specific Question:   Where will this procedure be performed?    Answer:   [facility]   Follow-up plan:   Return in about 9 days (around 04/14/2021) for B/L SSNB #2, in clinic [facility].    Recent Visits Date Type Provider Dept  03/29/21 Procedure visit Carlos Santa, MD [facility]  03/17/21 Office Visit Carlos Santa, MD [facility]  Showing recent visits within past 90 days and meeting all other requirements Today's Visits Date Type Provider Dept  04/05/21 Office Visit Carlos Santa, MD [facility]  Showing today's visits and meeting all other requirements Future Appointments Date Type Provider Dept  04/19/21 Appointment Carlos Santa, MD [facility]  Showing future appointments within next 90 days and meeting all other requirements  I discussed the assessment and treatment plan with the patient. The patient was provided an opportunity to ask questions and all were answered. The patient agreed with the plan and demonstrated an understanding of the instructions.  Patient advised to call back or seek an in-person evaluation if the symptoms or condition worsens.  Duration of encounter: 38mnutes.  Note by: BGillis Santa MD Date: 04/05/2021; Time: 2:53 PM

## 2021-04-05 NOTE — Telephone Encounter (Signed)
Daughter called stating patient is in extreme pain, injections did not help, wants to know when they can get the next injections? RFA?  Can call daughter or patient. Please advise patient

## 2021-04-05 NOTE — Telephone Encounter (Signed)
Bubble sent to BL

## 2021-04-12 ENCOUNTER — Telehealth: Payer: Self-pay

## 2021-04-12 NOTE — Telephone Encounter (Signed)
He went to his PCP and they gave him oxycodone, and  atodolic.

## 2021-04-12 NOTE — Telephone Encounter (Signed)
NOTED. We do not prescribe narcotics.

## 2021-04-19 ENCOUNTER — Telehealth: Payer: Medicare Other | Admitting: Student in an Organized Health Care Education/Training Program

## 2021-04-21 ENCOUNTER — Other Ambulatory Visit: Payer: Self-pay

## 2021-04-21 ENCOUNTER — Encounter: Payer: Self-pay | Admitting: Student in an Organized Health Care Education/Training Program

## 2021-04-21 ENCOUNTER — Ambulatory Visit (HOSPITAL_BASED_OUTPATIENT_CLINIC_OR_DEPARTMENT_OTHER): Payer: Medicare Other | Admitting: Student in an Organized Health Care Education/Training Program

## 2021-04-21 ENCOUNTER — Ambulatory Visit
Admission: RE | Admit: 2021-04-21 | Discharge: 2021-04-21 | Disposition: A | Discharge status: Home / Self Care | Payer: Medicare Other | Source: Ambulatory Visit | Attending: Student in an Organized Health Care Education/Training Program | Admitting: Student in an Organized Health Care Education/Training Program

## 2021-04-21 DIAGNOSIS — Z9889 Other specified postprocedural states: Secondary | ICD-10-CM | POA: Diagnosis not present

## 2021-04-21 DIAGNOSIS — M25512 Pain in left shoulder: Secondary | ICD-10-CM

## 2021-04-21 DIAGNOSIS — G8929 Other chronic pain: Secondary | ICD-10-CM | POA: Insufficient documentation

## 2021-04-21 DIAGNOSIS — G894 Chronic pain syndrome: Secondary | ICD-10-CM | POA: Diagnosis not present

## 2021-04-21 DIAGNOSIS — M25511 Pain in right shoulder: Secondary | ICD-10-CM

## 2021-04-21 DIAGNOSIS — M67912 Unspecified disorder of synovium and tendon, left shoulder: Secondary | ICD-10-CM | POA: Diagnosis not present

## 2021-04-21 DIAGNOSIS — M67911 Unspecified disorder of synovium and tendon, right shoulder: Secondary | ICD-10-CM | POA: Diagnosis present

## 2021-04-21 MED ORDER — LIDOCAINE HCL 2 % IJ SOLN
20.0000 mL | Freq: Once | INTRAMUSCULAR | Status: AC
  Administered 2021-04-21: 400 mg
  Filled 2021-04-21: qty 20

## 2021-04-21 MED ORDER — IOHEXOL 180 MG/ML  SOLN
10.0000 mL | Freq: Once | INTRAMUSCULAR | Status: AC
  Administered 2021-04-21: 5 mL via INTRA_ARTICULAR
  Filled 2021-04-21: qty 20

## 2021-04-21 MED ORDER — METHYLPREDNISOLONE ACETATE 80 MG/ML IJ SUSP
80.0000 mg | Freq: Once | INTRAMUSCULAR | Status: AC
  Administered 2021-04-21: 80 mg via INTRA_ARTICULAR
  Filled 2021-04-21: qty 1

## 2021-04-21 MED ORDER — ROPIVACAINE HCL 2 MG/ML IJ SOLN
9.0000 mL | Freq: Once | INTRAMUSCULAR | Status: AC
  Administered 2021-04-21: 20 mL via PERINEURAL

## 2021-04-21 NOTE — Patient Instructions (Signed)
____________________________________________________________________________________________  Post-procedure Information What to expect: Most procedures involve the use of a local anesthetic (numbing medicine), and a steroid (anti-inflammatory medicine).  The local anesthetics may cause temporary numbness and weakness of the legs or arms, depending on the location of the block. This numbness/weakness may last 4-6 hours, depending on the local anesthetic used. In rare instances, it can last up to 24 hours. While numb, you must be very careful not to injure the extremity.  After any procedure, you could expect the pain to get better within 15-20 minutes. This relief is temporary and may last 4-6 hours. Once the local anesthetics wears off, you could experience discomfort, possibly more than usual, for up to 10 (ten) days. In the case of radiofrequencies, it may last up to 6 weeks. Surgeries may take up to 8 weeks for the healing process. The discomfort is due to the irritation caused by needles going through skin and muscle. To minimize the discomfort, we recommend using ice the first day, and heat from then on. The ice should be applied for 15 minutes on, and 15 minutes off. Keep repeating this cycle until bedtime. Avoid applying the ice directly to the skin, to prevent frostbite. Heat should be used daily, until the pain improves (4-10 days). Be careful not to burn yourself.  Occasionally you may experience muscle spasms or cramps. These occur as a consequence of the irritation caused by the needle sticks to the muscle and the blood that will inevitably be lost into the surrounding muscle tissue. Blood tends to be very irritating to tissues, which tend to react by going into spasm. These spasms may start the same day of your procedure, but they may also take days to develop. This late onset type of spasm or cramp is usually caused by electrolyte imbalances triggered by the steroids, at the level of the  kidney. Cramps and spasms tend to respond well to muscle relaxants, multivitamins (some are triggered by the procedure, but may have their origins in vitamin deficiencies), and Gatorade, or any sports drinks that can replenish any electrolyte imbalances. (If you are a diabetic, ask your pharmacist to get you a sugar-free brand.) Warm showers or baths may also be helpful. Stretching exercises are highly recommended.  General Instructions:  Be alert for signs of possible infection: redness, swelling, heat, red streaks, elevated temperature, and/or fever. These typically appear 4 to 6 days after the procedure. Immediately notify your doctor if you experience unusual bleeding, difficulty breathing, or loss of bowel or bladder control. If you experience increased pain, do not increase your pain medicine intake, unless instructed by your pain physician.  Post-Procedure Care:  Be careful in moving about. Muscle spasms in the area of the injection may occur. Applying ice or heat to the area is often helpful. The incidence of spinal headaches after epidural injections ranges between 1.4% and 6%. If you develop a headache that does not seem to respond to conservative therapy, please let your physician know. This can be treated with an epidural blood patch.   Post-procedure numbness or redness is to be expected, however it should average 4 to 6 hours. If numbness and weakness of your extremities begins to develop 4 to 6 hours after your procedure, and is felt to be progressing and worsening, immediately contact your physician.  Diet:  If you experience nausea, do not eat until this sensation goes away. If you had a Stellate Ganglion Block for upper extremity Reflex Sympathetic Dystrophy, do not eat or  drink until your hoarseness goes away. In any case, always start with liquids first and if you tolerate them well, then slowly progress to more solid foods.  Activity:  For the first 4 to 6 hours after the  procedure, use caution in moving about as you may experience numbness and/or weakness. Use caution in cooking, using household electrical appliances, and climbing steps. If you need to reach your Doctor call our office: [phone] (During business hours) or [phone] (After business hours).  Business Hours: Monday-Thursday 8:00 am - 4:00 PM    Fridays: Closed     In case of an emergency: In case of emergency, call 911 or go to the nearest emergency room and have the physician there call us.  Interpretation of Procedure Every nerve block has two components: a diagnostic component, and a treatment component. Unrealistic expectations are the most common causes of perceived failure.  In a perfect world, a single nerve block should be able to completely and permanently eliminate the pain. Sadly, the world is not perfect.  Most pain management nerve blocks are performed using local anesthetics and steroids. Steroids are responsible for any long-term benefit that you may experience. Their purpose is to decrease any chronic swelling that may exist in the area. Steroids begin to work immediately after being injected. However, most patients will not experience any benefits until 5 to 10 days after the injection, when the swelling has come down to the point where they can tell a difference. Steroids will only help if there is swelling to be treated. As such, they can assist with the diagnosis. If effective, they suggest an inflammatory component to the pain, and if ineffective, they rule out inflammation as the main cause or component of the problem. If the problem is one of mechanical compression, you will get no benefit from those steroids.   In the case of local anesthetics, they have a crucial role in the diagnosis of your condition. Most will begin to work within15 to 20 minutes after injection. The duration will depend on the type used (short- vs. Long-acting). It is of outmost importance that  patients keep tract of their pain, after the procedure. To assist with this matter, a Post-procedure Pain Diary is provided. Make sure to complete it and to bring it back to your follow-up appointment.  As long as the patient keeps accurate, detailed records of their symptoms after every procedure, and returns to have those interpreted, every procedure will provide [place] with invaluable information. Even a block that does not provide the patient with any relief, will always provide [place] with information about the mechanism and the origin of the pain. The only time a nerve block can be considered a waste of time is when patients do not keep track of the results, or do not keep their post-procedure appointment.  Reporting the results back to your physician The Pain Score  Pain is a subjective complaint. It cannot be seen, touched, or measured. We depend entirely on the patients report of the pain in order to assess your condition and treatment. To evaluate the pain, we use a pain scale, where 0 means No Pain, and a 10 is the worst possible pain that you can even imagine (i.e. something like been eaten alive by a shark or being torn apart by a lion).   Use the Pain Scale provided. You will frequently be asked to rate your pain. Please be accurate, remember that medical decisions will be based on your  responses. Please do not rate your pain above a 10. Doing so is actually interpreted as symptom magnification (exaggeration). To put this into perspective, when you tell us that your pain is at a 10 (ten), what you are saying is that there is nothing we can do to make this pain any worse. (Carefully think about that.) ____________________________________________________________________________________________  Carlos [name] may restart your Eliquis tomorrow.

## 2021-04-21 NOTE — Progress Notes (Signed)
PROVIDER NOTE: Interpretation of information contained herein should be left to medically-trained personnel. Specific patient instructions are provided elsewhere under "Patient Instructions" section of medical record. This document was created in part using STT-dictation technology, any transcriptional errors that may result from this process are unintentional.  Patient: Carlos [name] Type: Established DOB: 19-Aug-1926 MRN: [id number] PCP: [name], MD  Service: Procedure DOS: 04/21/2021 Setting: Ambulatory Location: Ambulatory outpatient facility Delivery: Face-to-face Provider: Gillis Santa, MD Specialty: Interventional Pain Management Specialty designation: 09 Location: Outpatient facility Ref. Prov.: Gillis Santa, MD    Primary Reason for Visit: Interventional Pain Management Treatment. CC: No chief complaint on file.  Procedure:           Type: Suprascapular nerve block #1  Laterality:  Bilateral Level: Superior to scapular spine, lateral to supraspinatus fossa (Suprascapular notch).  Purpose: Diagnostic/Therapeutic Date: 04/21/2021 Performed by: Gillis Santa, MD Anesthesia: Local (1-2% Lidocaine)  Anxiolysis: None  Sedation: None  Guidance: Fluoroscopy          Indications: Shoulder pain, severe enough to impact quality of life and/or function. 1. Bilateral rotator cuff dysfunction   2. History of shoulder surgery   3. History of rotator cuff surgery (bilateral)   4. Chronic pain of both shoulders   5. Chronic pain syndrome    NAS-11 score:   Pre-procedure: 8 /10   Post-procedure: 8 /10     Target: Suprascapular nerve Location: midway between the medial border of the scapula and the acromion as it runs through the suprascapular notch. Region: Suprascapular, posterior shoulder  Approach: Percutaneous  Neuroanatomy: The suprascapular nerve is the lateral branch of the superior trunk of the brachial plexus. It receives nerve fibers that originate in the nerve  roots C5 and C6 (and sometimes C4). It is a mixed nerve, meaning that it provides both sensory and motor supply for the suprascapular region. Function: The main function of this nerve is to provide motor innervation for two muscles, the supraspinatus and infraspinatus muscles. They are part of the rotator cuff muscles. In addition, the suprascapular nerve provides a sensory supply to the joints of the scapula (glenohumeral and acromioclavicular joints). Rationale (medical necessity): procedure needed and proper for the diagnosis and/or treatment of the patient's medical symptoms and needs.  Position / Prep / Materials:  Position: Prone Materials:  Tray: Block Needle(s):  Type: Spinal  Gauge (G): 22  Length: 3.5 in.  Qty: 1 Prep solution: DuraPrep (Iodine Povacrylex [0.7% available iodine] and Isopropyl Alcohol, 74% w/w) Prep Area: Entire posterior shoulder area. From upper spine to shoulder proper (upper arm), and from lateral neck to lower tip of shoulder blade.   Pre-op H&P Assessment:  [name] is a 88 y.o. (year old), male patient, seen today for interventional treatment. He  has a past surgical history that includes Lung surgery (Left); Gallbladder surgery; Hernia repair; Replacement total knee; Femur Surgery; Cardiac surgery; Coronary artery bypass graft; Cardiac catheterization; Cholecystectomy; Joint replacement (Right); Cystoscopy with litholapaxy (N/A, 12/03/2014); and Cystoscopy w/ retrogrades (N/A, 12/03/2014). [name] has a current medication list which includes the following prescription(s): allopurinol, apixaban, finasteride, gabapentin, loratadine, metoprolol succinate, multi-vitamins, pantoprazole, simvastatin, tamsulosin, aspirin ec, ferrous sulfate, losartan, fish oil, torsemide, tramadol, and [DISCONTINUED] colchicine. His primarily concern today is the No chief complaint on file.  Initial Vital Signs:  Pulse/HCG Rate: 63  Temp: (!) 97 F (36.1 C) Resp:   BP: 139/63 SpO2:  94 %  BMI: Estimated body mass index is 32.42 kg/m as calculated from the  following:   Height as of this encounter: 5\' 7"  (1.702 m).   Weight as of this encounter: 207 lb (93.9 kg).  Risk Assessment: Allergies: Reviewed. He has no active allergies.  Allergy Precautions: None required Coagulopathies: Reviewed. None identified.  Blood-thinner therapy: None at this time Active Infection(s): Reviewed. None identified. [name] is afebrile  Site Confirmation: [name] was asked to confirm the procedure and laterality before marking the site Procedure checklist: Completed Consent: Before the procedure and under the influence of no sedative(s), amnesic(s), or anxiolytics, the patient was informed of the treatment options, risks and possible complications. To fulfill our ethical and legal obligations, as recommended by the American Medical Association's Code of Ethics, I have informed the patient of my clinical impression; the nature and purpose of the treatment or procedure; the risks, benefits, and possible complications of the intervention; the alternatives, including doing nothing; the risk(s) and benefit(s) of the alternative treatment(s) or procedure(s); and the risk(s) and benefit(s) of doing nothing. The patient was provided information about the general risks and possible complications associated with the procedure. These may include, but are not limited to: failure to achieve desired goals, infection, bleeding, organ or nerve damage, allergic reactions, paralysis, and death. In addition, the patient was informed of those risks and complications associated to the procedure, such as failure to decrease pain; infection; bleeding; organ or nerve damage with subsequent damage to sensory, motor, and/or autonomic systems, resulting in permanent pain, numbness, and/or weakness of one or several areas of the body; allergic reactions; (i.e.: anaphylactic reaction); and/or death. Furthermore, the patient  was informed of those risks and complications associated with the medications. These include, but are not limited to: allergic reactions (i.e.: anaphylactic or anaphylactoid reaction(s)); adrenal axis suppression; blood sugar elevation that in diabetics may result in ketoacidosis or comma; water retention that in patients with history of congestive heart failure may result in shortness of breath, pulmonary edema, and decompensation with resultant heart failure; weight gain; swelling or edema; medication-induced neural toxicity; particulate matter embolism and blood vessel occlusion with resultant organ, and/or nervous system infarction; and/or aseptic necrosis of one or more joints. Finally, the patient was informed that Medicine is not an exact science; therefore, there is also the possibility of unforeseen or unpredictable risks and/or possible complications that may result in a catastrophic outcome. The patient indicated having understood very clearly. We have given the patient no guarantees and we have made no promises. Enough time was given to the patient to ask questions, all of which were answered to the patient's satisfaction. [name] has indicated that he wanted to continue with the procedure. Attestation: I, the ordering provider, attest that I have discussed with the patient the benefits, risks, side-effects, alternatives, likelihood of achieving goals, and potential problems during recovery for the procedure that I have provided informed consent. Date   Time: 04/21/2021 10:53 AM  Pre-Procedure Preparation:  Monitoring: As per clinic protocol. Respiration, ETCO2, SpO2, BP, heart rate and rhythm monitor placed and checked for adequate function Safety Precautions: Patient was assessed for positional comfort and pressure points before starting the procedure. Time-out: I initiated and conducted the "Time-out" before starting the procedure, as per protocol. The patient was asked to participate by  confirming the accuracy of the "Time Out" information. Verification of the correct person, site, and procedure were performed and confirmed by me, the nursing staff, and the patient. "Time-out" conducted as per Joint Commission's Universal Protocol (UP.01.01.01). Time:    Description of Procedure:  Procedural Technique Safety Precautions: Aspiration looking for blood return was conducted prior to all injections. At no point did we inject any substances, as a needle was being advanced. No attempts were made at seeking any paresthesias. Safe injection practices and needle disposal techniques used. Medications properly checked for expiration dates. SDV (single dose vial) medications used. Description of the Procedure: Protocol guidelines were followed. The patient was placed in position over the procedure table. The target area was identified and the area prepped in the usual manner. Skin & deeper tissues infiltrated with local anesthetic. Appropriate amount of time allowed to pass for local anesthetics to take effect. The procedure needles were then advanced to the target area. Proper needle placement secured. Negative aspiration confirmed. Solution injected in intermittent fashion, asking for systemic symptoms every 0.5cc of injectate. The needles were then removed and the area cleansed, making sure to leave some of the prepping solution back to take advantage of its long term bactericidal properties.  Vitals:   04/21/21 1109  BP: 139/63  Pulse: 63  Temp: (!) 97 F (36.1 C)  SpO2: 94%  Weight: 207 lb (93.9 kg)  Height: 5\' 7"  (1.702 m)    Start Time:   hrs. End Time:   hrs. Materials:  Needle(s) Type: Spinal Needle Gauge: 25G Length: 3.5-in Medication(s): Please see orders for medications and dosing details. 10 cc solution made of 8 cc of 0.2% ropivacaine, 2 cc of Decadron 10 mg/cc. 5 cc injected at each level bilaterally after contrast confirmation    Imaging Guidance (Non-Spinal):           Type of Imaging Technique: Fluoroscopy Guidance (Non-Spinal) Indication(s): Assistance in needle guidance and placement for procedures requiring needle placement in or near specific anatomical locations not easily accessible without such assistance. Exposure Time: Please see nurses notes. Contrast: Before injecting any contrast, we confirmed that the patient did not have an allergy to iodine, shellfish, or radiological contrast. Once satisfactory needle placement was completed at the desired level, radiological contrast was injected. Contrast injected under live fluoroscopy. No contrast complications. See chart for type and volume of contrast used. Fluoroscopic Guidance: I was personally present during the use of fluoroscopy. "Tunnel Vision Technique" used to obtain the best possible view of the target area. Parallax error corrected before commencing the procedure. "Direction-depth-direction" technique used to introduce the needle under continuous pulsed fluoroscopy. Once target was reached, antero-posterior, oblique, and lateral fluoroscopic projection used confirm needle placement in all planes. Images permanently stored in EMR. Interpretation: I personally interpreted the imaging intraoperatively. Adequate needle placement confirmed in multiple planes. Appropriate spread of contrast into desired area was observed. No evidence of afferent or efferent intravascular uptake. Permanent images saved into the patient's record.  Post-operative Assessment:  Post-procedure Vital Signs:  Pulse/HCG Rate: 63  Temp:  (!) 97 F (36.1 C) Resp:   BP:  139/63 SpO2: 94 %  EBL: None  Complications: No immediate post-treatment complications observed by team, or reported by patient.  Note: The patient tolerated the entire procedure well. A repeat set of vitals were taken after the procedure and the patient was kept under observation following institutional policy, for this type of procedure.  Post-procedural neurological assessment was performed, showing return to baseline, prior to discharge. The patient was provided with post-procedure discharge instructions, including a section on how to identify potential problems. Should any problems arise concerning this procedure, the patient was given instructions to immediately contact us, at any time, without hesitation. In any case, we  plan to contact the patient by telephone for a follow-up status report regarding this interventional procedure.  Comments:  No additional relevant information.  Plan of Care  Orders:  No orders of the defined types were placed in this encounter.   Medications ordered for procedure: No orders of the defined types were placed in this encounter.  Medications administered: Adarius Tigges had no medications administered during this visit.  See the medical record for exact dosing, route, and time of administration.  Follow-up plan:   No follow-ups on file.      Recent Visits Date Type Provider Dept  04/05/21 Office Visit Gillis Santa, MD [facility]  03/29/21 Procedure visit Gillis Santa, MD [facility]  03/17/21 Office Visit Gillis Santa, MD [facility]  Showing recent visits within past 90 days and meeting all other requirements Today's Visits Date Type Provider Dept  04/21/21 Procedure visit Gillis Santa, MD [facility]  Showing today's visits and meeting all other requirements Future Appointments No visits were found meeting these conditions. Showing future appointments within next 90 days and meeting all other requirements  Disposition: Discharge home  Discharge (Date   Time): 04/21/2021;   hrs.   Primary Care Physician: [name], MD Location: [facility] Note by: Gillis Santa, MD Date: 04/21/2021; Time: 11:21 AM  Disclaimer:  Medicine is not an exact science. The only guarantee in medicine is that nothing is  guaranteed. It is important to note that the decision to proceed with this intervention was based on the information collected from the patient. The Data and conclusions were drawn from the patient's questionnaire, the interview, and the physical examination. Because the information was provided in large part by the patient, it cannot be guaranteed that it has not been purposely or unconsciously manipulated. Every effort has been made to obtain as much relevant data as possible for this evaluation. It is important to note that the conclusions that lead to this procedure are derived in large part from the available data. Always take into account that the treatment will also be dependent on availability of resources and existing treatment guidelines, considered by other Pain Management Practitioners as being common knowledge and practice, at the time of the intervention. For Medico-Legal purposes, it is also important to point out that variation in procedural techniques and pharmacological choices are the acceptable norm. The indications, contraindications, technique, and results of the above procedure should only be interpreted and judged by a Board-Certified Interventional Pain Specialist with extensive familiarity and expertise in the same exact procedure and technique.

## 2021-04-21 NOTE — Progress Notes (Signed)
PROVIDER NOTE: Interpretation of information contained herein should be left to medically-trained personnel. Specific patient instructions are provided elsewhere under "Patient Instructions" section of medical record. This document was created in part using STT-dictation technology, any transcriptional errors that may result from this process are unintentional.  Patient: Carlos [name] Type: Established DOB: Nov 25, 1926 MRN: [id number] PCP: [name], MD  Service: Procedure DOS: 04/21/2021 Setting: Ambulatory Location: Ambulatory outpatient facility Delivery: Face-to-face Provider: Gillis Santa, MD Specialty: Interventional Pain Management Specialty designation: 09 Location: Outpatient facility Ref. Prov.: Gillis Santa, MD    Primary Reason for Visit: Interventional Pain Management Treatment. CC: Bilateral shoulder pain  Procedure:           Type: Suprascapular nerve block #2 (#1 done 03/29/2021) Laterality:  Bilateral Level: Superior to scapular spine, lateral to supraspinatus fossa (Suprascapular notch).  Purpose: Diagnostic/Therapeutic Date: 04/21/2021 Performed by: Gillis Santa, MD Anesthesia: Local (1-2% Lidocaine)  Anxiolysis: None  Sedation: None  Guidance: Fluoroscopy          Indications: Shoulder pain, severe enough to impact quality of life and/or function. 1. Bilateral rotator cuff dysfunction   2. History of shoulder surgery   3. History of rotator cuff surgery (bilateral)   4. Chronic pain of both shoulders   5. Chronic pain syndrome    NAS-11 score:   Pre-procedure: 8 /10   Post-procedure: 8 /10     Target: Suprascapular nerve Location: midway between the medial border of the scapula and the acromion as it runs through the suprascapular notch. Region: Suprascapular, posterior shoulder  Approach: Percutaneous  Neuroanatomy: The suprascapular nerve is the lateral branch of the superior trunk of the brachial plexus. It receives nerve fibers that originate  in the nerve roots C5 and C6 (and sometimes C4). It is a mixed nerve, meaning that it provides both sensory and motor supply for the suprascapular region. Function: The main function of this nerve is to provide motor innervation for two muscles, the supraspinatus and infraspinatus muscles. They are part of the rotator cuff muscles. In addition, the suprascapular nerve provides a sensory supply to the joints of the scapula (glenohumeral and acromioclavicular joints). Rationale (medical necessity): procedure needed and proper for the diagnosis and/or treatment of the patient's medical symptoms and needs.  Position / Prep / Materials:  Position: Prone Materials:  Tray: Block Needle(s):  Type: Spinal  Gauge (G): 22  Length: 3.5 in.  Qty: 1 Prep solution: DuraPrep (Iodine Povacrylex [0.7% available iodine] and Isopropyl Alcohol, 74% w/w) Prep Area: Entire posterior shoulder area. From upper spine to shoulder proper (upper arm), and from lateral neck to lower tip of shoulder blade.   Pre-op H&P Assessment:  Carlos [name] is a 88 y.o. (year old), male patient, seen today for interventional treatment. He  has a past surgical history that includes Lung surgery (Left); Gallbladder surgery; Hernia repair; Replacement total knee; Femur Surgery; Cardiac surgery; Coronary artery bypass graft; Cardiac catheterization; Cholecystectomy; Joint replacement (Right); Cystoscopy with litholapaxy (N/A, 12/03/2014); and Cystoscopy w/ retrogrades (N/A, 12/03/2014). [name] has a current medication list which includes the following prescription(s): allopurinol, apixaban, finasteride, gabapentin, loratadine, metoprolol succinate, multi-vitamins, pantoprazole, simvastatin, tamsulosin, aspirin ec, ferrous sulfate, losartan, fish oil, torsemide, tramadol, and [DISCONTINUED] colchicine. His primarily concern today is the No chief complaint on file.  Initial Vital Signs:  Pulse/HCG Rate: 63ECG Heart Rate: 62 Temp: (!) 97 F (36.1  C) Resp: 13 BP: 139/63 SpO2: 94 %  BMI: Estimated body mass index is 32.42 kg/m as calculated from  the following:   Height as of this encounter: 5\' 7"  (1.702 m).   Weight as of this encounter: 207 lb (93.9 kg).  Risk Assessment: Allergies: Reviewed. He has no active allergies.  Allergy Precautions: None required Coagulopathies: Reviewed. None identified.  Blood-thinner therapy: None at this time Active Infection(s): Reviewed. None identified. [name] is afebrile  Site Confirmation: Carlos [name] was asked to confirm the procedure and laterality before marking the site Procedure checklist: Completed Consent: Before the procedure and under the influence of no sedative(s), amnesic(s), or anxiolytics, the patient was informed of the treatment options, risks and possible complications. To fulfill our ethical and legal obligations, as recommended by the American Medical Association's Code of Ethics, I have informed the patient of my clinical impression; the nature and purpose of the treatment or procedure; the risks, benefits, and possible complications of the intervention; the alternatives, including doing nothing; the risk(s) and benefit(s) of the alternative treatment(s) or procedure(s); and the risk(s) and benefit(s) of doing nothing. The patient was provided information about the general risks and possible complications associated with the procedure. These may include, but are not limited to: failure to achieve desired goals, infection, bleeding, organ or nerve damage, allergic reactions, paralysis, and death. In addition, the patient was informed of those risks and complications associated to the procedure, such as failure to decrease pain; infection; bleeding; organ or nerve damage with subsequent damage to sensory, motor, and/or autonomic systems, resulting in permanent pain, numbness, and/or weakness of one or several areas of the body; allergic reactions; (i.e.: anaphylactic reaction); and/or  death. Furthermore, the patient was informed of those risks and complications associated with the medications. These include, but are not limited to: allergic reactions (i.e.: anaphylactic or anaphylactoid reaction(s)); adrenal axis suppression; blood sugar elevation that in diabetics may result in ketoacidosis or comma; water retention that in patients with history of congestive heart failure may result in shortness of breath, pulmonary edema, and decompensation with resultant heart failure; weight gain; swelling or edema; medication-induced neural toxicity; particulate matter embolism and blood vessel occlusion with resultant organ, and/or nervous system infarction; and/or aseptic necrosis of one or more joints. Finally, the patient was informed that Medicine is not an exact science; therefore, there is also the possibility of unforeseen or unpredictable risks and/or possible complications that may result in a catastrophic outcome. The patient indicated having understood very clearly. We have given the patient no guarantees and we have made no promises. Enough time was given to the patient to ask questions, all of which were answered to the patient's satisfaction. Mr. Vu has indicated that he wanted to continue with the procedure. Attestation: I, the ordering provider, attest that I have discussed with the patient the benefits, risks, side-effects, alternatives, likelihood of achieving goals, and potential problems during recovery for the procedure that I have provided informed consent. Date   Time: 04/21/2021 10:53 AM  Pre-Procedure Preparation:  Monitoring: As per clinic protocol. Respiration, ETCO2, SpO2, BP, heart rate and rhythm monitor placed and checked for adequate function Safety Precautions: Patient was assessed for positional comfort and pressure points before starting the procedure. Time-out: I initiated and conducted the "Time-out" before starting the procedure, as per protocol. The patient  was asked to participate by confirming the accuracy of the "Time Out" information. Verification of the correct person, site, and procedure were performed and confirmed by me, the nursing staff, and the patient. "Time-out" conducted as per Joint Commission's Universal Protocol (UP.01.01.01). Time: 1148  Description of Procedure:  Procedural Technique Safety Precautions: Aspiration looking for blood return was conducted prior to all injections. At no point did we inject any substances, as a needle was being advanced. No attempts were made at seeking any paresthesias. Safe injection practices and needle disposal techniques used. Medications properly checked for expiration dates. SDV (single dose vial) medications used. Description of the Procedure: Protocol guidelines were followed. The patient was placed in position over the procedure table. The target area was identified and the area prepped in the usual manner. Skin & deeper tissues infiltrated with local anesthetic. Appropriate amount of time allowed to pass for local anesthetics to take effect. The procedure needles were then advanced to the target area. Proper needle placement secured. Negative aspiration confirmed. Solution injected in intermittent fashion, asking for systemic symptoms every 0.5cc of injectate. The needles were then removed and the area cleansed, making sure to leave some of the prepping solution back to take advantage of its long term bactericidal properties.  Vitals:   04/21/21 1109 04/21/21 1150  BP: 139/63 (!) 150/61  Pulse: 63   Resp:  13  Temp: (!) 97 F (36.1 C)   SpO2: 94% 95%  Weight: 207 lb (93.9 kg)   Height: 5\' 7"  (1.702 m)     Start Time: 1148 hrs. End Time: 1153 hrs. Materials:  Needle(s) Type: Spinal Needle Gauge: 25G Length: 3.5-in Medication(s): Please see orders for medications and dosing details. 10 cc solution made of 9 cc of 0.2% ropivacaine, 1 cc of methylprednisolone, 80 mg/cc 5 cc  injected at each level bilaterally after contrast confirmation    Imaging Guidance (Non-Spinal):          Type of Imaging Technique: Fluoroscopy Guidance (Non-Spinal) Indication(s): Assistance in needle guidance and placement for procedures requiring needle placement in or near specific anatomical locations not easily accessible without such assistance. Exposure Time: Please see nurses notes. Contrast: Before injecting any contrast, we confirmed that the patient did not have an allergy to iodine, shellfish, or radiological contrast. Once satisfactory needle placement was completed at the desired level, radiological contrast was injected. Contrast injected under live fluoroscopy. No contrast complications. See chart for type and volume of contrast used. Fluoroscopic Guidance: I was personally present during the use of fluoroscopy. "Tunnel Vision Technique" used to obtain the best possible view of the target area. Parallax error corrected before commencing the procedure. "Direction-depth-direction" technique used to introduce the needle under continuous pulsed fluoroscopy. Once target was reached, antero-posterior, oblique, and lateral fluoroscopic projection used confirm needle placement in all planes. Images permanently stored in EMR. Interpretation: I personally interpreted the imaging intraoperatively. Adequate needle placement confirmed in multiple planes. Appropriate spread of contrast into desired area was observed. No evidence of afferent or efferent intravascular uptake. Permanent images saved into the patient's record.  Post-operative Assessment:  Post-procedure Vital Signs:  Pulse/HCG Rate: 6362 Temp:  (!) 97 F (36.1 C) Resp: 13 BP:  (!) 150/61 SpO2: 95 %  EBL: None  Complications: No immediate post-treatment complications observed by team, or reported by patient.  Note: The patient tolerated the entire procedure well. A repeat set of vitals were taken after the procedure and the  patient was kept under observation following institutional policy, for this type of procedure. Post-procedural neurological assessment was performed, showing return to baseline, prior to discharge. The patient was provided with post-procedure discharge instructions, including a section on how to identify potential problems. Should any problems arise concerning this procedure, the patient was given instructions to immediately contact  us, at any time, without hesitation. In any case, we plan to contact the patient by telephone for a follow-up status report regarding this interventional procedure.  Comments:  No additional relevant information.  Plan of Care  Orders:  Orders Placed This Encounter  Procedures   DG PAIN [facility] C-ARM 1-60 MIN NO REPORT    Intraoperative interpretation by procedural physician at [place].    Standing Status:   Standing    Number of Occurrences:   1    Order Specific Question:   Reason for exam:    Answer:   Assistance in needle guidance and placement for procedures requiring needle placement in or near specific anatomical locations not easily accessible without such assistance.    Medications ordered for procedure: Meds ordered this encounter  Medications   iohexol (OMNIPAQUE) 180 MG/ML injection 10 mL    Must be Myelogram-compatible. If not available, you may substitute with a water-soluble, non-ionic, hypoallergenic, myelogram-compatible radiological contrast medium.   lidocaine (XYLOCAINE) 2 % (with pres) injection 400 mg   methylPREDNISolone acetate (DEPO-MEDROL) injection 80 mg   ropivacaine (PF) 2 mg/mL (0.2%) (NAROPIN) injection 9 mL   Medications administered: We administered iohexol, lidocaine, methylPREDNISolone acetate, and ropivacaine (PF) 2 mg/mL (0.2%).  See the medical record for exact dosing, route, and time of administration.  Follow-up plan:   Return in about 4 weeks (around 05/19/2021) for Post Procedure Evaluation, virtual.       Recent Visits Date Type Provider Dept  04/05/21 Office Visit Gillis Santa, MD [facility]  03/29/21 Procedure visit Gillis Santa, MD [facility]  03/17/21 Office Visit Gillis Santa, MD [facility]  Showing recent visits within past 90 days and meeting all other requirements Today's Visits Date Type Provider Dept  04/21/21 Procedure visit Gillis Santa, MD [facility]  Showing today's visits and meeting all other requirements Future Appointments Date Type Provider Dept  05/19/21 Appointment Gillis Santa, MD [facility]  Showing future appointments within next 90 days and meeting all other requirements  Disposition: Discharge home  Discharge (Date   Time): 04/21/2021; 1200 hrs.   Primary Care Physician: [name], MD Location: [facility] Note by: Gillis Santa, MD Date: 04/21/2021; Time: 12:31 PM  Disclaimer:  Medicine is not an exact science. The only guarantee in medicine is that nothing is guaranteed. It is important to note that the decision to proceed with this intervention was based on the information collected from the patient. The Data and conclusions were drawn from the patient's questionnaire, the interview, and the physical examination. Because the information was provided in large part by the patient, it cannot be guaranteed that it has not been purposely or unconsciously manipulated. Every effort has been made to obtain as much relevant data as possible for this evaluation. It is important to note that the conclusions that lead to this procedure are derived in large part from the available data. Always take into account that the treatment will also be dependent on availability of resources and existing treatment guidelines, considered by other Pain Management Practitioners as being common knowledge and practice, at the time of the intervention. For Medico-Legal purposes, it is also  important to point out that variation in procedural techniques and pharmacological choices are the acceptable norm. The indications, contraindications, technique, and results of the above procedure should only be interpreted and judged by a Board-Certified Interventional Pain Specialist with extensive familiarity and expertise in the same exact  procedure and technique.

## 2021-04-22 ENCOUNTER — Telehealth: Payer: Self-pay

## 2021-04-22 NOTE — Telephone Encounter (Signed)
Called PP.Daughter asked pt  and he states he feels good today. Instructed to call if needed.

## 2021-04-23 ENCOUNTER — Other Ambulatory Visit: Payer: Self-pay | Admitting: Student

## 2021-04-23 DIAGNOSIS — I5022 Chronic systolic (congestive) heart failure: Secondary | ICD-10-CM

## 2021-04-23 DIAGNOSIS — I2510 Atherosclerotic heart disease of native coronary artery without angina pectoris: Secondary | ICD-10-CM

## 2021-04-28 ENCOUNTER — Encounter
Admission: RE | Admit: 2021-04-28 | Discharge: 2021-04-28 | Disposition: A | Discharge status: Home / Self Care | Payer: Medicare Other | Source: Ambulatory Visit | Attending: Student | Admitting: Student

## 2021-04-28 ENCOUNTER — Other Ambulatory Visit: Payer: Self-pay

## 2021-04-28 ENCOUNTER — Ambulatory Visit
Admission: RE | Admit: 2021-04-28 | Discharge: 2021-04-28 | Disposition: A | Discharge status: Home / Self Care | Payer: Medicare Other | Source: Ambulatory Visit | Attending: Student | Admitting: Student

## 2021-04-28 DIAGNOSIS — I110 Hypertensive heart disease with heart failure: Secondary | ICD-10-CM | POA: Diagnosis not present

## 2021-04-28 DIAGNOSIS — I4891 Unspecified atrial fibrillation: Secondary | ICD-10-CM | POA: Diagnosis not present

## 2021-04-28 DIAGNOSIS — I5022 Chronic systolic (congestive) heart failure: Secondary | ICD-10-CM | POA: Diagnosis present

## 2021-04-28 DIAGNOSIS — I252 Old myocardial infarction: Secondary | ICD-10-CM | POA: Diagnosis not present

## 2021-04-28 DIAGNOSIS — I2510 Atherosclerotic heart disease of native coronary artery without angina pectoris: Secondary | ICD-10-CM | POA: Insufficient documentation

## 2021-04-28 LAB — ECHOCARDIOGRAM COMPLETE
AR max vel: 1.95 cm2
AV Area VTI: 2.08 cm2
AV Area mean vel: 1.89 cm2
AV Mean grad: 3 mmHg
AV Peak grad: 6.1 mmHg
Ao pk vel: 1.23 m/s
Area-P 1/2: 2.86 cm2
MV VTI: 1.43 cm2
S' Lateral: 3.4 cm

## 2021-04-28 MED ORDER — REGADENOSON 0.4 MG/5ML IV SOLN
0.4000 mg | Freq: Once | INTRAVENOUS | Status: AC
  Administered 2021-04-28: 0.4 mg via INTRAVENOUS

## 2021-04-28 MED ORDER — TECHNETIUM TC 99M TETROFOSMIN IV KIT
31.9200 | PACK | Freq: Once | INTRAVENOUS | Status: AC | PRN
  Administered 2021-04-28: 31.92 via INTRAVENOUS

## 2021-04-28 MED ORDER — TECHNETIUM TC 99M TETROFOSMIN IV KIT
10.0000 | PACK | Freq: Once | INTRAVENOUS | Status: AC | PRN
  Administered 2021-04-28: 10.5 via INTRAVENOUS

## 2021-04-28 NOTE — Progress Notes (Signed)
*  PRELIMINARY RESULTS* ?Echocardiogram ?2D Echocardiogram has been performed. ? ?Clary Meeker, [place] ?04/28/2021, 9:40 AM ?

## 2021-04-29 LAB — NM MYOCAR MULTI W/SPECT W/WALL MOTION / EF
Base ST Depression (mm): 0 mm
Estimated workload: 1
Exercise duration (min): 1 min
Exercise duration (sec): 19 s
LV dias vol: 125 mL (ref 62–150)
LV sys vol: 46 mL
MPHR: 77 {beats}/min
Nuc Stress EF: 63 %
Peak HR: 77 {beats}/min
Percent HR: 60 %
Rest HR: 61 {beats}/min
Rest Nuclear Isotope Dose: 10.5 mCi
SDS: 7
SRS: 19
SSS: 22
ST Depression (mm): 0 mm
Stress Nuclear Isotope Dose: 31.9 mCi
TID: 1.01

## 2021-05-10 ENCOUNTER — Telehealth: Payer: Self-pay | Admitting: Primary Care

## 2021-05-11 NOTE — Telephone Encounter (Signed)
Spoke with patient's daughter Manuela Schwartz, regarding the Palliative referral/services and all questions were answered and she was in agreement with scheduling visit.  I scheduled an In-home Consult for 06/17/21 @ 9 AM (per daughter's request).   ?

## 2021-05-19 ENCOUNTER — Ambulatory Visit (HOSPITAL_BASED_OUTPATIENT_CLINIC_OR_DEPARTMENT_OTHER): Payer: Medicare Other | Admitting: Student in an Organized Health Care Education/Training Program

## 2021-05-19 DIAGNOSIS — M67911 Unspecified disorder of synovium and tendon, right shoulder: Secondary | ICD-10-CM

## 2021-05-19 DIAGNOSIS — M67912 Unspecified disorder of synovium and tendon, left shoulder: Secondary | ICD-10-CM

## 2021-05-20 NOTE — Progress Notes (Signed)
No appointment needed.

## 2021-06-17 ENCOUNTER — Other Ambulatory Visit: Payer: Medicare Other | Admitting: Primary Care

## 2021-06-17 DIAGNOSIS — N184 Chronic kidney disease, stage 4 (severe): Secondary | ICD-10-CM

## 2021-06-17 DIAGNOSIS — I5022 Chronic systolic (congestive) heart failure: Secondary | ICD-10-CM

## 2021-06-17 DIAGNOSIS — M51369 Other intervertebral disc degeneration, lumbar region without mention of lumbar back pain or lower extremity pain: Secondary | ICD-10-CM

## 2021-06-17 DIAGNOSIS — Z515 Encounter for palliative care: Secondary | ICD-10-CM

## 2021-06-17 DIAGNOSIS — E1122 Type 2 diabetes mellitus with diabetic chronic kidney disease: Secondary | ICD-10-CM

## 2021-06-17 DIAGNOSIS — R600 Localized edema: Secondary | ICD-10-CM

## 2021-06-17 DIAGNOSIS — I872 Venous insufficiency (chronic) (peripheral): Secondary | ICD-10-CM

## 2021-06-17 DIAGNOSIS — G894 Chronic pain syndrome: Secondary | ICD-10-CM

## 2021-06-17 DIAGNOSIS — M5136 Other intervertebral disc degeneration, lumbar region: Secondary | ICD-10-CM

## 2021-06-17 NOTE — Progress Notes (Signed)
 Therapist, nutritional Palliative Care Consult Note Telephone: [phone]  Fax: [phone]   Date of encounter: 06/17/21 9:41 AM PATIENT NAME: Carlos [name] [address] [place] [address]   [phone] (home) [phone] (work) DOB: October 28, 1926 MRN: [id number] PRIMARY CARE PROVIDER:    Jeffie Cheryl BRAVO, MD,  [address] [facility] [place] [address] [phone]  REFERRING PROVIDER:   Jeffie Cheryl BRAVO, MD [address] [place],  [place] [address] [phone]  RESPONSIBLE PARTY:    Contact Information     Name Relation Home Work [place] Daughter [phone]  [phone]   Cheri Iris Benne [phone]          I met face to face with patient and family in  home. Palliative Care was asked to follow this patient by consultation request of  Feldpausch, Cheryl BRAVO, MD to address advance care planning and complex medical decision making. This is the initial visit.                                     ASSESSMENT AND PLAN / RECOMMENDATIONS:   Advance Care Planning/Goals of Care: Goals include to maximize quality of life and symptom management. Patient/health care surrogate gave his/her permission to discuss.Our advance care planning conversation included a discussion about:    The value and importance of advance care planning  Experiences with loved ones who have been seriously ill or have died - wife was on hospice services. Patient performed life review Exploration of personal, cultural or spiritual beliefs that might influence medical decisions  Exploration of goals of care in the event of a sudden injury or illness  Identification of a healthcare agent - susan, daughter Review aof an  advance directive document . Pt and family state they have living will. We went over MOST questions, all items explained and they would like to consider. They may f/u with Pcp if they see her before my next visit. CODE STATUS: FULL  I spent 20  minutes providing this consultation. More than 50% of the time in this consultation was spent in counseling and care coordination. --------------------------------------------------------------------------------------------------------  Symptom Management/Plan:  Pain: Endorses pain in shoulder, on 100 gabapentin bid. Discussed  neuropathic pain. Recommended acetaminophen  CR 650 mg tid. Cannot take opioids.Daughter to get Acetaminophen  to try atc medication. I also suggested looking into acupuncture perhaps at chiropractor if reimbursed.  Mobility; Uses walker, has lift chair. Rises with some difficulty and pain. Shoulder pain impacts his use of walker. Pain seems too great to benefit from any exercise, although that may be a goal to achieve with pain management.  Self Care: Is alone at home, may benefit from care giver checking on him. Daughter states she will move in after her husband retires later this year.  Mood: Not on any mood elevators. Daughter feels this would help him, and he is in agreementI will prescribe zoloft  25 mg x 2 weeks, then 50 mg daily po. Will send to [facility] on [address].  Sensory deprivation: Endorses hearing loss. Has hearing aids that are not agreeable. I encouraged him to go back to supplier and work with getting them more useful to him.   Eye tic: Has L eye tic, red and tearing. Discussed botox or seeking consult to see about a centrally acting medicaion.  Follow up Palliative Care Visit: Palliative care will continue to follow for complex medical decision making, advance  care planning, and clarification of goals. Return 6 weeks or prn.  This visit was coded based on medical decision making (MDM).  PPS: 50%  HOSPICE ELIGIBILITY/DIAGNOSIS: TBD  Chief Complaint: pain, immobility  HISTORY OF PRESENT ILLNESS:  Carlos [name] is a 88 y.o. year old male  with DM, CKD 4, CHF, immobility, OA . Patient seen today to review palliative care needs to include medical  decision making and advance care planning as appropriate.   History obtained from review of EMR, discussion with primary team, and interview with family, facility staff/caregiver and/or Carlos [name].  I reviewed available labs, medications, imaging, studies and related documents from the EMR.  Records reviewed and summarized above.   ROS  General: NAD EYES: denies vision changes, endorses L eye tic ENMT: denies dysphagia Cardiovascular: denies chest pain, denies DOE Pulmonary: denies cough, denies increased SOB Abdomen: endorses good appetite, denies constipation, endorses continence of bowel GU: denies dysuria, endorses continence of urine MSK:  denies increased weakness,  no falls reported Skin: denies rashes or wounds Neurological:  endorses  pain, denies insomnia Psych: Endorses depressed mood Heme/lymph/immuno: denies bruises, abnormal bleeding  Physical Exam: Current and past weights:207 lbs reported Constitutional: NAD General: frail appearing, WNWD EYES: anicteric sclera,  no discharge , L eye tic with tearing ENMT: hard of hearing, oral mucous membranes moist, dentition intact CV: S1S2, RRR, 2+ bil  LE edema Pulmonary: LCTA, no increased work of breathing, no cough, room air Abdomen: intake 100%, normo-active BS + 4 quadrants, soft and non tender, no ascites GU: deferred MSK: mod  sarcopenia, moves all extremities, ambulatory with walker  Skin: warm and dry, no rashes or wounds on visible skin Neuro:  + generalized weakness,  no cognitive impairment (impacted by hearing loss ) Psych: non-anxious affect, A and O x 3 Hem/lymph/immuno: no widespread bruising CURRENT PROBLEM LIST:  Patient Active Problem List   Diagnosis Date Noted   Bilateral rotator cuff dysfunction 03/17/2021   History of rotator cuff surgery 03/17/2021   Lumbar facet arthropathy 03/17/2021   Lumbar degenerative disc disease 03/17/2021   Chronic pain syndrome 03/17/2021   Anemia of chronic disease  09/10/2019   Edema of both lower legs due to peripheral venous insufficiency 09/10/2019   Edema of both lower extremities due to peripheral venous insufficiency 09/10/2019   Acute on chronic combined systolic and diastolic CHF (congestive heart failure) (HCC) 05/23/2019   Acute pain of left shoulder 09/08/2015   CKD (chronic kidney disease), stage IV (HCC) 12/24/2014   Bradycardia 12/02/2014   TI (tricuspid incompetence) 10/21/2014   DD (diverticular disease) 07/30/2014   Essential (primary) hypertension 07/30/2014   Tuberculosis 07/30/2014   Hyperlipidemia 07/30/2014   Chronic systolic CHF (congestive heart failure), NYHA class 2 (HCC) 02/12/2014   AF (paroxysmal atrial fibrillation) (HCC) 02/12/2014   Paroxysmal A-fib (HCC) 02/12/2014   Accumulation of fluid in tissues 07/24/2013   Acid reflux 07/24/2013   Gout 07/24/2013   Diabetes mellitus, type 2 (HCC) 07/24/2013   Type 2 diabetes mellitus with renal manifestations, controlled (HCC) 07/24/2013   Peripheral blood vessel disorder (HCC) 07/19/2013   PVD (peripheral vascular disease) (HCC) 07/19/2013   Combined fat and carbohydrate induced hyperlipemia 05/21/2013   Encounter for therapeutic drug monitoring 12/18/2012   Chronic kidney disease (CKD), stage IV (severe) (HCC) 12/18/2012   Arteriosclerosis of coronary artery 12/18/2012   Decreased potassium in the blood 12/18/2012   H/O coronary artery bypass surgery 12/18/2012   H/O total knee replacement 12/18/2012   Encounter  for therapeutic drug level monitoring 12/18/2012   PAST MEDICAL HISTORY:  Active Ambulatory Problems    Diagnosis Date Noted   Encounter for therapeutic drug monitoring 12/18/2012   Chronic systolic CHF (congestive heart failure), NYHA class 2 (HCC) 02/12/2014   Chronic kidney disease (CKD), stage IV (severe) (HCC) 12/18/2012   DD (diverticular disease) 07/30/2014   Accumulation of fluid in tissues 07/24/2013   Essential (primary) hypertension 07/30/2014    Acid reflux 07/24/2013   Gout 07/24/2013   Arteriosclerosis of coronary artery 12/18/2012   Decreased potassium in the blood 12/18/2012   Combined fat and carbohydrate induced hyperlipemia 05/21/2013   AF (paroxysmal atrial fibrillation) (HCC) 02/12/2014   Peripheral blood vessel disorder (HCC) 07/19/2013   H/O coronary artery bypass surgery 12/18/2012   H/O total knee replacement 12/18/2012   Diabetes mellitus, type 2 (HCC) 07/24/2013   Tuberculosis 07/30/2014   Hyperlipidemia 07/30/2014   Encounter for therapeutic drug level monitoring 12/18/2012   TI (tricuspid incompetence) 10/21/2014   Bradycardia 12/02/2014   CKD (chronic kidney disease), stage IV (HCC) 12/24/2014   Paroxysmal A-fib (HCC) 02/12/2014   PVD (peripheral vascular disease) (HCC) 07/19/2013   Type 2 diabetes mellitus with renal manifestations, controlled (HCC) 07/24/2013   Acute pain of left shoulder 09/08/2015   Acute on chronic combined systolic and diastolic CHF (congestive heart failure) (HCC) 05/23/2019   Anemia of chronic disease 09/10/2019   Edema of both lower legs due to peripheral venous insufficiency 09/10/2019   Edema of both lower extremities due to peripheral venous insufficiency 09/10/2019   Bilateral rotator cuff dysfunction 03/17/2021   History of rotator cuff surgery 03/17/2021   Lumbar facet arthropathy 03/17/2021   Lumbar degenerative disc disease 03/17/2021   Chronic pain syndrome 03/17/2021   Resolved Ambulatory Problems    Diagnosis Date Noted   No Resolved Ambulatory Problems   Past Medical History:  Diagnosis Date   Arthritis    Atrial fibrillation (HCC)    CHF (congestive heart failure) (HCC)    Chronic kidney disease    Coronary artery disease    Diabetes mellitus without complication (HCC)    Diverticulosis    GERD (gastroesophageal reflux disease)    Hypertension    Myocardial infarction (HCC) 2001   Peripheral vascular disease (HCC)    Pneumothorax, left 1950   SOCIAL HX:   Social History   Tobacco Use   Smoking status: Former    Types: Pipe    Quit date: 07/30/2006    Years since quitting: 14.8   Smokeless tobacco: Never  Substance Use Topics   Alcohol use: Yes    Alcohol/week: 14.0 standard drinks    Types: 14 Glasses of wine per week    Comment: one glass of wine daily   FAMILY HX:  Family History  Problem Relation Age of Onset   Lung cancer Mother    Tuberculosis Brother       ALLERGIES: No Active Allergies   PERTINENT MEDICATIONS:  Outpatient Encounter Medications as of 06/17/2021  Medication Sig   allopurinol  (ZYLOPRIM ) 300 MG tablet Take 150 mg by mouth daily.    apixaban  (ELIQUIS ) 2.5 MG TABS tablet Take by mouth 2 (two) times daily.   aspirin  EC 81 MG tablet Take 81 mg by mouth at bedtime.  (Patient not taking: Reported on 03/17/2021)   ferrous sulfate  325 (65 FE) MG tablet Take 1 tablet (325 mg total) by mouth daily.   finasteride  (PROSCAR ) 5 MG tablet Take 1 tablet (5 mg total) by mouth  daily.   gabapentin (NEURONTIN) 100 MG capsule Take 100 mg by mouth 3 (three) times daily.   loratadine  (CLARITIN ) 10 MG tablet Take 10 mg by mouth daily as needed.   losartan  (COZAAR ) 25 MG tablet Take 25 mg by mouth daily. (Patient not taking: Reported on 03/29/2021)   metoprolol  succinate (TOPROL -XL) 25 MG 24 hr tablet Take 25 mg by mouth daily.   Multiple Vitamin (MULTI-VITAMINS) TABS Take 1 tablet by mouth daily.    Omega-3 Fatty Acids (FISH OIL) 1000 MG CAPS Take 1 capsule by mouth daily. (Patient not taking: Reported on 03/17/2021)   pantoprazole  (PROTONIX ) 40 MG tablet Take 40 mg by mouth daily.   simvastatin  (ZOCOR ) 40 MG tablet Take 40 mg by mouth daily at 6 PM.    tamsulosin  (FLOMAX ) 0.4 MG CAPS capsule Take 1 capsule (0.4 mg total) by mouth daily.   torsemide  (DEMADEX ) 20 MG tablet Take 2 tablets (40 mg total) by mouth daily.   traMADol  (ULTRAM ) 50 MG tablet Take 1 tablet (50 mg total) by mouth every 6 (six) hours as needed. Prn pain (Patient  not taking: Reported on 03/17/2021)   [DISCONTINUED] colchicine 0.6 MG tablet Take 0.6 mg by mouth as needed.    No facility-administered encounter medications on file as of 06/17/2021.   Thank you for the opportunity to participate in the care of Carlos [name].  The palliative care team will continue to follow. Please call our office at [phone] if we can be of additional assistance.   Carlos Welby Sharps, NP , DNP, AGPCNP-BC  COVID-19 PATIENT SCREENING TOOL Asked and negative response unless otherwise noted:  Have you had symptoms of covid, tested positive or been in contact with someone with symptoms/positive test in the past 5-10 days?

## 2021-08-02 ENCOUNTER — Other Ambulatory Visit: Payer: Medicare Other | Admitting: Primary Care

## 2021-08-02 DIAGNOSIS — G894 Chronic pain syndrome: Secondary | ICD-10-CM

## 2021-08-02 DIAGNOSIS — I872 Venous insufficiency (chronic) (peripheral): Secondary | ICD-10-CM

## 2021-08-02 DIAGNOSIS — M51369 Other intervertebral disc degeneration, lumbar region without mention of lumbar back pain or lower extremity pain: Secondary | ICD-10-CM

## 2021-08-02 DIAGNOSIS — Z515 Encounter for palliative care: Secondary | ICD-10-CM

## 2021-08-02 DIAGNOSIS — R600 Localized edema: Secondary | ICD-10-CM

## 2021-08-02 DIAGNOSIS — M5136 Other intervertebral disc degeneration, lumbar region: Secondary | ICD-10-CM

## 2021-08-02 NOTE — Progress Notes (Signed)
Designer, jewellery Palliative Care Consult Note Telephone: [phone]  Fax: [phone]    Date of encounter: 08/02/21 11:32 AM PATIENT NAME: Carlos [name] [address]   [phone] (home) [phone] (work) DOB: 1926/06/12 MRN: [id number] PRIMARY CARE PROVIDER:    [name], MD,  [place] [address] [phone]  REFERRING PROVIDER:   Sofie [name], [address] [place],  [place] [address] [phone]  RESPONSIBLE PARTY:    Contact Information     Name Relation Home Work [place] Daughter [phone]  [phone]   Carlos [name] [phone]         I met face to face with patient  in  home. Palliative Care was asked to follow this patient by consultation request of  [name], Carlos Noa, MD to address advance care planning and complex medical decision making. This is a follow up visit.                                   ASSESSMENT AND PLAN / RECOMMENDATIONS:   Advance Care Planning/Goals of Care: Goals include to maximize quality of life and symptom management. Patient/health care surrogate gave his/her permission to discuss.Our advance care planning conversation included a discussion about:     Experiences with loved ones who have been seriously ill or have died  Exploration of personal, cultural or spiritual beliefs that might influence medical decisions  Exploration of goals of care in the event of a sudden injury or illness  Identification of a healthcare agent- Carlos [name] CODE STATUS: FULL Patient does life review, many peers are dead. States he feels well for his age.  Symptom Management/Plan:   I met with patient in his home. He stays by himself most of the time, and his daughter checks on him periodically from her home in [place] .  Pain he outlines chronic pain, especially in his shoulders. He states he has some follow up with pain management and  often sleeps in his chair. I would defer management to the pain specialty clinics. He would likely benefit from some amount of purposeful movement however, such as at home exercise program.   Mood appears good today; he does reminisce a lot about his life in [place], and working in [place] throughout his career. His wife has passed on, as have his siblings and their spouses. This appears to be discouraging for him.   Mobility. Patient uses a Rollator and has a lift chair; does not report any recent falls.  Sinus /respiratory appears not to have drainage or difficulty with sinuses/URI. Was seen last week for this. He stated issues have  resolved.   Edema: he has bilateral edema although he states the right is more uncomfortable. He also points out some left lower extremity discoloration consistent with peripheral vascular disease. He tells me about a time when he had a tenacious cellulitis in that leg but this appears normal today; no signs or symptoms of infection.   Follow up Palliative Care Visit: Palliative care will continue to follow for complex medical decision making, advance care planning, and clarification of goals. Return 12 weeks or prn.  This visit was coded based on medical decision making (MDM).  PPS: 50%  HOSPICE ELIGIBILITY/DIAGNOSIS: TBD  Chief Complaint: debility, edema, chronic pain  HISTORY OF PRESENT ILLNESS:  Carlos [name] is a 88 y.o. year old male  with CHF,Edema, chronic pain . Patient seen today to review palliative care needs to include medical decision making and advance care planning as appropriate.   History obtained from review of EMR, discussion with primary team, and interview with family, facility staff/caregiver and/or Carlos [name].  I reviewed available labs, medications, imaging, studies and related documents from the EMR.  Records reviewed and summarized above.   ROS  General: NAD ENMT: denies dysphagia Cardiovascular: denies chest pain, denies  DOE Pulmonary: denies cough, denies increased SOB Abdomen: endorses good appetite, denies constipation, endorses continence of bowel GU: denies dysuria, endorses continence of urine MSK:  denies  increased weakness,  no falls reported Skin: denies rashes or wounds Neurological: endorses pain, endorses nsomnia Psych: Endorses positive mood  Physical Exam: Current and past weights: stable  Constitutional: NAD General: frail appearing EYES: anicteric sclera, lids intact, no discharge  ENMT: intact hearing, oral mucous membranes moist, dentition intact CV: 1-2+  LE edema Pulmonary: no increased work of breathing, no cough, room air Abdomen: intake 100%,  no ascites MSK: mod sarcopenia, moves all extremities, ambulatory with walker  Skin: warm and dry, no rashes or wounds on visible skin Neuro:  no new generalized weakness,  no cognitive impairment, non-anxious affect   Thank you for the opportunity to participate in the care of Carlos [name].  The palliative care team will continue to follow. Please call our office at [phone] if we can be of additional assistance.   Carlos Coop, NP DNP, AGPCNP-BC  COVID-19 PATIENT SCREENING TOOL Asked and negative response unless otherwise noted:   Have you had symptoms of covid, tested positive or been in contact with someone with symptoms/positive test in the past 5-10 days?

## 2021-09-14 ENCOUNTER — Ambulatory Visit: Payer: Medicare Other | Admitting: Student in an Organized Health Care Education/Training Program

## 2021-09-14 ENCOUNTER — Inpatient Hospital Stay
Admission: EM | Admit: 2021-09-14 | Discharge: 2021-09-17 | DRG: 312 | Disposition: A | Discharge status: Home Health | Payer: Medicare Other | Attending: Internal Medicine | Admitting: Internal Medicine

## 2021-09-14 ENCOUNTER — Other Ambulatory Visit: Payer: Self-pay

## 2021-09-14 ENCOUNTER — Emergency Department: Payer: Medicare Other

## 2021-09-14 ENCOUNTER — Encounter: Payer: Self-pay | Admitting: Student in an Organized Health Care Education/Training Program

## 2021-09-14 VITALS — BP 65/48 | HR 62 | Temp 97.0°F | Ht 67.0 in | Wt 202.0 lb

## 2021-09-14 DIAGNOSIS — E669 Obesity, unspecified: Secondary | ICD-10-CM | POA: Diagnosis present

## 2021-09-14 DIAGNOSIS — R531 Weakness: Secondary | ICD-10-CM | POA: Insufficient documentation

## 2021-09-14 DIAGNOSIS — Y92009 Unspecified place in unspecified non-institutional (private) residence as the place of occurrence of the external cause: Secondary | ICD-10-CM

## 2021-09-14 DIAGNOSIS — E1140 Type 2 diabetes mellitus with diabetic neuropathy, unspecified: Secondary | ICD-10-CM | POA: Diagnosis present

## 2021-09-14 DIAGNOSIS — G894 Chronic pain syndrome: Secondary | ICD-10-CM | POA: Diagnosis present

## 2021-09-14 DIAGNOSIS — Z20822 Contact with and (suspected) exposure to covid-19: Secondary | ICD-10-CM | POA: Diagnosis present

## 2021-09-14 DIAGNOSIS — Z87891 Personal history of nicotine dependence: Secondary | ICD-10-CM

## 2021-09-14 DIAGNOSIS — T169XXA Foreign body in ear, unspecified ear, initial encounter: Secondary | ICD-10-CM | POA: Diagnosis present

## 2021-09-14 DIAGNOSIS — T502X5A Adverse effect of carbonic-anhydrase inhibitors, benzothiadiazides and other diuretics, initial encounter: Secondary | ICD-10-CM | POA: Diagnosis present

## 2021-09-14 DIAGNOSIS — R55 Syncope and collapse: Principal | ICD-10-CM

## 2021-09-14 DIAGNOSIS — I951 Orthostatic hypotension: Principal | ICD-10-CM | POA: Diagnosis present

## 2021-09-14 DIAGNOSIS — Z951 Presence of aortocoronary bypass graft: Secondary | ICD-10-CM

## 2021-09-14 DIAGNOSIS — I480 Paroxysmal atrial fibrillation: Secondary | ICD-10-CM | POA: Diagnosis present

## 2021-09-14 DIAGNOSIS — Z87442 Personal history of urinary calculi: Secondary | ICD-10-CM

## 2021-09-14 DIAGNOSIS — Z79899 Other long term (current) drug therapy: Secondary | ICD-10-CM

## 2021-09-14 DIAGNOSIS — Z801 Family history of malignant neoplasm of trachea, bronchus and lung: Secondary | ICD-10-CM

## 2021-09-14 DIAGNOSIS — I129 Hypertensive chronic kidney disease with stage 1 through stage 4 chronic kidney disease, or unspecified chronic kidney disease: Secondary | ICD-10-CM | POA: Diagnosis present

## 2021-09-14 DIAGNOSIS — E119 Type 2 diabetes mellitus without complications: Secondary | ICD-10-CM

## 2021-09-14 DIAGNOSIS — I959 Hypotension, unspecified: Secondary | ICD-10-CM

## 2021-09-14 DIAGNOSIS — N184 Chronic kidney disease, stage 4 (severe): Secondary | ICD-10-CM | POA: Diagnosis present

## 2021-09-14 DIAGNOSIS — E785 Hyperlipidemia, unspecified: Secondary | ICD-10-CM | POA: Diagnosis present

## 2021-09-14 DIAGNOSIS — M25512 Pain in left shoulder: Secondary | ICD-10-CM | POA: Diagnosis present

## 2021-09-14 DIAGNOSIS — Z6830 Body mass index (BMI) 30.0-30.9, adult: Secondary | ICD-10-CM

## 2021-09-14 DIAGNOSIS — T465X5A Adverse effect of other antihypertensive drugs, initial encounter: Secondary | ICD-10-CM | POA: Diagnosis present

## 2021-09-14 DIAGNOSIS — Z7901 Long term (current) use of anticoagulants: Secondary | ICD-10-CM

## 2021-09-14 DIAGNOSIS — R197 Diarrhea, unspecified: Secondary | ICD-10-CM | POA: Diagnosis present

## 2021-09-14 DIAGNOSIS — I2510 Atherosclerotic heart disease of native coronary artery without angina pectoris: Secondary | ICD-10-CM | POA: Diagnosis present

## 2021-09-14 DIAGNOSIS — Z885 Allergy status to narcotic agent status: Secondary | ICD-10-CM

## 2021-09-14 DIAGNOSIS — E1122 Type 2 diabetes mellitus with diabetic chronic kidney disease: Secondary | ICD-10-CM | POA: Diagnosis present

## 2021-09-14 DIAGNOSIS — Z888 Allergy status to other drugs, medicaments and biological substances status: Secondary | ICD-10-CM

## 2021-09-14 DIAGNOSIS — M109 Gout, unspecified: Secondary | ICD-10-CM | POA: Diagnosis present

## 2021-09-14 DIAGNOSIS — S00459A Superficial foreign body of unspecified ear, initial encounter: Secondary | ICD-10-CM | POA: Diagnosis present

## 2021-09-14 DIAGNOSIS — K219 Gastro-esophageal reflux disease without esophagitis: Secondary | ICD-10-CM | POA: Diagnosis present

## 2021-09-14 DIAGNOSIS — M25511 Pain in right shoulder: Secondary | ICD-10-CM | POA: Diagnosis present

## 2021-09-14 DIAGNOSIS — Z96651 Presence of right artificial knee joint: Secondary | ICD-10-CM | POA: Diagnosis present

## 2021-09-14 DIAGNOSIS — Z831 Family history of other infectious and parasitic diseases: Secondary | ICD-10-CM

## 2021-09-14 DIAGNOSIS — Z9049 Acquired absence of other specified parts of digestive tract: Secondary | ICD-10-CM

## 2021-09-14 DIAGNOSIS — I10 Essential (primary) hypertension: Secondary | ICD-10-CM | POA: Diagnosis present

## 2021-09-14 DIAGNOSIS — Z7982 Long term (current) use of aspirin: Secondary | ICD-10-CM

## 2021-09-14 DIAGNOSIS — M542 Cervicalgia: Secondary | ICD-10-CM | POA: Insufficient documentation

## 2021-09-14 DIAGNOSIS — E861 Hypovolemia: Secondary | ICD-10-CM | POA: Diagnosis present

## 2021-09-14 DIAGNOSIS — I252 Old myocardial infarction: Secondary | ICD-10-CM

## 2021-09-14 LAB — COMPREHENSIVE METABOLIC PANEL
ALT: 15 U/L (ref 0–44)
AST: 24 U/L (ref 15–41)
Albumin: 3.4 g/dL — ABNORMAL LOW (ref 3.5–5.0)
Alkaline Phosphatase: 63 U/L (ref 38–126)
Anion gap: 6 (ref 5–15)
BUN: 45 mg/dL — ABNORMAL HIGH (ref 8–23)
CO2: 29 mmol/L (ref 22–32)
Calcium: 9.4 mg/dL (ref 8.9–10.3)
Chloride: 108 mmol/L (ref 98–111)
Creatinine, Ser: 2.78 mg/dL — ABNORMAL HIGH (ref 0.61–1.24)
GFR, Estimated: 21 mL/min — ABNORMAL LOW (ref >60)
Glucose, Bld: 113 mg/dL — ABNORMAL HIGH (ref 70–99)
Potassium: 4.3 mmol/L (ref 3.5–5.1)
Sodium: 143 mmol/L (ref 135–145)
Total Bilirubin: 0.7 mg/dL (ref 0.3–1.2)
Total Protein: 6.3 g/dL — ABNORMAL LOW (ref 6.5–8.1)

## 2021-09-14 LAB — CBC WITH DIFFERENTIAL/PLATELET
Abs Immature Granulocytes: 0.01 10*3/uL (ref 0.00–0.07)
Basophils Absolute: 0 10*3/uL (ref 0.0–0.1)
Basophils Relative: 1 %
Eosinophils Absolute: 0.4 10*3/uL (ref 0.0–0.5)
Eosinophils Relative: 7 %
HCT: 32.1 % — ABNORMAL LOW (ref 39.0–52.0)
Hemoglobin: 10 g/dL — ABNORMAL LOW (ref 13.0–17.0)
Immature Granulocytes: 0 %
Lymphocytes Relative: 22 %
Lymphs Abs: 1.4 10*3/uL (ref 0.7–4.0)
MCH: 31.6 pg (ref 26.0–34.0)
MCHC: 31.2 g/dL (ref 30.0–36.0)
MCV: 101.6 fL — ABNORMAL HIGH (ref 80.0–100.0)
Monocytes Absolute: 0.8 10*3/uL (ref 0.1–1.0)
Monocytes Relative: 12 %
Neutro Abs: 3.7 10*3/uL (ref 1.7–7.7)
Neutrophils Relative %: 58 %
Platelets: 183 10*3/uL (ref 150–400)
RBC: 3.16 MIL/uL — ABNORMAL LOW (ref 4.22–5.81)
RDW: 14.3 % (ref 11.5–15.5)
WBC: 6.4 10*3/uL (ref 4.0–10.5)
nRBC: 0 % (ref 0.0–0.2)

## 2021-09-14 LAB — CBC
HCT: 29.1 % — ABNORMAL LOW (ref 39.0–52.0)
Hemoglobin: 9.1 g/dL — ABNORMAL LOW (ref 13.0–17.0)
MCH: 31.4 pg (ref 26.0–34.0)
MCHC: 31.3 g/dL (ref 30.0–36.0)
MCV: 100.3 fL — ABNORMAL HIGH (ref 80.0–100.0)
Platelets: 178 10*3/uL (ref 150–400)
RBC: 2.9 MIL/uL — ABNORMAL LOW (ref 4.22–5.81)
RDW: 14.3 % (ref 11.5–15.5)
WBC: 5.8 10*3/uL (ref 4.0–10.5)
nRBC: 0 % (ref 0.0–0.2)

## 2021-09-14 LAB — PROTIME-INR
INR: 1.2 (ref 0.8–1.2)
Prothrombin Time: 15.2 seconds (ref 11.4–15.2)

## 2021-09-14 LAB — TROPONIN I (HIGH SENSITIVITY)
Troponin I (High Sensitivity): 18 ng/L — ABNORMAL HIGH (ref <18)
Troponin I (High Sensitivity): 16 ng/L (ref <18)

## 2021-09-14 LAB — LACTIC ACID, PLASMA
Lactic Acid, Venous: 1.4 mmol/L (ref 0.5–1.9)
Lactic Acid, Venous: 1 mmol/L (ref 0.5–1.9)

## 2021-09-14 LAB — SARS CORONAVIRUS 2 BY RT PCR: SARS Coronavirus 2 by RT PCR: NEGATIVE

## 2021-09-14 LAB — PROCALCITONIN: Procalcitonin: <0.1 ng/mL

## 2021-09-14 LAB — TSH: TSH: 2.532 u[IU]/mL (ref 0.350–4.500)

## 2021-09-14 LAB — GLUCOSE, CAPILLARY: Glucose-Capillary: 114 mg/dL — ABNORMAL HIGH (ref 70–99)

## 2021-09-14 MED ORDER — ADULT MULTIVITAMIN W/MINERALS CH
1.0000 | ORAL_TABLET | Freq: Every day | ORAL | Status: DC
  Administered 2021-09-15 – 2021-09-17 (×3): 1 via ORAL
  Filled 2021-09-14 (×3): qty 1

## 2021-09-14 MED ORDER — FINASTERIDE 5 MG PO TABS
5.0000 mg | ORAL_TABLET | Freq: Every day | ORAL | Status: DC
  Administered 2021-09-15 – 2021-09-17 (×3): 5 mg via ORAL
  Filled 2021-09-14 (×3): qty 1

## 2021-09-14 MED ORDER — LACTATED RINGERS IV BOLUS
1000.0000 mL | Freq: Once | INTRAVENOUS | Status: AC
  Administered 2021-09-14: 1000 mL via INTRAVENOUS

## 2021-09-14 MED ORDER — HEPARIN SODIUM (PORCINE) 5000 UNIT/ML IJ SOLN: 5000.0000 [IU] | Freq: Three times a day (TID) | INTRAMUSCULAR | Status: DC

## 2021-09-14 MED ORDER — MELATONIN 5 MG PO TABS: 5.0000 mg | ORAL_TABLET | Freq: Every evening | ORAL | Status: DC | PRN

## 2021-09-14 MED ORDER — SENNOSIDES-DOCUSATE SODIUM 8.6-50 MG PO TABS: 1.0000 | ORAL_TABLET | Freq: Every evening | ORAL | Status: DC | PRN

## 2021-09-14 MED ORDER — PANTOPRAZOLE SODIUM 40 MG PO TBEC
40.0000 mg | DELAYED_RELEASE_TABLET | Freq: Every day | ORAL | Status: DC
  Administered 2021-09-15 – 2021-09-17 (×3): 40 mg via ORAL
  Filled 2021-09-14 (×3): qty 1

## 2021-09-14 MED ORDER — SODIUM CHLORIDE 0.9 % IV BOLUS
250.0000 mL | Freq: Once | INTRAVENOUS | Status: AC
  Administered 2021-09-14: 250 mL via INTRAVENOUS

## 2021-09-14 MED ORDER — SODIUM CHLORIDE 0.9% FLUSH
3.0000 mL | Freq: Two times a day (BID) | INTRAVENOUS | Status: DC
  Administered 2021-09-14 – 2021-09-16 (×6): 3 mL via INTRAVENOUS

## 2021-09-14 MED ORDER — SIMVASTATIN 20 MG PO TABS
40.0000 mg | ORAL_TABLET | Freq: Every day | ORAL | Status: DC
  Administered 2021-09-15 – 2021-09-16 (×2): 40 mg via ORAL
  Filled 2021-09-14 (×2): qty 2

## 2021-09-14 MED ORDER — ONDANSETRON HCL 4 MG PO TABS: 4.0000 mg | ORAL_TABLET | Freq: Four times a day (QID) | ORAL | Status: DC | PRN

## 2021-09-14 MED ORDER — ONDANSETRON HCL 4 MG/2ML IJ SOLN: 4.0000 mg | Freq: Four times a day (QID) | INTRAMUSCULAR | Status: DC | PRN

## 2021-09-14 MED ORDER — ACETAMINOPHEN 650 MG RE SUPP: 650.0000 mg | Freq: Four times a day (QID) | RECTAL | Status: DC | PRN

## 2021-09-14 MED ORDER — APIXABAN 2.5 MG PO TABS
2.5000 mg | ORAL_TABLET | Freq: Two times a day (BID) | ORAL | Status: DC
  Administered 2021-09-14 – 2021-09-17 (×6): 2.5 mg via ORAL
  Filled 2021-09-14 (×6): qty 1

## 2021-09-14 MED ORDER — SERTRALINE HCL 50 MG PO TABS
50.0000 mg | ORAL_TABLET | Freq: Every day | ORAL | Status: DC
  Administered 2021-09-15 – 2021-09-17 (×3): 50 mg via ORAL
  Filled 2021-09-14 (×3): qty 1

## 2021-09-14 MED ORDER — POLYETHYLENE GLYCOL 3350 17 G PO PACK: 17.0000 g | PACK | Freq: Every day | ORAL | Status: DC | PRN

## 2021-09-14 MED ORDER — ACETAMINOPHEN 325 MG PO TABS: 650.0000 mg | ORAL_TABLET | Freq: Four times a day (QID) | ORAL | Status: DC | PRN

## 2021-09-14 MED ORDER — HYDRALAZINE HCL 10 MG PO TABS: 10.0000 mg | ORAL_TABLET | Freq: Four times a day (QID) | ORAL | Status: AC | PRN

## 2021-09-14 MED ORDER — ALLOPURINOL 100 MG PO TABS
150.0000 mg | ORAL_TABLET | Freq: Every day | ORAL | Status: DC
  Administered 2021-09-15 – 2021-09-17 (×3): 150 mg via ORAL
  Filled 2021-09-14: qty 2
  Filled 2021-09-14: qty 0.5
  Filled 2021-09-14: qty 2

## 2021-09-14 NOTE — ED Triage Notes (Signed)
Pt to ED POV with daughter, came from pain clinic where he had suddenly become unresponsive and developed a severe HA. Pt has CKD, does not take narcotics, only tylenol and was being seen for eval for ablation procedure for bone on bone pain in shoulders.  Daughter states pt did not pass out, but had episode of about 5 minutes where was not responding normally, not following commands. Pt was only complaining of HA. BP was low, 65/48. CBG was 114. Pt is somnolent. BP is 67/33, MAP of 45. Informed first nurse for room.

## 2021-09-14 NOTE — Assessment & Plan Note (Addendum)
-   Continued on home regimen simvastatin.

## 2021-09-14 NOTE — ED Notes (Signed)
Edp came into room as soon as pt was roomed. Other Rns in room drawing labs etc.

## 2021-09-14 NOTE — Assessment & Plan Note (Addendum)
-   MRI of the brain could not be ordered

## 2021-09-14 NOTE — Progress Notes (Signed)
[place] patient to room.  Color was pale.  Daughter at bedside.  Patient could not answer questions or follow commands.  Daughter states something is wrong.  Patient c/o severe headache.  BP 65/48, oxygen 94%, Blood sugar 114.  Attempted to lay patinet in the bed but he could not move any extremities.  Dr Holley Raring notified at [phone].  Patient transferred to ED via wheelchair.  When arrived to ED, patient was able to move all extremities and communicate.  States that his headache is gone.  Report given to ED front desk.  Daughter at bedside.

## 2021-09-14 NOTE — ED Provider Notes (Signed)
[facility] Provider Note    Event Date/Time   First MD Initiated Contact with Patient 09/14/21 1430     (approximate)   History   Chief Complaint unresponsive and Headache   HPI  Carlos [name] is a 88 y.o. male with past medical history of hypertension, diabetes, atrial fibrillation on Eliquis, CHF, CKD, CAD, and chronic pain syndrome who presents to the ED following episode of unresponsiveness.  Return if history is obtained from patient's daughter, who states that she had brought him to his regularly scheduled appointment at pain management earlier today.  She states that he was doing well on the way to the appointment and did not have any complaints.  Shortly after arriving, patient became minimally responsive and seemed to pass out.  When he woke up, he complained of diffuse headache, continues to complain of dull achy pain across much of his head.  Daughter states that he was not complaining of headache prior to this episode and he denies any chest pain or shortness of breath with the episode.  He does not take any narcotic pain medication, daughter states he takes only Tylenol for his chronic shoulder pain.  Patient found to be hypotensive with BP of 65/48 at the clinic.     Physical Exam   Triage Vital Signs: ED Triage Vitals  Enc Vitals Group     BP 09/14/21 1424 (!) 67/33     Pulse Rate 09/14/21 1424 (!) 59     Resp 09/14/21 1424 20     Temp 09/14/21 1424 97.6 F (36.4 C)     Temp Source 09/14/21 1424 Oral     SpO2 09/14/21 1424 93 %     Weight 09/14/21 1431 200 lb 9.9 oz (91 kg)     Height 09/14/21 1431 5\' 7"  (1.702 m)     Head Circumference --      Peak Flow --      Pain Score 09/14/21 1431 9     Pain Loc --      Pain Edu? --      Excl. in [place]? --     Most recent vital signs: Vitals:   09/14/21 1437 09/14/21 1524  BP:  126/60  Pulse:  63  Resp: 19 20  Temp:    SpO2:  100%    Constitutional: Awake and alert, oriented to person,  place, time, and situation. Eyes: Conjunctivae are normal. Head: Atraumatic. Nose: No congestion/rhinnorhea. Mouth/Throat: Mucous membranes are moist.  Neck: No midline cervical spine tenderness to palpation. Cardiovascular: Normal rate, regular rhythm. Grossly normal heart sounds.  2+ radial pulses bilaterally. Respiratory: Normal respiratory effort.  No retractions. Lungs CTAB. Gastrointestinal: Soft and nontender. No distention. Musculoskeletal: No lower extremity tenderness nor edema.  Neurologic:  Normal speech and language. No gross focal neurologic deficits are appreciated.    ED Results / Procedures / Treatments   Labs (all labs ordered are listed, but only abnormal results are displayed) Labs Reviewed  COMPREHENSIVE METABOLIC PANEL - Abnormal; Notable for the following components:      Result Value   Glucose, Bld 113 (*)    BUN 45 (*)    Creatinine, Ser 2.78 (*)    Total Protein 6.3 (*)    Albumin 3.4 (*)    GFR, Estimated 21 (*)    All other components within normal limits  CBC WITH DIFFERENTIAL/PLATELET - Abnormal; Notable for the following components:   RBC 3.16 (*)    Hemoglobin 10.0 (*)  HCT 32.1 (*)    MCV 101.6 (*)    All other components within normal limits  TROPONIN I (HIGH SENSITIVITY) - Abnormal; Notable for the following components:   Troponin I (High Sensitivity) 18 (*)    All other components within normal limits  CULTURE, BLOOD (ROUTINE X 2)  CULTURE, BLOOD (ROUTINE X 2)  LACTIC ACID, PLASMA  PROTIME-INR  LACTIC ACID, PLASMA  URINALYSIS, ROUTINE W REFLEX MICROSCOPIC  CBC  TSH     EKG  ED ECG REPORT I, [name], the attending physician, personally viewed and interpreted this ECG.   Date: 09/14/2021  EKG Time: 14:37  Rate: 58  Rhythm: normal sinus rhythm vs junctional rhythm  Axis: LAD  Intervals:none  ST&T Change: None  RADIOLOGY CT head reviewed and interpreted by me with no hemorrhage or midline shift.  Chest x-ray  reviewed and interpreted by me with no infiltrate, edema, or effusion.  PROCEDURES:  Critical Care performed: No  Procedures   MEDICATIONS ORDERED IN ED: Medications  sodium chloride 0.9 % bolus 250 mL (has no administration in time range)  lactated ringers bolus 1,000 mL (has no administration in time range)  simvastatin (ZOCOR) tablet 40 mg (has no administration in time range)  sertraline (ZOLOFT) tablet 50 mg (has no administration in time range)  sodium chloride flush (NS) 0.9 % injection 3 mL (has no administration in time range)  heparin injection 5,000 Units (has no administration in time range)  acetaminophen (TYLENOL) tablet 650 mg (has no administration in time range)    Or  acetaminophen (TYLENOL) suppository 650 mg (has no administration in time range)  senna-docusate (Senokot-S) tablet 1 tablet (has no administration in time range)  ondansetron (ZOFRAN) tablet 4 mg (has no administration in time range)    Or  ondansetron (ZOFRAN) injection 4 mg (has no administration in time range)     IMPRESSION / MDM / ASSESSMENT AND PLAN / ED COURSE  I reviewed the triage vital signs and the nursing notes.                              88 y.o. male with past medical history of hypertension, diabetes, atrial fibrillation on Eliquis, CHF, CKD, CAD, and chronic shoulder pain who presents to the ED complaining of unresponsive episode at his pain management clinic associated with headache.  Patient's presentation is most consistent with acute presentation with potential threat to life or bodily function.  Differential diagnosis includes, but is not limited to, SAH, stroke, TIA, arrhythmia, ACS, electrolyte abnormality, AKI.  Patient initially somnolent with low BP on arrival to the ED, however this improved without intervention and he is awake and alert at the time of my evaluation.  He complains of ongoing headache but has no focal neurologic deficits on exam.  CT head is negative  for acute process, no findings to suggest SAH or stroke at this time.  EKG shows show junctional rhythm versus mild sinus bradycardia, no ischemic changes noted.  Labs show elevation in creatinine compared to his prior baseline with no significant electrolyte abnormality, anemia stable compared to previous with no significant leukocytosis.  Troponin mildly elevated but suspect this is due to his chronic kidney disease, lactic acid within normal limits.  Patient would benefit from further observation given syncopal episode with AKI, will hydrate with IV fluids and case discussed with hospitalist for admission.      FINAL CLINICAL IMPRESSION(S) / ED DIAGNOSES  Final diagnoses:  Syncope, unspecified syncope type  Hypotension, unspecified hypotension type     Rx / DC Orders   ED Discharge Orders     None        Note:  This document was prepared using Dragon voice recognition software and may include unintentional dictation errors.   [name], MD 09/14/21 [phone]

## 2021-09-14 NOTE — Assessment & Plan Note (Addendum)
-   GI pathogen panel, C. difficile PCR negative.   -Discontinued enteric precautions. -Patient was placed on Imodium as needed.

## 2021-09-14 NOTE — Assessment & Plan Note (Addendum)
-   Patient is noted to be on metoprolol tartrate 50 mg p.o. twice daily, Eliquis 2.5 mg p.o. twice daily - Patient noted to be hypotensive on admission, systolic blood pressures in the low 100s on 09/16/2021 and as such Lopressor resumed at a lower dose of 12.5 mg twice daily.  -Patient maintained on home regimen Eliquis 2.5 mg twice daily.  -Outpatient follow-up with primary cardiologist and PCP.

## 2021-09-14 NOTE — H&P (Signed)
History and Physical   Carlos [name] LZJ:[id number] DOB: November 28, 1926 DOA: 09/14/2021  PCP: [name], MD  Outpatient Specialists: Dr. Holley Raring, pain specialist Patient coming from: Pain clinic  I have personally briefly reviewed patient's old medical records in [place].  Chief Concern: Headache, altered mental status  HPI: [name] is a 88 year old male with history of hypertension, chronic pain syndrome, paroxysmal atrial fibrillation on Eliquis, CKD stage IV, hyperlipidemia, GERD, BPH, neuropathy, who presents emergency department for chief concerns of unresponsiveness/syncope while at pain clinic.  Initial vitals in the emergency department showed temperature of 97 and improved to 97.6, respiration rate of 19, heart rate 62, blood pressure 65/48 improved to 126/60 with fluid bolus, SPO2 94% on room air.  Serum sodium is 143, potassium 4.3, chloride 108, bicarb 29, BUN of 45, serum creatinine of 2.18, GFR 18, nonfasting blood glucose 113, WBC 6.5, hemoglobin 10, platelets of 183.  Lactic acid was 1.5.  High sensitive troponin was mildly elevated at 18.  CT of the head without contrast was read as negative for bleed or other acute intracranial process.  Atrophy and nonspecific white matter changes  Portable chest x-ray: Read as no signs of pulmonary edema or focal pulmonary consolidation.  ED treatment: Lactated ringer 1 L bolus, sodium chloride 250 mL one-time dose.  At bedside, he is able to tell me his name, age, current location, and current calendar year.   He states he was seen at his pain management clinic for chronic bilateral shoulder pains. He reports he has gotten shoulder injections for his shoulders. He has been taking tylenol at home for his shoulder pains.  He denies fever, fever, chest pain, abdominal pain, dysuria, shortness of breath.   He reports diarrhea for the last week. He reports the diarrhea is light in color to dark brown and watery.  He  states the diarrhea happens every day and usually in the morning.  He states he has not had his bowel movement yet today.  He denies recent antibiotic use.  He endorses compliance with his home Eliquis medication.  Patient just arrived at Dr. Holley Raring clinic's and they just arrived. He 'zoned' out. He was not responding to questions by the clinic staff. RN asked him to squeeze her hands and he did not do it. He was asked to raise her arms and he was not able to do it at all. He appeared more pale than normal.   He went 'limp', 'motionless'. He did not hit his head. He was sitting at the time. Daughter denies abnormal body movements. She denies urinary and/or bowel incontinence. Daughter states his symptoms improved after about 5 minutes.   Social history: He lives by himself and sometimes his daughter, Carlos [name] stays with him. He denies tobacco, etoh, and recreational drug use. He is retired and formerly worked as a Civil engineer, contracting.   ROS: Constitutional: no weight change, no fever ENT/Mouth: no sore throat, no rhinorrhea Eyes: no eye pain, no vision changes Cardiovascular: no chest pain, no dyspnea,  no edema, no palpitations Respiratory: no cough, no sputum, no wheezing Gastrointestinal: no nausea, no vomiting, + diarrhea, no constipation Genitourinary: no urinary incontinence, no dysuria, no hematuria Musculoskeletal: no arthralgias, no myalgias Skin: no skin lesions, no pruritus, Neuro: + weakness, no loss of consciousness, no syncope Psych: no anxiety, no depression, + decrease appetite Heme/Lymph: no bruising, no bleeding  ED Course: Discussed with emergency medicine provider, patient requiring hospitalization for chief concerns of altered mental status  and near syncope.  Assessment/Plan  Principal Problem:   Pre-syncope Active Problems:   Essential (primary) hypertension   Gout   AF (paroxysmal atrial fibrillation) (HCC)   Diabetes mellitus, type 2 (HCC)   Hyperlipidemia   CKD  (chronic kidney disease), stage IV (HCC)   Paroxysmal A-fib (HCC)   Chronic pain syndrome   GERD (gastroesophageal reflux disease)   Diarrhea   Metal foreign body in ear region   Assessment and Plan:  * Pre-syncope Altered mental status Query secondary to hypovolemia in setting of diarrhea for 1 week - Etiology work-up in progress at this time - Check blood cultures x2, UA, TSH, procalcitonin, EEG, COVID-19 by PCR - Maintain MAP greater than 65 - Admit to telemetry cardiac, observation  Metal foreign body in ear region - MRI of the brain could not be ordered  Diarrhea - GI panel and C. difficile panel ordered  GERD (gastroesophageal reflux disease) - PPI  Paroxysmal A-fib (HCC) - Patient is on metoprolol tartrate 50 mg p.o. twice daily, Eliquis 2.5 mg p.o. twice daily - I have resumed home Eliquis 2.5 BID per home dosing and I am holding metoprolol tartrate due to new sternal presentation of hypotension  Hyperlipidemia - Simvastatin 40 mg daily at 1800 resumed  Gout - Allopurinol 150 mg p.o. daily resumed  Essential (primary) hypertension - Patient takes losartan 25 mg daily, metoprolol succinate 25 mg daily, which have been held on admission due to initial presentation of hypotension - Hydralazine 10 mg p.o. every 6 hours as needed for SBP greater than 180, 3 days with  Chart reviewed.   DVT prophylaxis: Eliquis twice daily Code Status: full code  Diet: Renal/carb modified Family Communication: Updated daughter, at [phone] and daughter wishes to be called for updates at this number Disposition Plan: Pending clinical course Consults called: None at this time Admission status: Telemetry cardiac, observation  Past Medical History:  Diagnosis Date   Arthritis    Atrial fibrillation (HCC)    CHF (congestive heart failure) ([place])    Chronic kidney disease    kidney stones   Coronary artery disease    Diabetes mellitus without complication ([place])     Diverticulosis    GERD (gastroesophageal reflux disease)    Gout    Hyperlipidemia    Hypertension    Myocardial infarction ([place]) 2001   Peripheral vascular disease ([place])    Pneumothorax, left 1950   Past Surgical History:  Procedure Laterality Date   CARDIAC CATHETERIZATION     CARDIAC SURGERY     4 bypass   CHOLECYSTECTOMY     CORONARY ARTERY BYPASS GRAFT     CYSTOSCOPY W/ RETROGRADES N/A 12/03/2014   Procedure: CYSTOSCOPY WITH ATTEMPT FOR RETROGRADE PYELOGRAM;  Surgeon: [name], MD;  Location: [facility] ORS;  Service: Urology;  Laterality: N/A;   CYSTOSCOPY WITH LITHOLAPAXY N/A 12/03/2014   Procedure: CYSTOSCOPY WITH LITHOLAPAXY WITH HOLMIUM LASER ;  Surgeon: [name], MD;  Location: [facility] ORS;  Service: Urology;  Laterality: N/A;   FEMUR SURGERY     GALLBLADDER SURGERY     HERNIA REPAIR     JOINT REPLACEMENT Right    Total Knee Replacement   LUNG SURGERY Left    REPLACEMENT TOTAL KNEE     Social History:  reports that he quit smoking about 15 years ago. His smoking use included pipe. He has never used smokeless tobacco. He reports current alcohol use of about 14.0 standard drinks of alcohol per week. He reports  that he does not use drugs.  Allergies  Allergen Reactions   Codeine    Gabapentin     Other reaction(s): Hallucination   Morphine    Oxycodone    Tramadol    Family History  Problem Relation Age of Onset   Lung cancer Mother    Tuberculosis Brother    Family history: Family history reviewed and not pertinent.  Prior to Admission medications   Medication Sig Start Date End Date Taking? Authorizing Provider  allopurinol (ZYLOPRIM) 300 MG tablet Take 150 mg by mouth daily.  05/09/14   [provider]  apixaban (ELIQUIS) 2.5 MG TABS tablet Take by mouth 2 (two) times daily.    [provider]  aspirin EC 81 MG tablet Take 81 mg by mouth at bedtime.    [provider]  etodolac (LODINE) 400 MG tablet Take 1 tablet by  mouth daily. 04/09/21   [provider]  ferrous sulfate 325 (65 FE) MG tablet Take 1 tablet (325 mg total) by mouth daily. 09/12/19 11/29/19  Sharen Hones, MD  finasteride (PROSCAR) 5 MG tablet Take 1 tablet (5 mg total) by mouth daily. 08/13/18   Billey Co, MD  gabapentin (NEURONTIN) 100 MG capsule Take 100 mg by mouth 2 (two) times daily.    [provider]  ipratropium (ATROVENT) 0.03 % nasal spray Place 2 sprays into both nostrils 3 (three) times daily. 07/19/21   [provider]  loratadine (CLARITIN) 10 MG tablet Take 10 mg by mouth daily as needed. 07/16/19   [provider]  losartan (COZAAR) 25 MG tablet Take 25 mg by mouth daily. 05/10/19   [provider]  metoprolol succinate (TOPROL-XL) 25 MG 24 hr tablet Take 25 mg by mouth daily. 05/10/19   [provider]  Multiple Vitamin (MULTI-VITAMINS) TABS Take 1 tablet by mouth daily.     [provider]  mupirocin ointment (BACTROBAN) 2 % Apply topically 3 (three) times daily. 07/19/21   [provider]  Omega-3 Fatty Acids (FISH OIL) 1000 MG CAPS Take 1 capsule by mouth daily. Patient not taking: Reported on 03/17/2021    [provider]  pantoprazole (PROTONIX) 40 MG tablet Take 40 mg by mouth daily.    [provider]  sertraline (ZOLOFT) 50 MG tablet Take 1 tablet by mouth daily. 06/17/21   [provider]  simvastatin (ZOCOR) 40 MG tablet Take 40 mg by mouth daily at 6 PM.  07/07/14   [provider]  tamsulosin (FLOMAX) 0.4 MG CAPS capsule Take 1 capsule (0.4 mg total) by mouth daily. 08/13/18   Billey Co, MD  torsemide (DEMADEX) 20 MG tablet Take 2 tablets (40 mg total) by mouth daily. 05/28/19 03/29/21  Sidney Ace, MD  traMADol (ULTRAM) 50 MG tablet Take 1 tablet (50 mg total) by mouth every 6 (six) hours as needed. Prn pain Patient not taking: Reported on 03/17/2021 07/28/20   Rodriguez-Southworth, Sunday Spillers, PA-C   colchicine 0.6 MG tablet Take 0.6 mg by mouth as needed.   08/28/18  [provider]   Physical Exam: Vitals:   09/14/21 1431 09/14/21 1434 09/14/21 1437 09/14/21 1524  BP:  127/61  126/60  Pulse:    63  Resp:   19 20  Temp:      TempSrc:      SpO2:    100%  Weight: 91 kg     Height: 5\' 7"  (1.702 m)      Constitutional: appears  age-appropriate, NAD, calm, comfortable Eyes: PERRL, lids and conjunctivae normal ENMT: Mucous membranes are moist. Posterior pharynx clear of any exudate or lesions. Age-appropriate dentition. Hearing appropriate Neck: normal, supple, no masses, no thyromegaly Respiratory: clear to auscultation bilaterally, no wheezing, no crackles. Normal respiratory effort. No accessory muscle use.  Cardiovascular: Regular rate and rhythm, no murmurs / rubs / gallops. No extremity edema. 2+ pedal pulses. No carotid bruits.  Abdomen: no tenderness, no masses palpated, no hepatosplenomegaly. Bowel sounds positive.  Musculoskeletal: no clubbing / cyanosis. No joint deformity upper and lower extremities. Good ROM, no contractures, no atrophy. Normal muscle tone.  Skin: no rashes, lesions, ulcers. No induration.  There is a small papule/circular metal implanted under the right medial corner of his eye. Neurologic: Sensation intact. Strength 5/5 in all 4.  Psychiatric: Normal judgment and insight. Alert and oriented x 3. Normal mood.   EKG: independently reviewed, showing sinus bradycardia with rate of 58, QTc 423  Chest x-ray on Admission: I personally reviewed and I agree with radiologist reading as below.  CT Head Wo Contrast  Result Date: 09/14/2021 CLINICAL DATA:  Headache EXAM: CT HEAD WITHOUT CONTRAST TECHNIQUE: Contiguous axial images were obtained from the base of the skull through the vertex without intravenous contrast. RADIATION DOSE REDUCTION: This exam was performed according to the departmental dose-optimization program which includes automated exposure  control, adjustment of the mA and/or kV according to patient size and/or use of iterative reconstruction technique. COMPARISON:  01/28/2020 FINDINGS: Brain: Diffuse parenchymal atrophy. Patchy areas of hypoattenuation in deep and periventricular white matter bilaterally. Stable lacunar infarct in the left basal ganglia. Negative for acute intracranial hemorrhage, mass lesion, acute infarction, midline shift, or mass-effect. Acute infarct may be inapparent on noncontrast CT. Ventricles and sulci symmetric. Vascular: Atherosclerotic and physiologic intracranial calcifications. Skull: Normal. Negative for fracture or focal lesion. Sinuses/Orbits: No acute finding. Other: None IMPRESSION: 1. Negative for bleed or other acute intracranial process. 2. Atrophy and nonspecific white matter changes. Electronically Signed   By: Lucrezia Europe M.D.   On: 09/14/2021 15:14   DG Chest Port 1 View  Result Date: 09/14/2021 CLINICAL DATA:  Possible sepsis EXAM: PORTABLE CHEST 1 VIEW COMPARISON:  Previous studies including the examination of 01/08/2020 FINDINGS: Transverse diameter heart is slightly increased. There is previous coronary bypass surgery. There are no signs of alveolar pulmonary edema or focal pulmonary consolidation. There are calcified nodules in left lower lung field and left hilum. There is no pleural effusion or pneumothorax. Degenerative changes are noted in both shoulders. IMPRESSION: There are no signs of pulmonary edema or focal pulmonary consolidation. Electronically Signed   By: Elmer Picker M.D.   On: 09/14/2021 15:06    Labs on Admission: I have personally reviewed following labs  CBC: Recent Labs  Lab 09/14/21 1435  WBC 6.4  NEUTROABS 3.7  HGB 10.0*  HCT 32.1*  MCV 101.6*  PLT 824   Basic Metabolic Panel: Recent Labs  Lab 09/14/21 1435  NA 143  K 4.3  CL 108  CO2 29  GLUCOSE 113*  BUN 45*  CREATININE 2.78*  CALCIUM 9.4   GFR: Estimated Creatinine Clearance: 17.9 mL/min  (A) (by C-G formula based on SCr of 2.78 mg/dL (H)).  Liver Function Tests: Recent Labs  Lab 09/14/21 1435  AST 24  ALT 15  ALKPHOS 63  BILITOT 0.7  PROT 6.3*  ALBUMIN 3.4*   Coagulation Profile: Recent Labs  Lab 09/14/21 1435  INR 1.2   CBG: Recent Labs  Lab 09/14/21 1402  GLUCAP 114*   Urine analysis:    Component Value Date/Time   COLORURINE Yellow 12/12/2013 1512   APPEARANCEUR Clear 03/26/2015 1132   LABSPEC 1.006 12/12/2013 1512   PHURINE 5.0 12/12/2013 1512   GLUCOSEU Negative 03/26/2015 1132   GLUCOSEU Negative 12/12/2013 1512   HGBUR Negative 12/12/2013 1512   BILIRUBINUR Negative 03/26/2015 1132   BILIRUBINUR Negative 12/12/2013 1512   KETONESUR Negative 12/12/2013 1512   PROTEINUR Negative 03/26/2015 1132   PROTEINUR Negative 12/12/2013 1512   NITRITE Negative 03/26/2015 1132   NITRITE Negative 12/12/2013 1512   LEUKOCYTESUR Trace (A) 03/26/2015 1132   LEUKOCYTESUR Trace 12/12/2013 1512   Dr. [name] [facility]  If 7PM-7AM, please contact overnight-coverage provider If 7AM-7PM, please contact day coverage provider [email or web address]  09/14/2021, 6:15 PM

## 2021-09-14 NOTE — Assessment & Plan Note (Addendum)
Patient had presented with presyncopal versus syncopal episode.   Query secondary to hypovolemia in setting of diarrhea for 1 week. -Also likely secondary to orthostatic hypotension. -On presentation noted to have systolic blood pressures in the 60s with blood pressure of 65/48. - C. difficile PCR negative, GI pathogen panel negative, blood cultures with no growth to date, urine cultures pending.  SARS coronavirus 2 PCR negative.  -2D echo done with normal EF, grade 1 diastolic dysfunction, no significant valvular abnormalities noted.  -EEG done was normal.  -Blood pressure improved with fluid boluses.  -IV fluids saline lock, however noted to have systolic blood pressures in the 100s and symptomatic with lightheadedness on 09/16/2021, and as such placed back on gentle hydration. -TED hose -Unable to obtain MRI. -Patient monitored on telemetry no significant arrhythmias noted. -Patient resumed back on half home regimen of Lopressor 12.5 mg twice daily for atrial fibrillation. -Orthostatics were checked on discharge which were positive however patient remained asymptomatic. -The rest of patient's antihypertensive medications were held during the hospitalization. -Patient will be discharged home on Lopressor 12.5 mg twice daily, to resume half home dose of torsemide 20 mg daily in 3 to 4 days if continued clinical improvement. -Outpatient follow-up with PCP.

## 2021-09-14 NOTE — ED Provider Triage Note (Signed)
Emergency Medicine Provider Triage Evaluation Note  Sayer Masini, a 88 y.o. male  was evaluated in triage.  Pt complains of headache.  Libby Maw with his adult daughter from the pain clinic with the patient became unresponsive and somnolent.  No reports of LOC but the patient was somewhat despondent.  Review of Systems  Positive: Headache, AMS Negative: FCS  Physical Exam  BP (!) 67/33   Pulse (!) 59   Temp 97.6 F (36.4 C) (Oral)   Resp 20   SpO2 93%  Gen:   Awake, no distress   Resp:  Normal effort  MSK:   Moves extremities without difficulty  Other:    Medical Decision Making  Medically screening exam initiated at 2:28 PM.  Appropriate orders placed.  Taggart Prasad was informed that the remainder of the evaluation will be completed by another provider, this initial triage assessment does not replace that evaluation, and the importance of remaining in the ED until their evaluation is complete.  Geriatric patient to the ED for evaluation of severe sudden headache and AMS.  Patient found to be hypotensive in triage.   Melvenia Needles, PA-C 09/14/21 1430

## 2021-09-14 NOTE — Assessment & Plan Note (Addendum)
-   Patient maintained on home regimen allopurinol 150 mg daily.

## 2021-09-14 NOTE — Assessment & Plan Note (Addendum)
-   Patient takes losartan 25 mg daily, metoprolol succinate 25 mg daily, which have been held on admission due to initial presentation of hypotension - Hydralazine 10 mg p.o. every 6 hours as needed for SBP greater than 180, -Metoprolol was resumed at 12.5 mg twice daily.   -Losartan discontinued on discharge.   -Patient's torsemide may be resumed 3 to 4 days post discharge at a lower dose of 20 mg daily.   -Outpatient follow-up with PCP.

## 2021-09-14 NOTE — Hospital Course (Signed)
Carlos [name] is a 88 year old male with history of hypertension, chronic pain syndrome, paroxysmal atrial fibrillation on Eliquis, CKD stage IV, hyperlipidemia, GERD, BPH, neuropathy, who presents emergency department for chief concerns of unresponsiveness/syncope while at pain clinic.  Initial vitals in the emergency department showed temperature of 97 and improved to 97.6, respiration rate of 19, heart rate 62, blood pressure 65/48 improved to 126/60 with fluid bolus, SPO2 94% on room air.  Serum sodium is 143, potassium 4.3, chloride 108, bicarb 29, BUN of 45, serum creatinine of 2.18, GFR 18, nonfasting blood glucose 113, WBC 6.5, hemoglobin 10, platelets of 183.  Lactic acid was 1.5.  High sensitive troponin was mildly elevated at 18.  CT of the head without contrast was read as negative for bleed or other acute intracranial process.  Atrophy and nonspecific white matter changes  Portable chest x-ray: Read as no signs of pulmonary edema or focal pulmonary consolidation.  ED treatment: Lactated ringer 1 L bolus, sodium chloride 250 mL one-time dose.

## 2021-09-14 NOTE — Assessment & Plan Note (Addendum)
-   Patient maintained on PPI

## 2021-09-15 ENCOUNTER — Inpatient Hospital Stay (HOSPITAL_COMMUNITY): Admit: 2021-09-15 | Payer: Medicare Other

## 2021-09-15 ENCOUNTER — Observation Stay: Payer: Medicare Other

## 2021-09-15 ENCOUNTER — Observation Stay
Admit: 2021-09-15 | Discharge: 2021-09-15 | Disposition: A | Discharge status: Home / Self Care | Payer: Medicare Other | Attending: Internal Medicine | Admitting: Internal Medicine

## 2021-09-15 DIAGNOSIS — R55 Syncope and collapse: Secondary | ICD-10-CM | POA: Diagnosis not present

## 2021-09-15 DIAGNOSIS — I10 Essential (primary) hypertension: Secondary | ICD-10-CM

## 2021-09-15 DIAGNOSIS — G894 Chronic pain syndrome: Secondary | ICD-10-CM | POA: Diagnosis not present

## 2021-09-15 DIAGNOSIS — E785 Hyperlipidemia, unspecified: Secondary | ICD-10-CM

## 2021-09-15 DIAGNOSIS — I480 Paroxysmal atrial fibrillation: Secondary | ICD-10-CM | POA: Diagnosis not present

## 2021-09-15 DIAGNOSIS — N184 Chronic kidney disease, stage 4 (severe): Secondary | ICD-10-CM | POA: Diagnosis not present

## 2021-09-15 DIAGNOSIS — M1A9XX0 Chronic gout, unspecified, without tophus (tophi): Secondary | ICD-10-CM

## 2021-09-15 DIAGNOSIS — R197 Diarrhea, unspecified: Secondary | ICD-10-CM

## 2021-09-15 DIAGNOSIS — E1122 Type 2 diabetes mellitus with diabetic chronic kidney disease: Secondary | ICD-10-CM

## 2021-09-15 DIAGNOSIS — S00459A Superficial foreign body of unspecified ear, initial encounter: Secondary | ICD-10-CM

## 2021-09-15 DIAGNOSIS — K219 Gastro-esophageal reflux disease without esophagitis: Secondary | ICD-10-CM

## 2021-09-15 DIAGNOSIS — I959 Hypotension, unspecified: Secondary | ICD-10-CM

## 2021-09-15 LAB — GASTROINTESTINAL PANEL BY PCR, STOOL (REPLACES STOOL CULTURE)

## 2021-09-15 LAB — CBC
HCT: 28.8 % — ABNORMAL LOW (ref 39.0–52.0)
Hemoglobin: 9.2 g/dL — ABNORMAL LOW (ref 13.0–17.0)
MCH: 32.1 pg (ref 26.0–34.0)
MCHC: 31.9 g/dL (ref 30.0–36.0)
MCV: 100.3 fL — ABNORMAL HIGH (ref 80.0–100.0)
Platelets: 186 10*3/uL (ref 150–400)
RBC: 2.87 MIL/uL — ABNORMAL LOW (ref 4.22–5.81)
RDW: 14.2 % (ref 11.5–15.5)
WBC: 6.2 10*3/uL (ref 4.0–10.5)
nRBC: 0 % (ref 0.0–0.2)

## 2021-09-15 LAB — C DIFFICILE QUICK SCREEN W PCR REFLEX
C Diff antigen: NEGATIVE
C Diff interpretation: NOT DETECTED
C Diff toxin: NEGATIVE

## 2021-09-15 LAB — BASIC METABOLIC PANEL WITH GFR
Anion gap: 7 (ref 5–15)
BUN: 44 mg/dL — ABNORMAL HIGH (ref 8–23)
CO2: 27 mmol/L (ref 22–32)
Calcium: 9.1 mg/dL (ref 8.9–10.3)
Chloride: 108 mmol/L (ref 98–111)
Creatinine, Ser: 2.48 mg/dL — ABNORMAL HIGH (ref 0.61–1.24)
GFR, Estimated: 24 mL/min — ABNORMAL LOW
Glucose, Bld: 97 mg/dL (ref 70–99)
Potassium: 3.6 mmol/L (ref 3.5–5.1)
Sodium: 142 mmol/L (ref 135–145)

## 2021-09-15 LAB — ECHOCARDIOGRAM COMPLETE
Height: 67 in
S' Lateral: 2.8 cm
Weight: 3209.9 [oz_av]

## 2021-09-15 LAB — GLUCOSE, CAPILLARY: Glucose-Capillary: 110 mg/dL — ABNORMAL HIGH (ref 70–99)

## 2021-09-15 LAB — CBG MONITORING, ED: Glucose-Capillary: 102 mg/dL — ABNORMAL HIGH (ref 70–99)

## 2021-09-15 MED ORDER — SODIUM CHLORIDE 0.9 % IV SOLN: INTRAVENOUS | Status: DC

## 2021-09-15 MED ORDER — LOPERAMIDE HCL 2 MG PO CAPS: 2.0000 mg | ORAL_CAPSULE | ORAL | Status: DC | PRN

## 2021-09-15 MED ORDER — ACETAMINOPHEN 500 MG PO TABS: 1000.0000 mg | ORAL_TABLET | Freq: Once | ORAL | Status: DC

## 2021-09-15 NOTE — ED Notes (Signed)
Informed RN bed assigned 

## 2021-09-15 NOTE — Assessment & Plan Note (Addendum)
Stable

## 2021-09-15 NOTE — Progress Notes (Signed)
Unable to have MRI due to bb in eye. Spoke to radiologist, Dr. Pascal Lux and he agreed no MRI at this time. Questions please call [phone].

## 2021-09-15 NOTE — Assessment & Plan Note (Signed)
Stable

## 2021-09-15 NOTE — Progress Notes (Signed)
EEG complete - results pending 

## 2021-09-15 NOTE — Assessment & Plan Note (Addendum)
-   Last hemoglobin A1c noted at 6.0 on 05/23/2019. -Repeat hemoglobin A1c 6.0 (09/16/2021). -Patient not on any home oral hypoglycemic agents. -Likely diet controlled. -CBGs ranging from 91 -110.   -Outpatient follow-up.

## 2021-09-15 NOTE — Procedures (Signed)
Routine EEG Report  Carlos [name] is a 88 y.o. male with a history of presyncope who is undergoing an EEG to evaluate for seizures.  Report: This EEG was acquired with electrodes placed according to the International 10-20 electrode system (including Fp1, Fp2, F3, F4, C3, C4, P3, P4, O1, O2, T3, T4, T5, T6, A1, A2, Fz, Cz, Pz). The following electrodes were missing or displaced: none.  The occipital dominant rhythm was 9 Hz. This activity is reactive to stimulation. Drowsiness was manifested by background fragmentation; deeper stages of sleep were not identified. There was no focal slowing. There were no interictal epileptiform discharges. There were no electrographic seizures identified. There was no abnormal response to photic stimulation or hyperventilation.   Impression: This EEG was obtained while awake and drowsy and is normal.    Clinical Correlation: Normal EEGs, however, do not rule out epilepsy.  Su Monks, MD [facility] [phone]  If 7pm- 7am, please page neurology on call as listed in [place].

## 2021-09-15 NOTE — Progress Notes (Signed)
*  PRELIMINARY RESULTS* Echocardiogram 2D Echocardiogram has been performed.  Sherrie Sport 09/15/2021, 12:54 PM

## 2021-09-15 NOTE — Progress Notes (Signed)
Admission profile updated. ?

## 2021-09-15 NOTE — Evaluation (Signed)
Clinical/Bedside Swallow Evaluation Patient Details  Name: Carlos [name] MRN: [id number] Date of Birth: [birth year]  Today's Date: 09/15/2021 Time: SLP Start Time (ACUTE ONLY): 7 SLP Stop Time (ACUTE ONLY): 0905 SLP Time Calculation (min) (ACUTE ONLY): 45 min  Past Medical History:  Past Medical History:  Diagnosis Date   Arthritis    Atrial fibrillation (HCC)    CHF (congestive heart failure) ([place])    Chronic kidney disease    kidney stones   Coronary artery disease    Diabetes mellitus without complication (HCC)    Diverticulosis    GERD (gastroesophageal reflux disease)    Gout    Hyperlipidemia    Hypertension    Myocardial infarction ([place]) 2001   Peripheral vascular disease ([place])    Pneumothorax, left 1950   Past Surgical History:  Past Surgical History:  Procedure Laterality Date   CARDIAC CATHETERIZATION     CARDIAC SURGERY     4 bypass   CHOLECYSTECTOMY     CORONARY ARTERY BYPASS GRAFT     CYSTOSCOPY W/ RETROGRADES N/A 12/03/2014   Procedure: CYSTOSCOPY WITH ATTEMPT FOR RETROGRADE PYELOGRAM;  Surgeon: [name], MD;  Location: [facility] ORS;  Service: Urology;  Laterality: N/A;   CYSTOSCOPY WITH LITHOLAPAXY N/A 12/03/2014   Procedure: CYSTOSCOPY WITH LITHOLAPAXY WITH HOLMIUM LASER ;  Surgeon: [name], MD;  Location: [facility] OR[facility];  Service: Urology;  Laterality: N/A;   FEMUR SURGERY     GALLBLADDER SURGERY     HERNIA REPAIR     JOINT REPLACEMENT Right    Total Knee Replacement   LUNG SURGERY Left    REPLACEMENT TOTAL KNEE     HPI:  Pt is a 88 year old male with history of hypertension, chronic pain syndrome, paroxysmal atrial fibrillation on Eliquis, CKD stage IV, hyperlipidemia, GERD, BPH, neuropathy, says he takes multiple pills/medications who presents emergency department for chief concerns of a brief period of unresponsiveness/syncope while at pain clinic.  He states he was seen at his pain management clinic for chronic bilateral shoulder pains.  He  denies fever, fever, chest pain, abdominal pain, dysuria, shortness of breath.   He reports diarrhea for the last week. He reports the diarrhea is light in color to dark brown and watery.  Pt admitted w/ dx of pre-syncope w/ Query secondary to hypovolemia in setting of diarrhea for 1 week.    CT of Head: negative; atrophy.  CXR: no signs of pulmonary edema or focal pulmonary  consolidation.    Assessment / Plan / Recommendation  Clinical Impression   Pt seen for BSE this morning. Pt awake, talkative and pleasant. Conversed easily w/ staff. Pt denied any changes in his swallowing. He did c/o "post-nasal drip"; and congestion in his chest "at times" -- pt has a Baseline of GERD and Chronic systolic heart failure; followed by Nephrology also.  Pt is on RA; afebrile, wbc wnl.  Pt appears to present w/ adequate oropharyngeal phase swallow w/ No oropharyngeal phase dysphagia noted, No neuromuscular deficits noted. Pt consumed po trials w/ No immediate, overt clinical s/s of aspiration during po trials. Pt appears at reduced risk for aspiration following general aspiration precautions. However, pt has a Baseline of GERD. ANY Dysmotility or Regurgitation of Reflux material can increase risk for aspiration of the Reflux material during Retrograde backflow thus impact Voicing and Pulmonary status. Also issue of post-nasal drip can be related to GERD/LPR.   During po trials, pt consumed all consistencies w/ no immediate, overt coughing, decline in vocal  quality, or change in respiratory presentation during/post trials. O2 sats remained in upper 90s. Oral phase appeared [facility] w/ timely bolus management, mastication, and control of bolus propulsion for A-P transfer for swallowing. Oral clearing achieved w/ all trial consistencies.  OM Exam appeared [facility] w/ no unilateral weakness noted. Speech Clear. Pt fed self.   Recommend continue a Regular consistency diet w/ well-Cut meats, moistened foods; Thin liquids. Recommend  general aspiration precautions, REFLUX precautions. Pills w/ liquids or whole in Puree for safer, easier swallowing if needed. Education given on Pills in Puree; food consistencies and easy to eat options; general aspiration and REFLUX precautions. NSG to reconsult if any new needs arise. Pt and NSG agreed. SLP Visit Diagnosis: Dysphagia, unspecified (R13.10)    Aspiration Risk   (reduced following general aspiration precautions)    Diet Recommendation   Regular consistency diet w/ well-Cut meats, moistened foods; Thin liquids. Recommend general aspiration precautions, REFLUX precautions  Medication Administration: Whole meds with liquid (or whole in puree if needed for larger pills)    Other  Recommendations Recommended Consults:  (n/a) Oral Care Recommendations: Oral care BID;Oral care before and after PO;Patient independent with oral care Other Recommendations:  (n/a)    Recommendations for follow up therapy are one component of a multi-disciplinary discharge planning process, led by the attending physician.  Recommendations may be updated based on patient status, additional functional criteria and insurance authorization.  Follow up Recommendations No SLP follow up      Assistance Recommended at Discharge None  Functional Status Assessment Patient has not had a recent decline in their functional status (pt denied any changes in swallowing)  Frequency and Duration  (n/a)   (n/a)       Prognosis Prognosis for Safe Diet Advancement: Good Barriers to Reach Goals:  (n/a) Barriers/Prognosis Comment: GERD      Swallow Study   General Date of Onset: 09/14/21 HPI: Pt is a 88 year old male with history of hypertension, chronic pain syndrome, paroxysmal atrial fibrillation on Eliquis, CKD stage IV, hyperlipidemia, GERD, BPH, neuropathy, says he takes multiple pills/medications who presents emergency department for chief concerns of a brief period of unresponsiveness/syncope while at pain  clinic.  He states he was seen at his pain management clinic for chronic bilateral shoulder pains.  He denies fever, fever, chest pain, abdominal pain, dysuria, shortness of breath.   He reports diarrhea for the last week. He reports the diarrhea is light in color to dark brown and watery.  Pt admitted w/ dx of pre-syncope w/ Query secondary to hypovolemia in setting of diarrhea for 1 week.   CT of Head: negative; atrophy.  CXR: no signs of pulmonary edema or focal pulmonary  consolidation. Type of Study: Bedside Swallow Evaluation Previous Swallow Assessment: none Diet Prior to this Study: Regular;Thin liquids Temperature Spikes Noted: No (wbc 6.2) Respiratory Status: Room air History of Recent Intubation: No Behavior/Cognition: Alert;Cooperative;Pleasant mood Oral Cavity Assessment: Within Functional Limits;Dry (min+) Oral Care Completed by SLP: Yes Oral Cavity - Dentition: Adequate natural dentition Vision: Functional for self-feeding Self-Feeding Abilities: Able to feed self Patient Positioning: Upright in bed (supported) Baseline Vocal Quality: Normal Volitional Cough: Strong Volitional Swallow: Able to elicit    Oral/Motor/Sensory Function Overall Oral Motor/Sensory Function: Within functional limits   Ice Chips Ice chips: Within functional limits Presentation: Spoon (fed; 2 trials)   Thin Liquid Thin Liquid: Within functional limits Presentation: Self Fed;Cup;Straw (~6ozs total) Other Comments: water, juice    Nectar Thick Nectar Thick Liquid: Not  tested   Honey Thick Honey Thick Liquid: Not tested   Puree Puree: Within functional limits Presentation: Self Fed;Spoon (several tsps)   Solid     Solid: Within functional limits Presentation: Self Fed (6+ bites) Other Comments: moistened        Orinda Kenner, MS, CCC-SLP Speech Language Pathologist Rehab Services; [place] [phone] (ascom) Carlos [name] 09/15/2021,9:59 AM

## 2021-09-15 NOTE — Assessment & Plan Note (Signed)
See above

## 2021-09-15 NOTE — Progress Notes (Signed)
PROGRESS NOTE    Carlos [name]  KDT:[id number] DOB: [birth year] DOA: 09/14/2021 PCP: [name], MD    Chief Complaint  Patient presents with   unresponsive   Headache    Brief Narrative:  [name] is a 88 year old male with history of hypertension, chronic pain syndrome, paroxysmal atrial fibrillation on Eliquis, CKD stage IV, hyperlipidemia, GERD, BPH, neuropathy, who presents emergency department for chief concerns of unresponsiveness/syncope while at pain clinic.  Initial vitals in the emergency department showed temperature of 97 and improved to 97.6, respiration rate of 19, heart rate 62, blood pressure 65/48 improved to 126/60 with fluid bolus, SPO2 94% on room air.  Serum sodium is 143, potassium 4.3, chloride 108, bicarb 29, BUN of 45, serum creatinine of 2.18, GFR 18, nonfasting blood glucose 113, WBC 6.5, hemoglobin 10, platelets of 183.  Lactic acid was 1.5.  High sensitive troponin was mildly elevated at 18.  CT of the head without contrast was read as negative for bleed or other acute intracranial process.  Atrophy and nonspecific white matter changes  Portable chest x-ray: Read as no signs of pulmonary edema or focal pulmonary consolidation.  ED treatment: Lactated ringer 1 L bolus, sodium chloride 250 mL one-time dose.    Assessment & Plan:  Principal Problem:   Pre-syncope Active Problems:   Essential (primary) hypertension   Gout   AF (paroxysmal atrial fibrillation) (HCC)   Diabetes mellitus, type 2 (HCC)   Hyperlipidemia   CKD (chronic kidney disease), stage IV (HCC)   Paroxysmal A-fib (HCC)   Chronic pain syndrome   GERD (gastroesophageal reflux disease)   Diarrhea   Metal foreign body in ear region   Hypotension   Syncope    Assessment and Plan: * Pre-syncope Altered mental status Query secondary to hypovolemia in setting of diarrhea for 1 week. -On presentation noted to have systolic blood pressures in the 60s with blood pressure  of 65/48. - Etiology work-up in progress at this time - Cultured, C. difficile PCR negative, GI pathogen panel negative, blood cultures pending, urine cultures pending.  SARS coronavirus 2 PCR negative.  -2D echo done with normal EF, grade 1 diastolic dysfunction, no significant valvular abnormalities noted.  -EEG done was normal.  -Blood pressure improved with fluid boluses.  -Gentle hydration.  -Monitor on telemetry for the next 24 hours. -Continue to hold antihypertensive medications.  Metal foreign body in ear region - MRI of the brain could not be ordered  Diarrhea - GI pathogen panel, C. difficile PCR negative.   -Place on Imodium as needed.   GERD (gastroesophageal reflux disease) - PPI  Chronic pain syndrome - Stable  Paroxysmal A-fib (HCC) - Patient is on metoprolol tartrate 50 mg p.o. twice daily, Eliquis 2.5 mg p.o. twice daily - Continue to hold metoprolol for now, continue Eliquis 2.5 mg twice daily.  -Once blood pressure improves could likely resume home regimen metoprolol tomorrow.   CKD (chronic kidney disease), stage IV (HCC) - Stable. -Monitor closely with gentle hydration.  Hyperlipidemia - Continue home regimen simvastatin.    Diabetes mellitus, type 2 (HCC) - Last hemoglobin A1c noted at 6.0 on 05/23/2019. -Repeat hemoglobin A1c. -Patient not on any home oral hypoglycemic agents. -Likely diet controlled. -Check CBGs before meals and at bedtime.  AF (paroxysmal atrial fibrillation) ([place]) - See above.  Gout - Continue allopurinol 150 mg daily.    Essential (primary) hypertension - Patient takes losartan 25 mg daily, metoprolol succinate 25 mg daily, which have been  held on admission due to initial presentation of hypotension - Hydralazine 10 mg p.o. every 6 hours as needed for SBP greater than 180, -If continued improvement could likely resume metoprolol tomorrow.         DVT prophylaxis: Eliquis Code Status: Full Family Communication:  Updated patient.  No family at bedside. Disposition: Likely home once work-up is completed, no further presyncopal episodes hopefully in 24 hours.  Status is: Observation The patient remains OBS appropriate and will d/c before 2 midnights.   Consultants:  None  Procedures:  EEG 09/15/2021 2D echo 09/15/2021 CT head without contrast 09/14/2021 Chest x-ray 09/14/2021   Antimicrobials:  None   Subjective: Sitting up in bed in the ED.  No chest pain.  No shortness of breath.  No abdominal pain.  No palpitations.  Patient no further syncopal episodes.  Per RN patient ambulated to restroom and patient states had a good bowel movement this morning.  Tolerating current diet.  Objective: Vitals:   09/15/21 0505 09/15/21 0836 09/15/21 1200 09/15/21 1345  BP: (!) 129/59 128/65 127/70 (!) 130/53  Pulse: 67 75 65 69  Resp: 18 20 18 18   Temp: 98.3 F (36.8 C) 98.7 F (37.1 C) 98.6 F (37 C) 98 F (36.7 C)  TempSrc: Oral Oral Oral   SpO2: 95% 97% 98% 95%  Weight:      Height:        Intake/Output Summary (Last 24 hours) at 09/15/2021 1706 Last data filed at 09/15/2021 1334 Gross per 24 hour  Intake 1490 ml  Output --  Net 1490 ml   Filed Weights   09/14/21 1431  Weight: 91 kg    Examination:  General exam: Appears calm and comfortable  Respiratory system: Clear to auscultation bilaterally.  No wheezes, no crackles, no rhonchi.  Fair air movement.  Speaking in full sentences. Respiratory effort normal. Cardiovascular system: S1 & S2 heard, RRR. No JVD, murmurs, rubs, gallops or clicks. No pedal edema. Gastrointestinal system: Abdomen is nondistended, soft and nontender. No organomegaly or masses felt. Normal bowel sounds heard. Central nervous system: Alert and oriented. No focal neurological deficits. Extremities: Symmetric 5 x 5 power. Skin: No rashes, lesions or ulcers Psychiatry: Judgement and insight appear normal. Mood & affect appropriate.     Data Reviewed:    CBC: Recent Labs  Lab 09/14/21 1435 09/14/21 1838 09/15/21 0500  WBC 6.4 5.8 6.2  NEUTROABS 3.7  --   --   HGB 10.0* 9.1* 9.2*  HCT 32.1* 29.1* 28.8*  MCV 101.6* 100.3* 100.3*  PLT 183 178 440    Basic Metabolic Panel: Recent Labs  Lab 09/14/21 1435 09/15/21 0500  NA 143 142  K 4.3 3.6  CL 108 108  CO2 29 27  GLUCOSE 113* 97  BUN 45* 44*  CREATININE 2.78* 2.48*  CALCIUM 9.4 9.1    GFR: Estimated Creatinine Clearance: 20 mL/min (A) (by C-G formula based on SCr of 2.48 mg/dL (H)).  Liver Function Tests: Recent Labs  Lab 09/14/21 1435  AST 24  ALT 15  ALKPHOS 63  BILITOT 0.7  PROT 6.3*  ALBUMIN 3.4*    CBG: Recent Labs  Lab 09/14/21 1402 09/15/21 0406  GLUCAP 114* 102*     Recent Results (from the past 240 hour(s))  Culture, blood (Routine x 2)     Status: None (Preliminary result)   Collection Time: 09/14/21  2:30 PM   Specimen: BLOOD  Result Value Ref Range Status   Specimen Description BLOOD RIGHT  ANTECUBITAL  Final   Special Requests   Final    BOTTLES DRAWN AEROBIC AND ANAEROBIC Blood Culture results may not be optimal due to an excessive volume of blood received in culture bottles   Culture   Final    NO GROWTH < 24 HOURS Performed at [facility], [address] [place], [place] [address]    Report Status PENDING  Incomplete  Culture, blood (Routine x 2)     Status: None (Preliminary result)   Collection Time: 09/14/21  2:35 PM   Specimen: BLOOD  Result Value Ref Range Status   Specimen Description BLOOD LEFT ANTECUBITAL  Final   Special Requests   Final    BOTTLES DRAWN AEROBIC AND ANAEROBIC Blood Culture results may not be optimal due to an inadequate volume of blood received in culture bottles   Culture   Final    NO GROWTH < 24 HOURS Performed at [facility], [address], [place], [place] [address]    Report Status PENDING  Incomplete  SARS Coronavirus 2 by RT PCR (hospital order, performed in [place] hospital lab) *cepheid single result test* Anterior Nasal Swab     Status: None   Collection Time: 09/14/21  6:38 PM   Specimen: Anterior Nasal Swab  Result Value Ref Range Status   SARS Coronavirus 2 by RT PCR NEGATIVE NEGATIVE Final    Comment: (NOTE) SARS-CoV-2 target nucleic acids are NOT DETECTED.  The SARS-CoV-2 RNA is generally detectable in upper and lower respiratory specimens during the acute phase of infection. The lowest concentration of SARS-CoV-2 viral copies this assay can detect is 250 copies / mL. A negative result does not preclude SARS-CoV-2 infection and should not be used as the sole basis for treatment or other patient management decisions.  A negative result may occur with improper specimen collection / handling, submission of specimen other than nasopharyngeal swab, presence of viral mutation(s) within the areas targeted by this assay, and inadequate number of viral copies (<250 copies / mL). A negative result must be combined with clinical observations, patient history, and epidemiological information.  Fact Sheet for Patients:   https://www.patel.info/  Fact Sheet for Healthcare Providers: https://hall.com/  This test is not yet approved or  cleared by the [place] FDA and has been authorized for detection and/or diagnosis of SARS-CoV-2 by FDA under an Emergency Use Authorization (EUA).  This EUA will remain in effect (meaning this test can be used) for the duration of the COVID-19 declaration under Section 564(b)(1) of the Act, 21 U.S.C. section 360bbb-3(b)(1), unless the authorization is terminated or revoked sooner.  Performed at [facility], [address] [place], [place] [address]   Gastrointestinal Panel by PCR , Stool     Status: None   Collection Time: 09/15/21  9:30 AM   Specimen: Stool  Result Value Ref Range Status   Campylobacter species NOT DETECTED NOT DETECTED Final    Plesimonas shigelloides NOT DETECTED NOT DETECTED Final   Salmonella species NOT DETECTED NOT DETECTED Final   Yersinia enterocolitica NOT DETECTED NOT DETECTED Final   Vibrio species NOT DETECTED NOT DETECTED Final   Vibrio cholerae NOT DETECTED NOT DETECTED Final   Enteroaggregative E coli (EAEC) NOT DETECTED NOT DETECTED Final   Enteropathogenic E coli (EPEC) NOT DETECTED NOT DETECTED Final   Enterotoxigenic E coli (ETEC) NOT DETECTED NOT DETECTED Final   Shiga like toxin producing E coli (STEC) NOT DETECTED NOT DETECTED Final   Shigella/Enteroinvasive E  coli (EIEC) NOT DETECTED NOT DETECTED Final   Cryptosporidium NOT DETECTED NOT DETECTED Final   Cyclospora cayetanensis NOT DETECTED NOT DETECTED Final   Entamoeba histolytica NOT DETECTED NOT DETECTED Final   Giardia lamblia NOT DETECTED NOT DETECTED Final   Adenovirus F40/41 NOT DETECTED NOT DETECTED Final   Astrovirus NOT DETECTED NOT DETECTED Final   Norovirus GI/GII NOT DETECTED NOT DETECTED Final   Rotavirus A NOT DETECTED NOT DETECTED Final   Sapovirus (I, II, IV, and V) NOT DETECTED NOT DETECTED Final    Comment: Performed at [facility], [address] [place], [place] [address]  C Difficile Quick Screen w PCR reflex     Status: None   Collection Time: 10/05/21  9:30 AM   Specimen: STOOL  Result Value Ref Range Status   C Diff antigen NEGATIVE NEGATIVE Final   C Diff toxin NEGATIVE NEGATIVE Final   C Diff interpretation No C. difficile detected.  Final    Comment: Performed at [facility], [address] [place], [place] [address]         Radiology Studies: EEG adult  Result Date: 10-05-2021 Derek Jack, MD     2021-10-05  2:53 PM Routine EEG Report Tymere Foskey is a 88 y.o. male with a history of presyncope who is undergoing an EEG to evaluate for seizures. Report: This EEG was acquired with electrodes placed according to the International 10-20 electrode system (including Fp1, Fp2, F3, F4,  C3, C4, P3, P4, O1, O2, T3, T4, T5, T6, A1, A2, Fz, Cz, Pz). The following electrodes were missing or displaced: none. The occipital dominant rhythm was 9 Hz. This activity is reactive to stimulation. Drowsiness was manifested by background fragmentation; deeper stages of sleep were not identified. There was no focal slowing. There were no interictal epileptiform discharges. There were no electrographic seizures identified. There was no abnormal response to photic stimulation or hyperventilation. Impression: This EEG was obtained while awake and drowsy and is normal.   Clinical Correlation: Normal EEGs, however, do not rule out epilepsy. Su Monks, MD [facility] [phone] If 7pm- 7am, please page neurology on call as listed in [place].   ECHOCARDIOGRAM COMPLETE  Result Date: 2021-10-05    ECHOCARDIOGRAM REPORT   Patient Name:   DWAN HEMMELGARN Date of Exam: 2021-10-05 Medical Rec #:  [id number]   Height:       67.0 in Accession #:    [id number]  Weight:       200.6 lb Date of Birth:  1926/08/26   BSA:          2.025 m Patient Age:    40 years    BP:           128/65 mmHg Patient Gender: M           HR:           75 bpm. Exam Location:  [facility] Procedure: 2D Echo, Cardiac Doppler and Color Doppler Indications:     Syncope R55  History:         Patient has prior history of Echocardiogram examinations, most                  recent 04/28/2021. CHF, Arrythmias:Atrial Fibrillation; Risk                  Factors:Hypertension and Diabetes.  Sonographer:     Sherrie Sport Referring Phys:  [id number] AMY N COX Diagnosing Phys: Yolonda Kida MD  Sonographer  Comments: Technically challenging study due to limited acoustic windows, no apical window and no subcostal window. IMPRESSIONS  1. Left ventricular ejection fraction, by estimation, is 65 to 70%. The left ventricle has normal function. The left ventricle has no regional wall motion abnormalities. There is mild left ventricular hypertrophy. Left ventricular  diastolic parameters are consistent with Grade I diastolic dysfunction (impaired relaxation).  2. Right ventricular systolic function is normal. The right ventricular size is normal.  3. The mitral valve is normal in structure. No evidence of mitral valve regurgitation.  4. The aortic valve is normal in structure. Aortic valve regurgitation is not visualized. Conclusion(s)/Recommendation(s): Poor windows for evaluation of left ventricular function by transthoracic echocardiography. Would recommend an alternative means of evaluation. FINDINGS  Left Ventricle: Left ventricular ejection fraction, by estimation, is 65 to 70%. The left ventricle has normal function. The left ventricle has no regional wall motion abnormalities. The left ventricular internal cavity size was normal in size. There is  mild left ventricular hypertrophy. Left ventricular diastolic parameters are consistent with Grade I diastolic dysfunction (impaired relaxation). Right Ventricle: The right ventricular size is normal. No increase in right ventricular wall thickness. Right ventricular systolic function is normal. Left Atrium: Left atrial size was normal in size. Right Atrium: Right atrial size was normal in size. Pericardium: There is no evidence of pericardial effusion. Mitral Valve: The mitral valve is normal in structure. No evidence of mitral valve regurgitation. Tricuspid Valve: The tricuspid valve is normal in structure. Tricuspid valve regurgitation is trivial. Aortic Valve: The aortic valve is normal in structure. Aortic valve regurgitation is not visualized. Pulmonic Valve: The pulmonic valve was normal in structure. Pulmonic valve regurgitation is not visualized. Aorta: The ascending aorta was not well visualized. IAS/Shunts: No atrial level shunt detected by color flow Doppler.  LEFT VENTRICLE PLAX 2D LVIDd:         4.50 cm LVIDs:         2.80 cm LV PW:         1.60 cm LV IVS:        1.30 cm LVOT diam:     2.00 cm LVOT Area:     3.14  cm  LEFT ATRIUM         Index LA diam:    4.30 cm 2.12 cm/m   AORTA Ao Root diam: 3.27 cm  SHUNTS Systemic Diam: 2.00 cm Yolonda Kida MD Electronically signed by Yolonda Kida MD Signature Date/Time: 09/15/2021/2:25:12 PM    Final    CT Head Wo Contrast  Result Date: 09/14/2021 CLINICAL DATA:  Headache EXAM: CT HEAD WITHOUT CONTRAST TECHNIQUE: Contiguous axial images were obtained from the base of the skull through the vertex without intravenous contrast. RADIATION DOSE REDUCTION: This exam was performed according to the departmental dose-optimization program which includes automated exposure control, adjustment of the mA and/or kV according to patient size and/or use of iterative reconstruction technique. COMPARISON:  01/28/2020 FINDINGS: Brain: Diffuse parenchymal atrophy. Patchy areas of hypoattenuation in deep and periventricular white matter bilaterally. Stable lacunar infarct in the left basal ganglia. Negative for acute intracranial hemorrhage, mass lesion, acute infarction, midline shift, or mass-effect. Acute infarct may be inapparent on noncontrast CT. Ventricles and sulci symmetric. Vascular: Atherosclerotic and physiologic intracranial calcifications. Skull: Normal. Negative for fracture or focal lesion. Sinuses/Orbits: No acute finding. Other: None IMPRESSION: 1. Negative for bleed or other acute intracranial process. 2. Atrophy and nonspecific white matter changes. Electronically Signed   By: Lucrezia Europe  M.D.   On: 09/14/2021 15:14   DG Chest Port 1 View  Result Date: 09/14/2021 CLINICAL DATA:  Possible sepsis EXAM: PORTABLE CHEST 1 VIEW COMPARISON:  Previous studies including the examination of 01/08/2020 FINDINGS: Transverse diameter heart is slightly increased. There is previous coronary bypass surgery. There are no signs of alveolar pulmonary edema or focal pulmonary consolidation. There are calcified nodules in left lower lung field and left hilum. There is no pleural effusion or  pneumothorax. Degenerative changes are noted in both shoulders. IMPRESSION: There are no signs of pulmonary edema or focal pulmonary consolidation. Electronically Signed   By: Elmer Picker M.D.   On: 09/14/2021 15:06        Scheduled Meds:  allopurinol  150 mg Oral Daily   apixaban  2.5 mg Oral BID   finasteride  5 mg Oral Daily   multivitamin with minerals  1 tablet Oral Daily   pantoprazole  40 mg Oral Daily   sertraline  50 mg Oral Daily   simvastatin  40 mg Oral q1800   sodium chloride flush  3 mL Intravenous Q12H   Continuous Infusions:  sodium chloride 50 mL/hr at 09/15/21 1114     LOS: 0 days    Time spent: 40 minutes    Irine Seal, MD [facility]   To contact the attending provider between 7A-7P or the covering provider during after hours 7P-7A, please log into the web site [email or web address] and access using universal [place] password for that web site. If you do not have the password, please call the hospital operator.  09/15/2021, 5:06 PM

## 2021-09-16 DIAGNOSIS — R197 Diarrhea, unspecified: Secondary | ICD-10-CM | POA: Diagnosis present

## 2021-09-16 DIAGNOSIS — M25511 Pain in right shoulder: Secondary | ICD-10-CM | POA: Diagnosis present

## 2021-09-16 DIAGNOSIS — E1140 Type 2 diabetes mellitus with diabetic neuropathy, unspecified: Secondary | ICD-10-CM | POA: Diagnosis present

## 2021-09-16 DIAGNOSIS — Z20822 Contact with and (suspected) exposure to covid-19: Secondary | ICD-10-CM | POA: Diagnosis present

## 2021-09-16 DIAGNOSIS — Z888 Allergy status to other drugs, medicaments and biological substances status: Secondary | ICD-10-CM | POA: Diagnosis not present

## 2021-09-16 DIAGNOSIS — M109 Gout, unspecified: Secondary | ICD-10-CM | POA: Diagnosis present

## 2021-09-16 DIAGNOSIS — K219 Gastro-esophageal reflux disease without esophagitis: Secondary | ICD-10-CM | POA: Diagnosis present

## 2021-09-16 DIAGNOSIS — E1122 Type 2 diabetes mellitus with diabetic chronic kidney disease: Secondary | ICD-10-CM | POA: Diagnosis present

## 2021-09-16 DIAGNOSIS — T465X5A Adverse effect of other antihypertensive drugs, initial encounter: Secondary | ICD-10-CM | POA: Diagnosis present

## 2021-09-16 DIAGNOSIS — T502X5A Adverse effect of carbonic-anhydrase inhibitors, benzothiadiazides and other diuretics, initial encounter: Secondary | ICD-10-CM | POA: Diagnosis present

## 2021-09-16 DIAGNOSIS — R55 Syncope and collapse: Secondary | ICD-10-CM | POA: Diagnosis present

## 2021-09-16 DIAGNOSIS — Y92009 Unspecified place in unspecified non-institutional (private) residence as the place of occurrence of the external cause: Secondary | ICD-10-CM | POA: Diagnosis not present

## 2021-09-16 DIAGNOSIS — E785 Hyperlipidemia, unspecified: Secondary | ICD-10-CM | POA: Diagnosis present

## 2021-09-16 DIAGNOSIS — Z79899 Other long term (current) drug therapy: Secondary | ICD-10-CM | POA: Diagnosis not present

## 2021-09-16 DIAGNOSIS — I951 Orthostatic hypotension: Secondary | ICD-10-CM | POA: Diagnosis present

## 2021-09-16 DIAGNOSIS — I480 Paroxysmal atrial fibrillation: Secondary | ICD-10-CM | POA: Diagnosis present

## 2021-09-16 DIAGNOSIS — Z7901 Long term (current) use of anticoagulants: Secondary | ICD-10-CM | POA: Diagnosis not present

## 2021-09-16 DIAGNOSIS — I129 Hypertensive chronic kidney disease with stage 1 through stage 4 chronic kidney disease, or unspecified chronic kidney disease: Secondary | ICD-10-CM | POA: Diagnosis present

## 2021-09-16 DIAGNOSIS — N184 Chronic kidney disease, stage 4 (severe): Secondary | ICD-10-CM | POA: Diagnosis present

## 2021-09-16 DIAGNOSIS — T169XXA Foreign body in ear, unspecified ear, initial encounter: Secondary | ICD-10-CM | POA: Diagnosis present

## 2021-09-16 DIAGNOSIS — E669 Obesity, unspecified: Secondary | ICD-10-CM | POA: Diagnosis present

## 2021-09-16 DIAGNOSIS — E861 Hypovolemia: Secondary | ICD-10-CM | POA: Diagnosis present

## 2021-09-16 DIAGNOSIS — Z6830 Body mass index (BMI) 30.0-30.9, adult: Secondary | ICD-10-CM | POA: Diagnosis not present

## 2021-09-16 DIAGNOSIS — Z885 Allergy status to narcotic agent status: Secondary | ICD-10-CM | POA: Diagnosis not present

## 2021-09-16 DIAGNOSIS — M25512 Pain in left shoulder: Secondary | ICD-10-CM | POA: Diagnosis present

## 2021-09-16 DIAGNOSIS — G894 Chronic pain syndrome: Secondary | ICD-10-CM | POA: Diagnosis present

## 2021-09-16 LAB — URINALYSIS, COMPLETE (UACMP) WITH MICROSCOPIC
Bilirubin Urine: NEGATIVE
Glucose, UA: NEGATIVE mg/dL
Hgb urine dipstick: NEGATIVE
Ketones, ur: NEGATIVE mg/dL
Nitrite: NEGATIVE
Protein, ur: 30 mg/dL — AB
Specific Gravity, Urine: 1.012 (ref 1.005–1.030)
Squamous Epithelial / HPF: NONE SEEN (ref 0–5)
WBC, UA: >50 WBC/hpf — ABNORMAL HIGH (ref 0–5)
pH: 7 (ref 5.0–8.0)

## 2021-09-16 LAB — GLUCOSE, CAPILLARY
Glucose-Capillary: 96 mg/dL (ref 70–99)
Glucose-Capillary: 91 mg/dL (ref 70–99)

## 2021-09-16 LAB — BASIC METABOLIC PANEL
Anion gap: 4 — ABNORMAL LOW (ref 5–15)
BUN: 37 mg/dL — ABNORMAL HIGH (ref 8–23)
CO2: 26 mmol/L (ref 22–32)
Calcium: 9.1 mg/dL (ref 8.9–10.3)
Chloride: 113 mmol/L — ABNORMAL HIGH (ref 98–111)
Creatinine, Ser: 2.23 mg/dL — ABNORMAL HIGH (ref 0.61–1.24)
GFR, Estimated: 27 mL/min — ABNORMAL LOW (ref >60)
Glucose, Bld: 101 mg/dL — ABNORMAL HIGH (ref 70–99)
Potassium: 3.6 mmol/L (ref 3.5–5.1)
Sodium: 143 mmol/L (ref 135–145)

## 2021-09-16 LAB — CBC
HCT: 28.1 % — ABNORMAL LOW (ref 39.0–52.0)
Hemoglobin: 9.1 g/dL — ABNORMAL LOW (ref 13.0–17.0)
MCH: 32.2 pg (ref 26.0–34.0)
MCHC: 32.4 g/dL (ref 30.0–36.0)
MCV: 99.3 fL (ref 80.0–100.0)
Platelets: 185 10*3/uL (ref 150–400)
RBC: 2.83 MIL/uL — ABNORMAL LOW (ref 4.22–5.81)
RDW: 14.1 % (ref 11.5–15.5)
WBC: 6.4 10*3/uL (ref 4.0–10.5)
nRBC: 0 % (ref 0.0–0.2)

## 2021-09-16 LAB — HEMOGLOBIN A1C
Hgb A1c MFr Bld: 6 % — ABNORMAL HIGH (ref 4.8–5.6)
Mean Plasma Glucose: 125.5 mg/dL

## 2021-09-16 LAB — MAGNESIUM: Magnesium: 2.4 mg/dL (ref 1.7–2.4)

## 2021-09-16 MED ORDER — SODIUM CHLORIDE 0.9 % IV SOLN
INTRAVENOUS | Status: DC
Start: 2021-09-16 — End: 2021-09-17

## 2021-09-16 MED ORDER — METOPROLOL TARTRATE 25 MG PO TABS: 25.0000 mg | ORAL_TABLET | Freq: Two times a day (BID) | ORAL | Status: DC

## 2021-09-16 MED ORDER — LORATADINE 10 MG PO TABS
10.0000 mg | ORAL_TABLET | Freq: Every day | ORAL | Status: DC
Start: 2021-09-16 — End: 2021-09-17
  Administered 2021-09-16 – 2021-09-17 (×2): 10 mg via ORAL
  Filled 2021-09-16 (×2): qty 1

## 2021-09-16 MED ORDER — GUAIFENESIN ER 600 MG PO TB12
1200.0000 mg | ORAL_TABLET | Freq: Two times a day (BID) | ORAL | Status: DC
Start: 2021-09-16 — End: 2021-09-17
  Administered 2021-09-16 – 2021-09-17 (×3): 1200 mg via ORAL
  Filled 2021-09-16 (×3): qty 2

## 2021-09-16 MED ORDER — METOPROLOL TARTRATE 25 MG PO TABS
12.5000 mg | ORAL_TABLET | Freq: Two times a day (BID) | ORAL | Status: DC
  Administered 2021-09-16 – 2021-09-17 (×2): 12.5 mg via ORAL
  Filled 2021-09-16 (×2): qty 1

## 2021-09-16 MED ORDER — SODIUM CHLORIDE 0.9 % IV BOLUS
250.0000 mL | Freq: Once | INTRAVENOUS | Status: AC
Start: 2021-09-16 — End: 2021-09-16
  Administered 2021-09-16: 250 mL via INTRAVENOUS

## 2021-09-16 MED ORDER — METOPROLOL TARTRATE 50 MG PO TABS: 50.0000 mg | ORAL_TABLET | Freq: Two times a day (BID) | ORAL | Status: DC

## 2021-09-16 MED ORDER — LORATADINE 10 MG PO TABS: 10.0000 mg | ORAL_TABLET | Freq: Every day | ORAL | Status: DC | PRN

## 2021-09-16 MED ORDER — TORSEMIDE 20 MG PO TABS: 20.0000 mg | ORAL_TABLET | Freq: Every day | ORAL | Status: DC

## 2021-09-16 NOTE — Progress Notes (Signed)
Physical Therapy Evaluation Patient Details Name: Carlos [name] MRN: [id number] DOB: 10-21-1926 Today's Date: 09/16/2021  History of Present Illness  Pt is a 88 y.o. male with PMH including HTN, DM, A-fib on Eliquis, CHF, CKD stage IV, CAD, HLD, GERD, BPH, neuropathy, and chronic pain syndrome. Pt presented to ED on 09/14/21 following episode of unresponsiveness/syncope and HA while at pain clinic. Pt admitted for AMS and near syncope with query secondary to hypovolemia in setting of diarrhea for 1 week.   Clinical Impression  Pt is a pleasant 88 year old male who was admitted for AMS and near-syncopal event. Pt performs bed mobility with mod I-min A, transfers and ambulation with RW and mod I. Pt requires increased time/effort to complete tasks, but is at baseline level with mobility. Pt asked for a handhold to sit EOB, but stated at home he uses momentum for transfers. Pt ambulated 20 feet in room with RW, but was able to stop and take hands off walker and reach outside BOS without LOB. Pt reported independent with ADLs and mod I with RW for mobility tasks. Pt stated he does need physical therapy at this time and agreeable to PT signing off. Due to pts baseline level of functioning, discharging PT at this time with no PT follow up. This entire session was guided, instructed, and directly supervised by Greggory Stallion, PT, DPT, GCS.   Recommendations for follow up therapy are one component of a multi-disciplinary discharge planning process, led by the attending physician.  Recommendations may be updated based on patient status, additional functional criteria and insurance authorization.  Follow Up Recommendations No PT follow up      Assistance Recommended at Discharge PRN  Patient can return home with the following  A little help with walking and/or transfers;Assist for transportation;Help with stairs or ramp for entrance    Equipment Recommendations None recommended by PT  Recommendations for  Other Services       Functional Status Assessment Patient has had a recent decline in their functional status and demonstrates the ability to make significant improvements in function in a reasonable and predictable amount of time.     Precautions / Restrictions Precautions Precautions: Fall Restrictions Weight Bearing Restrictions: No      Mobility  Bed Mobility Overal bed mobility: Needs Assistance Bed Mobility: Sit to Supine, Supine to Sit     Supine to sit: Min assist Sit to supine: Modified independent (Device/Increase time)   General bed mobility comments: Pt asked for handhold assist to sit EOB. Pt mod I to transfer from sitting to supine. Pt stated at home he uses momentum for bed mobility/transfers mod I    Transfers Overall transfer level: Needs assistance Equipment used: Rolling walker (2 wheels) Transfers: Sit to/from Stand Sit to Stand: Modified independent (Device/Increase time)           General transfer comment: increased time/effort with pt requiring 2 attempts to stand. pt stated it is his baseline, using momentum for transfers    Ambulation/Gait Ambulation/Gait assistance: Modified independent (Device/Increase time) Gait Distance (Feet): 20 Feet Assistive device: Rolling walker (2 wheels) Gait Pattern/deviations: WFL(Within Functional Limits)       General Gait Details: pt ambulated to room door and back to bed with step-through pattern, RW, and mod I  Stairs            Wheelchair Mobility    Modified Rankin (Stroke Patients Only)       Balance Overall balance assessment: Modified Independent (static sitting  EOB no UE support; utilized bilat UE support for ambulation, but able to remove UE support and maintain balance in standing to reach for door handles, etc.)                                           Pertinent Vitals/Pain Pain Assessment Pain Assessment: No/denies pain    Home Living Family/patient expects  to be discharged to:: Private residence Living Arrangements: Alone (daughter currently staying with pt) Available Help at Discharge: Family;Available PRN/intermittently Type of Home: House Home Access: Stairs to enter   Entrance Stairs-Number of Steps: 4   Home Layout: One level Home Equipment: Conservation officer, nature (2 wheels);Rollator (4 wheels);Cane - single point;Grab bars - tub/shower;Shower seat - built in (3 rollators at home)      Prior Function Prior Level of Function : Independent/Modified Independent             Mobility Comments: Pt reported ambulatory with rollator/RW at home and in community. ADLs Comments: Pt reported independent with ADLs.     Hand Dominance        Extremity/Trunk Assessment   Upper Extremity Assessment Upper Extremity Assessment: Overall WFL for tasks assessed    Lower Extremity Assessment Lower Extremity Assessment: Overall WFL for tasks assessed    Cervical / Trunk Assessment Cervical / Trunk Assessment: Normal  Communication   Communication: No difficulties  Cognition Arousal/Alertness: Awake/alert Behavior During Therapy: WFL for tasks assessed/performed Overall Cognitive Status: Within Functional Limits for tasks assessed                                 General Comments: A&Ox4        General Comments      Exercises     Assessment/Plan    PT Assessment Patient does not need any further PT services  PT Problem List         PT Treatment Interventions      PT Goals (Current goals can be found in the Care Plan section)  Acute Rehab PT Goals Patient Stated Goal: to go home PT Goal Formulation: With patient Time For Goal Achievement: 09/16/21 Potential to Achieve Goals: Good    Frequency       Co-evaluation               AM-PAC PT "6 Clicks" Mobility  Outcome Measure Help needed turning from your back to your side while in a flat bed without using bedrails?: None Help needed moving from lying  on your back to sitting on the side of a flat bed without using bedrails?: A Little Help needed moving to and from a bed to a chair (including a wheelchair)?: A Little Help needed standing up from a chair using your arms (e.g., wheelchair or bedside chair)?: A Little Help needed to walk in hospital room?: A Little Help needed climbing 3-5 steps with a railing? : A Little 6 Click Score: 19    End of Session Equipment Utilized During Treatment: Gait belt Activity Tolerance: Patient tolerated treatment well Patient left: in bed;with call bell/phone within reach;with bed alarm set Nurse Communication: Mobility status PT Visit Diagnosis: Difficulty in walking, not elsewhere classified (R26.2)    Time: 6045-4098 PT Time Calculation (min) (ACUTE ONLY): 16 min   Charges:  Dream Nodal, SPT  Esma Kilts 09/16/2021, 1:39 PM

## 2021-09-16 NOTE — Assessment & Plan Note (Addendum)
-   Patient noted to be hypotensive on presentation felt likely etiology of patient's presyncope versus syncopal episode. -Patient noted to be orthostatic -Felt likely secondary to hypovolemia in the setting of diuretics and antihypertensive medications. -Infectious work-up to date negative. -Urine cultures pending with no growth to date by day of discharge.  Patient remained afebrile.  -Blood cultures with no growth to date. -C. difficile PCR negative, GI pathogen panel negative. -CT head negative for any acute abnormalities. -Unable to do MRI brain due to metallic foreign body. -Patient gently hydrated . -Lopressor was resumed at half home dose of 12.5 mg twice daily and patient's other oral antihypertensive medications including diuretics were held throughout the hospitalization.  -Orthostatics were checked on day of discharge which were positive however patient remained asymptomatic. -Patient was placed on TED hose. -Patient was discharged home in stable and improved condition with outpatient follow-up with PCP closely. -Patient was discharged on Lopressor 12.5 mg twice daily due to history of atrial fibrillation, torsemide will be held and may be resumed 3 to 4 days post discharge at a lower dose of 20 mg daily.  Patient's ARB discontinued on discharge. -If patient becomes symptomatic with orthostasis may need to consider placing on midodrine which may be started in the outpatient setting.

## 2021-09-16 NOTE — TOC Initial Note (Addendum)
Transition of Care [facility]) - Initial/Assessment Note    Patient Details  Name: Carlos [name] MRN: [id number] Date of Birth: 1926/11/15  [facility] CM/SW Contact:    Pete Pelt, RN Phone Number: 09/16/2021, 1:10 PM  Clinical Narrative:    Patient lives at home alone, daughter stays with him 3 weeks out of the month.  He has several care agencies caring for him at home.  [facility], [facility] and [facility] all assist patient when daughter is not staying with him. (She is at patient residence now, and will stay to help with discharge and after.)   patient has DME at home as per daughter.               Expected Discharge Plan:  (tbd) Barriers to Discharge: Continued Medical Work up   Patient Goals and CMS Choice        Expected Discharge Plan and Services Expected Discharge Plan:  (tbd)   Discharge Planning Services: CM Consult Post Acute Care Choice:  ([facility] and [facility], has stair lift grab bars walkers and urinal at home) Living arrangements for the past 2 months: Single Family Home                           HH Arranged: Nurse's Aide          Prior Living Arrangements/Services Living arrangements for the past 2 months: Single Family Home Lives with:: Self Patient language and need for interpreter reviewed:: Yes (spoke to aptient's daughter via phone due to patient sleeping) Do you feel safe going back to the place where you live?: Yes      Need for Family Participation in Patient Care: Yes (Comment) Care giver support system in place?: Yes (comment) Current home services: Homehealth aide Criminal Activity/Legal Involvement Pertinent to Current Situation/Hospitalization: No - Comment as needed  Activities of Daily Living Home Assistive Devices/Equipment: Walker (specify type), Eyeglasses ADL Screening (condition at time of admission) Patient's cognitive ability adequate to safely complete daily activities?: Yes Is the  patient deaf or have difficulty hearing?: No Does the patient have difficulty seeing, even when wearing glasses/contacts?: No Does the patient have difficulty concentrating, remembering, or making decisions?: No Patient able to express need for assistance with ADLs?: Yes Does the patient have difficulty dressing or bathing?: No Independently performs ADLs?: Yes (appropriate for developmental age) Does the patient have difficulty walking or climbing stairs?: No Weakness of Legs: None Weakness of Arms/Hands: None  Permission Sought/Granted Permission sought to share information with : Case Manager Permission granted to share information with : Yes, Verbal Permission Granted              Emotional Assessment Appearance:: Appears stated age       Alcohol / Substance Use: Not Applicable Psych Involvement: No (comment)  Admission diagnosis:  Pre-syncope [R55] Hypotension, unspecified hypotension type [I95.9] Syncope, unspecified syncope type [R55] Patient Active Problem List   Diagnosis Date Noted   Hypotension    Syncope    Pre-syncope 09/14/2021   GERD (gastroesophageal reflux disease) 09/14/2021   Diarrhea 09/14/2021   Metal foreign body in ear region 09/14/2021   Bilateral rotator cuff dysfunction 03/17/2021   History of rotator cuff surgery 03/17/2021   Lumbar facet arthropathy 03/17/2021   Lumbar degenerative disc disease 03/17/2021   Chronic pain syndrome 03/17/2021   Anemia of chronic disease 09/10/2019   Edema of both lower legs due to peripheral venous insufficiency  09/10/2019   Edema of both lower extremities due to peripheral venous insufficiency 09/10/2019   Acute on chronic combined systolic and diastolic CHF (congestive heart failure) ([place]) 05/23/2019   Acute pain of left shoulder 09/08/2015   CKD (chronic kidney disease), stage IV ([place]) 12/24/2014   Bradycardia 12/02/2014   TI (tricuspid incompetence) 10/21/2014   DD (diverticular disease) 07/30/2014    Essential (primary) hypertension 07/30/2014   Tuberculosis 07/30/2014   Hyperlipidemia 84/66/5993   Chronic systolic CHF (congestive heart failure), NYHA class 2 ([place]) 02/12/2014   AF (paroxysmal atrial fibrillation) ([place]) 02/12/2014   Paroxysmal A-fib ([place]) 02/12/2014   Accumulation of fluid in tissues 07/24/2013   Gout 07/24/2013   Diabetes mellitus, type 2 ([place]) 07/24/2013   Type 2 diabetes mellitus with renal manifestations, controlled ([place]) 07/24/2013   Peripheral blood vessel disorder ([place]) 07/19/2013   PVD (peripheral vascular disease) ([place]) 07/19/2013   Combined fat and carbohydrate induced hyperlipemia 05/21/2013   Encounter for therapeutic drug monitoring 12/18/2012   Chronic kidney disease (CKD), stage IV (severe) ([place]) 12/18/2012   Arteriosclerosis of coronary artery 12/18/2012   Decreased potassium in the blood 12/18/2012   H/O coronary artery bypass surgery 12/18/2012   H/O total knee replacement 12/18/2012   Encounter for therapeutic drug level monitoring 12/18/2012   PCP:  [name], MD Pharmacy:   [facility] [place], [address] [facility] [address] AT [address] [facility] [place] [address] Phone: [phone] Fax: [phone]     Social Determinants of Health (SDOH) Interventions    Readmission Risk Interventions     No data to display

## 2021-09-16 NOTE — Plan of Care (Signed)

## 2021-09-16 NOTE — Progress Notes (Signed)
OT Cancellation Note  Patient Details Name: Carlos [name] MRN: [id number] DOB: Dec 12, 1926   Cancelled Treatment:    Reason Eval/Treat Not Completed: OT screened, no needs identified, will sign off. Per PT report, pt is back at baseline and declines further therapy assessment. OT to complete orders.   Darleen Crocker, MS, OTR/L , CBIS ascom [phone]  09/16/21, 10:29 AM

## 2021-09-16 NOTE — Progress Notes (Addendum)
PROGRESS NOTE    Carlos [name]  HLK:[id number] DOB: 1926-05-27 DOA: 09/14/2021 PCP: [name], MD    Chief Complaint  Patient presents with   unresponsive   Headache    Brief Narrative:  Carlos [name] is a 88 year old male with history of hypertension, chronic pain syndrome, paroxysmal atrial fibrillation on Eliquis, CKD stage IV, hyperlipidemia, GERD, BPH, neuropathy, who presents emergency department for chief concerns of unresponsiveness/syncope while at pain clinic.  Initial vitals in the emergency department showed temperature of 97 and improved to 97.6, respiration rate of 19, heart rate 62, blood pressure 65/48 improved to 126/60 with fluid bolus, SPO2 94% on room air.  Serum sodium is 143, potassium 4.3, chloride 108, bicarb 29, BUN of 45, serum creatinine of 2.18, GFR 18, nonfasting blood glucose 113, WBC 6.5, hemoglobin 10, platelets of 183.  Lactic acid was 1.5.  High sensitive troponin was mildly elevated at 18.  CT of the head without contrast was read as negative for bleed or other acute intracranial process.  Atrophy and nonspecific white matter changes  Portable chest x-ray: Read as no signs of pulmonary edema or focal pulmonary consolidation.  ED treatment: Lactated ringer 1 L bolus, sodium chloride 250 mL one-time dose.    Assessment & Plan:  Principal Problem:   Pre-syncope Active Problems:   Essential (primary) hypertension   Gout   AF (paroxysmal atrial fibrillation) (HCC)   Diabetes mellitus, type 2 (HCC)   Hyperlipidemia   CKD (chronic kidney disease), stage IV (HCC)   Paroxysmal A-fib (HCC)   Chronic pain syndrome   GERD (gastroesophageal reflux disease)   Diarrhea   Metal foreign body in ear region   Hypotension   Syncope    Assessment and Plan: * Pre-syncope Patient had presented with presyncopal versus syncopal episode.   Query secondary to hypovolemia in setting of diarrhea for 1 week. -On presentation noted to have systolic  blood pressures in the 60s with blood pressure of 65/48. - Etiology work-up in progress at this time - Cultured, C. difficile PCR negative, GI pathogen panel negative, blood cultures with no growth to date, urine cultures pending.  SARS coronavirus 2 PCR negative.  -2D echo done with normal EF, grade 1 diastolic dysfunction, no significant valvular abnormalities noted.  -EEG done was normal.  -Blood pressure improved with fluid boluses.  -IV fluids saline lock, however noted to have systolic blood pressures in the 100s and symptomatic with lightheadedness and as such placed back on gentle hydration. -TED hose -Unable to obtain MRI. -Monitor on telemetry for the next 24 hours. -We will resume Lopressor 12.5 mg twice daily due to history of paroxysmal atrial fibrillation. -Continue to hold other antihypertensive medications.  Hypotension - Patient noted to be hypotensive on presentation felt likely etiology of patient's presyncope versus syncopal episode. -Felt likely secondary to hypovolemia in the setting of diuretics and antihypertensive medications. -Infectious work-up to date negative. -Check UA with cultures and sensitivities. -CT head negative for any acute abnormalities. -Unable to do MRI brain due to metallic foreign body. -Gentle hydration for another 24 hours. -Resume home regimen Lopressor 12.5 mg twice daily and hold other oral antihypertensive medications including diuretics. -If hypotension persist tomorrow despite hydration we will place on a trial of midodrine.  Metal foreign body in ear region - MRI of the brain could not be ordered  Diarrhea - GI pathogen panel, C. difficile PCR negative.   -Discontinue enteric precautions. -Imodium as needed.  GERD (gastroesophageal reflux disease) -  PPI  Chronic pain syndrome - Stable  Paroxysmal A-fib (HCC) - Patient is on metoprolol tartrate 50 mg p.o. twice daily, Eliquis 2.5 mg p.o. twice daily - Patient noted to be  hypotensive on admission, systolic blood pressures in the low 100s today and as such we will resume metoprolol at 12.5 mg twice daily.   -Continue Eliquis 2.5 mg twice daily.   -Follow.  CKD (chronic kidney disease), stage IV (HCC) - Stable. -Monitor closely with gentle hydration.  Hyperlipidemia - Continue home regimen simvastatin.    Diabetes mellitus, type 2 (HCC) - Last hemoglobin A1c noted at 6.0 on 05/23/2019. -Repeat hemoglobin A1c 6.0 (09/16/2021). -Patient not on any home oral hypoglycemic agents. -Likely diet controlled. -CBGs ranging from 91 -110.   -Discontinue CBG checks.   -Outpatient follow-up.  AF (paroxysmal atrial fibrillation) ([place]) - See above.  Gout - Continue allopurinol 150 mg daily.    Essential (primary) hypertension - Patient takes losartan 25 mg daily, metoprolol succinate 25 mg daily, which have been held on admission due to initial presentation of hypotension - Hydralazine 10 mg p.o. every 6 hours as needed for SBP greater than 180, -Resume metoprolol 12.5 mg twice daily. -We will likely not resume losartan on discharge and place on half home dose diuretic on discharge if continued improvement with hypotension.         DVT prophylaxis: Eliquis Code Status: Full Family Communication: Updated patient.  Updated daughter on the telephone. Disposition: Likely home once work-up is completed, no further presyncopal episodes with improvement with hypotension hopefully in 24 hours.  Status is: Inpatient    Consultants:  None  Procedures:  EEG 09/15/2021 2D echo 09/15/2021 CT head without contrast 09/14/2021 Chest x-ray 09/14/2021   Antimicrobials:  None   Subjective: Patient laying in bed.  No chest pain.  No shortness of breath.  No abdominal pain.  Complain of some congestion.  Overall feeling better.   Was called by RN that patient with systolic blood pressures in the 100s with complaints of lightheadedness and dizziness.     Objective: Vitals:   09/16/21 0459 09/16/21 0822 09/16/21 1157 09/16/21 1615  BP:  (!) 152/78 100/66 (!) 152/64  Pulse:  80 79 79  Resp:  18 18 18   Temp:  98.7 F (37.1 C) 97.9 F (36.6 C) 98.8 F (37.1 C)  TempSrc:   Oral   SpO2:  95% 94% 93%  Weight: 90.3 kg     Height:        Intake/Output Summary (Last 24 hours) at 09/16/2021 1929 Last data filed at 09/16/2021 1800 Gross per 24 hour  Intake 2092.15 ml  Output 2250 ml  Net -157.85 ml   Filed Weights   09/14/21 1431 09/16/21 0459  Weight: 91 kg 90.3 kg    Examination:  General exam: Appears calm and comfortable  Respiratory system: Clear to auscultation bilaterally.  No wheezes, no crackles, no rhonchi.  Fair air movement.  Speaking in full sentences. Respiratory effort normal. Cardiovascular system: S1 & S2 heard, RRR. No JVD, murmurs, rubs, gallops or clicks. No pedal edema. Gastrointestinal system: Abdomen is nondistended, soft and nontender. No organomegaly or masses felt. Normal bowel sounds heard. Central nervous system: Alert and oriented. No focal neurological deficits. Extremities: Symmetric 5 x 5 power. Skin: No rashes, lesions or ulcers Psychiatry: Judgement and insight appear normal. Mood & affect appropriate.     Data Reviewed:   CBC: Recent Labs  Lab 09/14/21 1435 09/14/21 1838 09/15/21 0500 09/16/21 8469  WBC 6.4 5.8 6.2 6.4  NEUTROABS 3.7  --   --   --   HGB 10.0* 9.1* 9.2* 9.1*  HCT 32.1* 29.1* 28.8* 28.1*  MCV 101.6* 100.3* 100.3* 99.3  PLT 183 178 186 568    Basic Metabolic Panel: Recent Labs  Lab 09/14/21 1435 09/15/21 0500 09/16/21 0634  NA 143 142 143  K 4.3 3.6 3.6  CL 108 108 113*  CO2 29 27 26   GLUCOSE 113* 97 101*  BUN 45* 44* 37*  CREATININE 2.78* 2.48* 2.23*  CALCIUM 9.4 9.1 9.1  MG  --   --  2.4    GFR: Estimated Creatinine Clearance: 22.2 mL/min (A) (by C-G formula based on SCr of 2.23 mg/dL (H)).  Liver Function Tests: Recent Labs  Lab 09/14/21 1435   AST 24  ALT 15  ALKPHOS 63  BILITOT 0.7  PROT 6.3*  ALBUMIN 3.4*    CBG: Recent Labs  Lab 09/14/21 1402 09/15/21 0406 09/15/21 2140 09/16/21 0518 09/16/21 0824  GLUCAP 114* 102* 110* 96 91     Recent Results (from the past 240 hour(s))  Culture, blood (Routine x 2)     Status: None (Preliminary result)   Collection Time: 09/14/21  2:30 PM   Specimen: BLOOD  Result Value Ref Range Status   Specimen Description BLOOD RIGHT ANTECUBITAL  Final   Special Requests   Final    BOTTLES DRAWN AEROBIC AND ANAEROBIC Blood Culture results may not be optimal due to an excessive volume of blood received in culture bottles   Culture   Final    NO GROWTH 2 DAYS Performed at [facility], [address] [place], [place] [address]    Report Status PENDING  Incomplete  Culture, blood (Routine x 2)     Status: None (Preliminary result)   Collection Time: 09/14/21  2:35 PM   Specimen: BLOOD  Result Value Ref Range Status   Specimen Description BLOOD LEFT ANTECUBITAL  Final   Special Requests   Final    BOTTLES DRAWN AEROBIC AND ANAEROBIC Blood Culture results may not be optimal due to an inadequate volume of blood received in culture bottles   Culture   Final    NO GROWTH 2 DAYS Performed at [facility], [address], [place], [place] [address]    Report Status PENDING  Incomplete  SARS Coronavirus 2 by RT PCR (hospital order, performed in [place] hospital lab) *cepheid single result test* Anterior Nasal Swab     Status: None   Collection Time: 09/14/21  6:38 PM   Specimen: Anterior Nasal Swab  Result Value Ref Range Status   SARS Coronavirus 2 by RT PCR NEGATIVE NEGATIVE Final    Comment: (NOTE) SARS-CoV-2 target nucleic acids are NOT DETECTED.  The SARS-CoV-2 RNA is generally detectable in upper and lower respiratory specimens during the acute phase of infection. The lowest concentration of SARS-CoV-2 viral copies this assay can detect is 250 copies /  mL. A negative result does not preclude SARS-CoV-2 infection and should not be used as the sole basis for treatment or other patient management decisions.  A negative result may occur with improper specimen collection / handling, submission of specimen other than nasopharyngeal swab, presence of viral mutation(s) within the areas targeted by this assay, and inadequate number of viral copies (<250 copies / mL). A negative result must be combined with clinical observations, patient history, and epidemiological information.  Fact Sheet for Patients:   https://www.patel.info/  Fact  Sheet for Healthcare Providers: https://hall.com/  This test is not yet approved or  cleared by the [place] and has been authorized for detection and/or diagnosis of SARS-CoV-2 by FDA under an Emergency Use Authorization (EUA).  This EUA will remain in effect (meaning this test can be used) for the duration of the COVID-19 declaration under Section 564(b)(1) of the Act, 21 U.S.C. section 360bbb-3(b)(1), unless the authorization is terminated or revoked sooner.  Performed at [facility], [address] [place], [place] [address]   Gastrointestinal Panel by PCR , Stool     Status: None   Collection Time: 09-19-2021  9:30 AM   Specimen: Stool  Result Value Ref Range Status   Campylobacter species NOT DETECTED NOT DETECTED Final   Plesimonas shigelloides NOT DETECTED NOT DETECTED Final   Salmonella species NOT DETECTED NOT DETECTED Final   Yersinia enterocolitica NOT DETECTED NOT DETECTED Final   Vibrio species NOT DETECTED NOT DETECTED Final   Vibrio cholerae NOT DETECTED NOT DETECTED Final   Enteroaggregative E coli (EAEC) NOT DETECTED NOT DETECTED Final   Enteropathogenic E coli (EPEC) NOT DETECTED NOT DETECTED Final   Enterotoxigenic E coli (ETEC) NOT DETECTED NOT DETECTED Final   Shiga like toxin producing E coli (STEC) NOT DETECTED NOT  DETECTED Final   Shigella/Enteroinvasive E coli (EIEC) NOT DETECTED NOT DETECTED Final   Cryptosporidium NOT DETECTED NOT DETECTED Final   Cyclospora cayetanensis NOT DETECTED NOT DETECTED Final   Entamoeba histolytica NOT DETECTED NOT DETECTED Final   Giardia lamblia NOT DETECTED NOT DETECTED Final   Adenovirus F40/41 NOT DETECTED NOT DETECTED Final   Astrovirus NOT DETECTED NOT DETECTED Final   Norovirus GI/GII NOT DETECTED NOT DETECTED Final   Rotavirus A NOT DETECTED NOT DETECTED Final   Sapovirus (I, II, IV, and V) NOT DETECTED NOT DETECTED Final    Comment: Performed at [facility], [address] [place], [place] [address]  C Difficile Quick Screen w PCR reflex     Status: None   Collection Time: 2021/09/19  9:30 AM   Specimen: STOOL  Result Value Ref Range Status   C Diff antigen NEGATIVE NEGATIVE Final   C Diff toxin NEGATIVE NEGATIVE Final   C Diff interpretation No C. difficile detected.  Final    Comment: Performed at [facility], [address] [place], [place] [address]         Radiology Studies: EEG adult  Result Date: 19-Sep-2021 Derek Jack, MD     09/19/21  2:53 PM Routine EEG Report Lenox Seibert is a 88 y.o. male with a history of presyncope who is undergoing an EEG to evaluate for seizures. Report: This EEG was acquired with electrodes placed according to the International 10-20 electrode system (including Fp1, Fp2, F3, F4, C3, C4, P3, P4, O1, O2, T3, T4, T5, T6, A1, A2, Fz, Cz, Pz). The following electrodes were missing or displaced: none. The occipital dominant rhythm was 9 Hz. This activity is reactive to stimulation. Drowsiness was manifested by background fragmentation; deeper stages of sleep were not identified. There was no focal slowing. There were no interictal epileptiform discharges. There were no electrographic seizures identified. There was no abnormal response to photic stimulation or hyperventilation. Impression: This EEG was  obtained while awake and drowsy and is normal.   Clinical Correlation: Normal EEGs, however, do not rule out epilepsy. Su Monks, MD [facility] [phone] If 7pm- 7am, please page neurology on call as listed in [place].  ECHOCARDIOGRAM COMPLETE  Result Date: 09/15/2021    ECHOCARDIOGRAM REPORT   Patient Name:   KEONDRICK DILKS Date of Exam: 09/15/2021 Medical Rec #:  [id number]   Height:       67.0 in Accession #:    [id number]  Weight:       200.6 lb Date of Birth:  [birth year]   BSA:          2.025 m Patient Age:    68 years    BP:           128/65 mmHg Patient Gender: M           HR:           75 bpm. Exam Location:  [facility] Procedure: 2D Echo, Cardiac Doppler and Color Doppler Indications:     Syncope R55  History:         Patient has prior history of Echocardiogram examinations, most                  recent 04/28/2021. CHF, Arrythmias:Atrial Fibrillation; Risk                  Factors:Hypertension and Diabetes.  Sonographer:     Sherrie Sport Referring Phys:  [id number] AMY N COX Diagnosing Phys: Yolonda Kida MD  Sonographer Comments: Technically challenging study due to limited acoustic windows, no apical window and no subcostal window. IMPRESSIONS  1. Left ventricular ejection fraction, by estimation, is 65 to 70%. The left ventricle has normal function. The left ventricle has no regional wall motion abnormalities. There is mild left ventricular hypertrophy. Left ventricular diastolic parameters are consistent with Grade I diastolic dysfunction (impaired relaxation).  2. Right ventricular systolic function is normal. The right ventricular size is normal.  3. The mitral valve is normal in structure. No evidence of mitral valve regurgitation.  4. The aortic valve is normal in structure. Aortic valve regurgitation is not visualized. Conclusion(s)/Recommendation(s): Poor windows for evaluation of left ventricular function by transthoracic echocardiography. Would recommend an alternative means of  evaluation. FINDINGS  Left Ventricle: Left ventricular ejection fraction, by estimation, is 65 to 70%. The left ventricle has normal function. The left ventricle has no regional wall motion abnormalities. The left ventricular internal cavity size was normal in size. There is  mild left ventricular hypertrophy. Left ventricular diastolic parameters are consistent with Grade I diastolic dysfunction (impaired relaxation). Right Ventricle: The right ventricular size is normal. No increase in right ventricular wall thickness. Right ventricular systolic function is normal. Left Atrium: Left atrial size was normal in size. Right Atrium: Right atrial size was normal in size. Pericardium: There is no evidence of pericardial effusion. Mitral Valve: The mitral valve is normal in structure. No evidence of mitral valve regurgitation. Tricuspid Valve: The tricuspid valve is normal in structure. Tricuspid valve regurgitation is trivial. Aortic Valve: The aortic valve is normal in structure. Aortic valve regurgitation is not visualized. Pulmonic Valve: The pulmonic valve was normal in structure. Pulmonic valve regurgitation is not visualized. Aorta: The ascending aorta was not well visualized. IAS/Shunts: No atrial level shunt detected by color flow Doppler.  LEFT VENTRICLE PLAX 2D LVIDd:         4.50 cm LVIDs:         2.80 cm LV PW:         1.60 cm LV IVS:        1.30 cm LVOT diam:     2.00 cm LVOT Area:  3.14 cm  LEFT ATRIUM         Index LA diam:    4.30 cm 2.12 cm/m   AORTA Ao Root diam: 3.27 cm  SHUNTS Systemic Diam: 2.00 cm Dwayne D Callwood MD Electronically signed by Yolonda Kida MD Signature Date/Time: 09/15/2021/2:25:12 PM    Final         Scheduled Meds:  acetaminophen  1,000 mg Oral Once   allopurinol  150 mg Oral Daily   apixaban  2.5 mg Oral BID   finasteride  5 mg Oral Daily   guaiFENesin  1,200 mg Oral BID   loratadine  10 mg Oral Daily   metoprolol tartrate  12.5 mg Oral BID   multivitamin  with minerals  1 tablet Oral Daily   pantoprazole  40 mg Oral Daily   sertraline  50 mg Oral Daily   simvastatin  40 mg Oral q1800   sodium chloride flush  3 mL Intravenous Q12H   Continuous Infusions:  sodium chloride 50 mL/hr at 09/16/21 1253     LOS: 0 days    Time spent: 40 minutes    Irine Seal, MD [facility]   To contact the attending provider between 7A-7P or the covering provider during after hours 7P-7A, please log into the web site [email or web address] and access using universal [place] password for that web site. If you do not have the password, please call the hospital operator.  09/16/2021, 7:29 PM

## 2021-09-17 DIAGNOSIS — R55 Syncope and collapse: Secondary | ICD-10-CM | POA: Diagnosis not present

## 2021-09-17 DIAGNOSIS — I480 Paroxysmal atrial fibrillation: Secondary | ICD-10-CM | POA: Diagnosis not present

## 2021-09-17 DIAGNOSIS — N184 Chronic kidney disease, stage 4 (severe): Secondary | ICD-10-CM | POA: Diagnosis not present

## 2021-09-17 DIAGNOSIS — G894 Chronic pain syndrome: Secondary | ICD-10-CM | POA: Diagnosis not present

## 2021-09-17 LAB — BASIC METABOLIC PANEL
Anion gap: 4 — ABNORMAL LOW (ref 5–15)
BUN: 30 mg/dL — ABNORMAL HIGH (ref 8–23)
CO2: 23 mmol/L (ref 22–32)
Calcium: 9.2 mg/dL (ref 8.9–10.3)
Chloride: 115 mmol/L — ABNORMAL HIGH (ref 98–111)
Creatinine, Ser: 2.03 mg/dL — ABNORMAL HIGH (ref 0.61–1.24)
GFR, Estimated: 30 mL/min — ABNORMAL LOW (ref >60)
Glucose, Bld: 88 mg/dL (ref 70–99)
Potassium: 3.7 mmol/L (ref 3.5–5.1)
Sodium: 142 mmol/L (ref 135–145)

## 2021-09-17 LAB — CBC
HCT: 27.8 % — ABNORMAL LOW (ref 39.0–52.0)
Hemoglobin: 8.9 g/dL — ABNORMAL LOW (ref 13.0–17.0)
MCH: 31.6 pg (ref 26.0–34.0)
MCHC: 32 g/dL (ref 30.0–36.0)
MCV: 98.6 fL (ref 80.0–100.0)
Platelets: 176 10*3/uL (ref 150–400)
RBC: 2.82 MIL/uL — ABNORMAL LOW (ref 4.22–5.81)
RDW: 14.3 % (ref 11.5–15.5)
WBC: 6 10*3/uL (ref 4.0–10.5)
nRBC: 0 % (ref 0.0–0.2)

## 2021-09-17 MED ORDER — IPRATROPIUM-ALBUTEROL 0.5-2.5 (3) MG/3ML IN SOLN
3.0000 mL | Freq: Three times a day (TID) | RESPIRATORY_TRACT | Status: DC
  Administered 2021-09-17: 3 mL via RESPIRATORY_TRACT
  Filled 2021-09-17: qty 3

## 2021-09-17 MED ORDER — METOPROLOL TARTRATE 25 MG PO TABS: 12.5000 mg | ORAL_TABLET | Freq: Two times a day (BID) | ORAL | 1 refills | Status: AC

## 2021-09-17 MED ORDER — GUAIFENESIN ER 600 MG PO TB12: 1200.0000 mg | ORAL_TABLET | Freq: Two times a day (BID) | ORAL | Status: AC | PRN

## 2021-09-17 MED ORDER — TAMSULOSIN HCL 0.4 MG PO CAPS: 0.4000 mg | ORAL_CAPSULE | Freq: Every day | ORAL | 3 refills | Status: AC

## 2021-09-17 MED ORDER — LOPERAMIDE HCL 2 MG PO CAPS: 2.0000 mg | ORAL_CAPSULE | ORAL | 0 refills | Status: AC | PRN

## 2021-09-17 MED ORDER — IPRATROPIUM-ALBUTEROL 0.5-2.5 (3) MG/3ML IN SOLN: 3.0000 mL | Freq: Four times a day (QID) | RESPIRATORY_TRACT | Status: DC | PRN

## 2021-09-17 MED ORDER — TORSEMIDE 20 MG PO TABS: 20.0000 mg | ORAL_TABLET | Freq: Every day | ORAL | 0 refills | Status: AC

## 2021-09-17 NOTE — Plan of Care (Signed)

## 2021-09-17 NOTE — Discharge Summary (Signed)
Physician Discharge Summary  Carlos [name] JAS:[id number] DOB: Sep 15, 1926 DOA: 09/14/2021  PCP: [name], MD  Admit date: 09/14/2021 Discharge date: 09/17/2021  Time spent: 60 minutes  Recommendations for Outpatient Follow-up:  Follow-up with [name], MD in 1 week.  On follow-up patient's orthostasis will need to be reassessed and if patient is symptomatic may consider starting patient on midodrine.  Patient's diuretics will need to be reassessed.  Patient will need a basic metabolic profile done to follow-up on electrolytes and renal function. Follow-up with [name], cardiology in 2 weeks.  On follow-up patient's cardiac medications will need to be reassessed as patient noted to have orthostatic hypotension however on day of discharge was asymptomatic.   Discharge Diagnoses:  Principal Problem:   Pre-syncope Active Problems:   Essential (primary) hypertension   Gout   AF (paroxysmal atrial fibrillation) (HCC)   Diabetes mellitus, type 2 (HCC)   Hyperlipidemia   CKD (chronic kidney disease), stage IV (HCC)   Paroxysmal A-fib (HCC)   Chronic pain syndrome   GERD (gastroesophageal reflux disease)   Diarrhea   Metal foreign body in ear region   Hypotension   Syncope   Discharge Condition: Stable and improved  Diet recommendation: Heart healthy  Filed Weights   09/14/21 1431 09/16/21 0459 09/17/21 0500  Weight: 91 kg 90.3 kg 88.6 kg    History of present illness:  HPI per Dr. [name] [name] is a 88 year old male with history of hypertension, chronic pain syndrome, paroxysmal atrial fibrillation on Eliquis, CKD stage IV, hyperlipidemia, GERD, BPH, neuropathy, who presents emergency department for chief concerns of unresponsiveness/syncope while at pain clinic.   Initial vitals in the emergency department showed temperature of 97 and improved to 97.6, respiration rate of 19, heart rate 62, blood pressure 65/48 improved to 126/60 with fluid bolus, SPO2 94%  on room air.  Serum sodium is 143, potassium 4.3, chloride 108, bicarb 29, BUN of 45, serum creatinine of 2.18, GFR 18, nonfasting blood glucose 113, WBC 6.5, hemoglobin 10, platelets of 183.  Lactic acid was 1.5.  High sensitive troponin was mildly elevated at 18.   CT of the head without contrast was read as negative for bleed or other acute intracranial process.  Atrophy and nonspecific white matter changes   Portable chest x-ray: Read as no signs of pulmonary edema or focal pulmonary consolidation.  ED treatment: Lactated ringer 1 L bolus, sodium chloride 250 mL one-time dose.   At bedside, he is able to tell me his name, age, current location, and current calendar year.    He states he was seen at his pain management clinic for chronic bilateral shoulder pains. He reports he has gotten shoulder injections for his shoulders. He has been taking tylenol at home for his shoulder pains.   He denies fever, fever, chest pain, abdominal pain, dysuria, shortness of breath.    He reports diarrhea for the last week. He reports the diarrhea is light in color to dark brown and watery.  He states the diarrhea happens every day and usually in the morning.  He states he has not had his bowel movement yet today.  He denies recent antibiotic use.   He endorses compliance with his home Eliquis medication.   Patient just arrived at Dr. Holley Raring clinic's and they just arrived. He 'zoned' out. He was not responding to questions by the clinic staff. RN asked him to squeeze her hands and he did not do it. He was asked to  raise her arms and he was not able to do it at all. He appeared more pale than normal.    He went 'limp', 'motionless'. He did not hit his head. He was sitting at the time. Daughter denies abnormal body movements. She denies urinary and/or bowel incontinence. Daughter states his symptoms improved after about 5 minutes.  Hospital Course:   Assessment and Plan: * Pre-syncope Patient had  presented with presyncopal versus syncopal episode.   Query secondary to hypovolemia in setting of diarrhea for 1 week. -Also likely secondary to orthostatic hypotension. -On presentation noted to have systolic blood pressures in the 60s with blood pressure of 65/48. - C. difficile PCR negative, GI pathogen panel negative, blood cultures with no growth to date, urine cultures pending.  SARS coronavirus 2 PCR negative.  -2D echo done with normal EF, grade 1 diastolic dysfunction, no significant valvular abnormalities noted.  -EEG done was normal.  -Blood pressure improved with fluid boluses.  -IV fluids saline lock, however noted to have systolic blood pressures in the 100s and symptomatic with lightheadedness on 09/16/2021, and as such placed back on gentle hydration. -TED hose -Unable to obtain MRI. -Patient monitored on telemetry no significant arrhythmias noted. -Patient resumed back on half home regimen of Lopressor 12.5 mg twice daily for atrial fibrillation. -Orthostatics were checked on discharge which were positive however patient remained asymptomatic. -The rest of patient's antihypertensive medications were held during the hospitalization. -Patient will be discharged home on Lopressor 12.5 mg twice daily, to resume half home dose of torsemide 20 mg daily in 3 to 4 days if continued clinical improvement. -Outpatient follow-up with PCP.  Hypotension - Patient noted to be hypotensive on presentation felt likely etiology of patient's presyncope versus syncopal episode. -Patient noted to be orthostatic -Felt likely secondary to hypovolemia in the setting of diuretics and antihypertensive medications. -Infectious work-up to date negative. -Urine cultures pending with no growth to date by day of discharge.  Patient remained afebrile.  -Blood cultures with no growth to date. -C. difficile PCR negative, GI pathogen panel negative. -CT head negative for any acute abnormalities. -Unable to  do MRI brain due to metallic foreign body. -Patient gently hydrated . -Lopressor was resumed at half home dose of 12.5 mg twice daily and patient's other oral antihypertensive medications including diuretics were held throughout the hospitalization.  -Orthostatics were checked on day of discharge which were positive however patient remained asymptomatic. -Patient was placed on TED hose. -Patient was discharged home in stable and improved condition with outpatient follow-up with PCP closely. -Patient was discharged on Lopressor 12.5 mg twice daily due to history of atrial fibrillation, torsemide will be held and may be resumed 3 to 4 days post discharge at a lower dose of 20 mg daily.  Patient's ARB discontinued on discharge. -If patient becomes symptomatic with orthostasis may need to consider placing on midodrine which may be started in the outpatient setting.  Metal foreign body in ear region - MRI of the brain could not be ordered  Diarrhea - GI pathogen panel, C. difficile PCR negative.   -Discontinued enteric precautions. -Patient was placed on Imodium as needed.  GERD (gastroesophageal reflux disease) - Patient maintained on PPI  Chronic pain syndrome - Stable  Paroxysmal A-fib ([place]) - Patient is noted to be on metoprolol tartrate 50 mg p.o. twice daily, Eliquis 2.5 mg p.o. twice daily - Patient noted to be hypotensive on admission, systolic blood pressures in the low 100s on 09/16/2021 and as such  Lopressor resumed at a lower dose of 12.5 mg twice daily.  -Patient maintained on home regimen Eliquis 2.5 mg twice daily.  -Outpatient follow-up with primary cardiologist and PCP.    CKD (chronic kidney disease), stage IV (HCC) - Stable.  Hyperlipidemia - Continued on home regimen simvastatin.    Diabetes mellitus, type 2 (HCC) - Last hemoglobin A1c noted at 6.0 on 05/23/2019. -Repeat hemoglobin A1c 6.0 (09/16/2021). -Patient not on any home oral hypoglycemic agents. -Likely diet  controlled. -CBGs ranging from 91 -110.   -Outpatient follow-up.  AF (paroxysmal atrial fibrillation) ([place]) - See above.  Gout - Patient maintained on home regimen allopurinol 150 mg daily.    Essential (primary) hypertension - Patient takes losartan 25 mg daily, metoprolol succinate 25 mg daily, which have been held on admission due to initial presentation of hypotension - Hydralazine 10 mg p.o. every 6 hours as needed for SBP greater than 180, -Metoprolol was resumed at 12.5 mg twice daily.   -Losartan discontinued on discharge.   -Patient's torsemide may be resumed 3 to 4 days post discharge at a lower dose of 20 mg daily.   -Outpatient follow-up with PCP.          Procedures: EEG 09/15/2021 2D echo 09/15/2021 CT head without contrast 09/14/2021 Chest x-ray 09/14/2021  Consultations: None  Discharge Exam: Vitals:   09/17/21 0833 09/17/21 1213  BP: (!) 145/83   Pulse: 61   Resp:    Temp: 98.5 F (36.9 C)   SpO2: 95% 96%    General: NAD Cardiovascular: Regular rate rhythm no murmurs rubs or gallops.  No JVD.  No lower extremity edema. Respiratory: CTA B.  No wheezes, no crackles, no rhonchi  Discharge Instructions   Discharge Instructions     Diet - low sodium heart healthy   Complete by: As directed    Increase activity slowly   Complete by: As directed    Increase activity slowly   Complete by: As directed       Allergies as of 09/17/2021       Reactions   Codeine    Gabapentin    Other reaction(s): Hallucination   Morphine    Oxycodone    Tramadol         Medication List     STOP taking these medications    aspirin EC 81 MG tablet   etodolac 400 MG tablet Commonly known as: LODINE   gabapentin 100 MG capsule Commonly known as: NEURONTIN   losartan 25 MG tablet Commonly known as: COZAAR   metoprolol succinate 25 MG 24 hr tablet Commonly known as: TOPROL-XL   traMADol 50 MG tablet Commonly known as: ULTRAM       TAKE  these medications    acetaminophen 650 MG CR tablet Commonly known as: TYLENOL Take 1,300 mg by mouth 2 (two) times daily.   allopurinol 300 MG tablet Commonly known as: ZYLOPRIM Take 150 mg by mouth daily.   apixaban 2.5 MG Tabs tablet Commonly known as: ELIQUIS Take by mouth 2 (two) times daily.   ferrous sulfate 325 (65 FE) MG tablet Take 1 tablet (325 mg total) by mouth daily.   finasteride 5 MG tablet Commonly known as: PROSCAR Take 1 tablet (5 mg total) by mouth daily.   Fish Oil 1000 MG Caps Take 1 capsule by mouth daily.   guaiFENesin 600 MG 12 hr tablet Commonly known as: MUCINEX Take 2 tablets (1,200 mg total) by mouth 2 (two) times daily as needed.  ipratropium 0.03 % nasal spray Commonly known as: ATROVENT Place 2 sprays into both nostrils 3 (three) times daily.   loperamide 2 MG capsule Commonly known as: IMODIUM Take 1 capsule (2 mg total) by mouth as needed for diarrhea or loose stools.   loratadine 10 MG tablet Commonly known as: CLARITIN Take 10 mg by mouth daily as needed.   metoprolol tartrate 25 MG tablet Commonly known as: LOPRESSOR Take 0.5 tablets (12.5 mg total) by mouth 2 (two) times daily. What changed:  medication strength how much to take   Multi-Vitamins Tabs Take 1 tablet by mouth daily.   mupirocin ointment 2 % Commonly known as: BACTROBAN Apply topically 3 (three) times daily.   pantoprazole 40 MG tablet Commonly known as: PROTONIX Take 40 mg by mouth daily.   sertraline 50 MG tablet Commonly known as: ZOLOFT Take 1 tablet by mouth daily.   simvastatin 40 MG tablet Commonly known as: ZOCOR Take 40 mg by mouth daily at 6 PM.   tamsulosin 0.4 MG Caps capsule Commonly known as: FLOMAX Take 1 capsule (0.4 mg total) by mouth daily. Start taking on: September 20, 2021 What changed: These instructions start on September 20, 2021. If you are unsure what to do until then, ask your doctor or other care provider.   torsemide 20 MG  tablet Commonly known as: DEMADEX Take 1 tablet (20 mg total) by mouth daily. Start taking on: September 19, 2021 What changed:  how much to take These instructions start on September 19, 2021. If you are unsure what to do until then, ask your doctor or other care provider.       Allergies  Allergen Reactions   Codeine    Gabapentin     Other reaction(s): Hallucination   Morphine    Oxycodone    Tramadol     Follow-up Information     [name], MD. Go in 1 week(s).   Specialty: Family Medicine Why: Appointment on Monday 09/27/2021 at 11:30am. Contact information: [place] [address] [phone]         Corey Skains, MD. Schedule an appointment as soon as possible for a visit in 2 week(s).   Specialty: Cardiology Why: Make an appointment for two weeks out. Contact information: [facility] [place] [address] [phone]                  The results of significant diagnostics from this hospitalization (including imaging, microbiology, ancillary and laboratory) are listed below for reference.    Significant Diagnostic Studies: EEG adult  Result Date: 2021-10-12 Derek Jack, MD     10-12-2021  2:53 PM Routine EEG Report Deen Crochet is a 88 y.o. male with a history of presyncope who is undergoing an EEG to evaluate for seizures. Report: This EEG was acquired with electrodes placed according to the International 10-20 electrode system (including Fp1, Fp2, F3, F4, C3, C4, P3, P4, O1, O2, T3, T4, T5, T6, A1, A2, Fz, Cz, Pz). The following electrodes were missing or displaced: none. The occipital dominant rhythm was 9 Hz. This activity is reactive to stimulation. Drowsiness was manifested by background fragmentation; deeper stages of sleep were not identified. There was no focal slowing. There were no interictal epileptiform discharges. There were no  electrographic seizures identified. There was no abnormal response to photic stimulation or hyperventilation. Impression: This EEG was obtained while awake and drowsy and  is normal.   Clinical Correlation: Normal EEGs, however, do not rule out epilepsy. Su Monks, MD [facility] [phone] If 7pm- 7am, please page neurology on call as listed in [place].   ECHOCARDIOGRAM COMPLETE  Result Date: 09/15/2021    ECHOCARDIOGRAM REPORT   Patient Name:   HANS RUSHER Date of Exam: 09/15/2021 Medical Rec #:  [id number]   Height:       67.0 in Accession #:    [id number]  Weight:       200.6 lb Date of Birth:  1926-09-15   BSA:          2.025 m Patient Age:    88 years    BP:           128/65 mmHg Patient Gender: M           HR:           75 bpm. Exam Location:  [facility] Procedure: 2D Echo, Cardiac Doppler and Color Doppler Indications:     Syncope R55  History:         Patient has prior history of Echocardiogram examinations, most                  recent 04/28/2021. CHF, Arrythmias:Atrial Fibrillation; Risk                  Factors:Hypertension and Diabetes.  Sonographer:     Sherrie Sport Referring Phys:  [id number] AMY N COX Diagnosing Phys: Yolonda Kida MD  Sonographer Comments: Technically challenging study due to limited acoustic windows, no apical window and no subcostal window. IMPRESSIONS  1. Left ventricular ejection fraction, by estimation, is 65 to 70%. The left ventricle has normal function. The left ventricle has no regional wall motion abnormalities. There is mild left ventricular hypertrophy. Left ventricular diastolic parameters are consistent with Grade I diastolic dysfunction (impaired relaxation).  2. Right ventricular systolic function is normal. The right ventricular size is normal.  3. The mitral valve is normal in structure. No evidence of mitral valve regurgitation.  4. The aortic valve is normal in structure. Aortic valve regurgitation is not visualized. Conclusion(s)/Recommendation(s): Poor  windows for evaluation of left ventricular function by transthoracic echocardiography. Would recommend an alternative means of evaluation. FINDINGS  Left Ventricle: Left ventricular ejection fraction, by estimation, is 65 to 70%. The left ventricle has normal function. The left ventricle has no regional wall motion abnormalities. The left ventricular internal cavity size was normal in size. There is  mild left ventricular hypertrophy. Left ventricular diastolic parameters are consistent with Grade I diastolic dysfunction (impaired relaxation). Right Ventricle: The right ventricular size is normal. No increase in right ventricular wall thickness. Right ventricular systolic function is normal. Left Atrium: Left atrial size was normal in size. Right Atrium: Right atrial size was normal in size. Pericardium: There is no evidence of pericardial effusion. Mitral Valve: The mitral valve is normal in structure. No evidence of mitral valve regurgitation. Tricuspid Valve: The tricuspid valve is normal in structure. Tricuspid valve regurgitation is trivial. Aortic Valve: The aortic valve is normal in structure. Aortic valve regurgitation is not visualized. Pulmonic Valve: The pulmonic valve was normal in structure. Pulmonic valve regurgitation is not visualized. Aorta: The ascending aorta was not well visualized. IAS/Shunts: No atrial level shunt detected by color flow Doppler.  LEFT VENTRICLE PLAX 2D LVIDd:         4.50 cm LVIDs:         2.80 cm LV PW:  1.60 cm LV IVS:        1.30 cm LVOT diam:     2.00 cm LVOT Area:     3.14 cm  LEFT ATRIUM         Index LA diam:    4.30 cm 2.12 cm/m   AORTA Ao Root diam: 3.27 cm  SHUNTS Systemic Diam: 2.00 cm Yolonda Kida MD Electronically signed by Yolonda Kida MD Signature Date/Time: 09/15/2021/2:25:12 PM    Final    CT Head Wo Contrast  Result Date: 09/14/2021 CLINICAL DATA:  Headache EXAM: CT HEAD WITHOUT CONTRAST TECHNIQUE: Contiguous axial images were obtained  from the base of the skull through the vertex without intravenous contrast. RADIATION DOSE REDUCTION: This exam was performed according to the departmental dose-optimization program which includes automated exposure control, adjustment of the mA and/or kV according to patient size and/or use of iterative reconstruction technique. COMPARISON:  01/28/2020 FINDINGS: Brain: Diffuse parenchymal atrophy. Patchy areas of hypoattenuation in deep and periventricular white matter bilaterally. Stable lacunar infarct in the left basal ganglia. Negative for acute intracranial hemorrhage, mass lesion, acute infarction, midline shift, or mass-effect. Acute infarct may be inapparent on noncontrast CT. Ventricles and sulci symmetric. Vascular: Atherosclerotic and physiologic intracranial calcifications. Skull: Normal. Negative for fracture or focal lesion. Sinuses/Orbits: No acute finding. Other: None IMPRESSION: 1. Negative for bleed or other acute intracranial process. 2. Atrophy and nonspecific white matter changes. Electronically Signed   By: Lucrezia Europe M.D.   On: 09/14/2021 15:14   DG Chest Port 1 View  Result Date: 09/14/2021 CLINICAL DATA:  Possible sepsis EXAM: PORTABLE CHEST 1 VIEW COMPARISON:  Previous studies including the examination of 01/08/2020 FINDINGS: Transverse diameter heart is slightly increased. There is previous coronary bypass surgery. There are no signs of alveolar pulmonary edema or focal pulmonary consolidation. There are calcified nodules in left lower lung field and left hilum. There is no pleural effusion or pneumothorax. Degenerative changes are noted in both shoulders. IMPRESSION: There are no signs of pulmonary edema or focal pulmonary consolidation. Electronically Signed   By: Elmer Picker M.D.   On: 09/14/2021 15:06    Microbiology: Recent Results (from the past 240 hour(s))  Culture, blood (Routine x 2)     Status: None (Preliminary result)   Collection Time: 09/14/21  2:30 PM    Specimen: BLOOD  Result Value Ref Range Status   Specimen Description BLOOD RIGHT ANTECUBITAL  Final   Special Requests   Final    BOTTLES DRAWN AEROBIC AND ANAEROBIC Blood Culture results may not be optimal due to an excessive volume of blood received in culture bottles   Culture   Final    NO GROWTH 3 DAYS Performed at [facility], [address] [place], [place] [address]    Report Status PENDING  Incomplete  Culture, blood (Routine x 2)     Status: None (Preliminary result)   Collection Time: 09/14/21  2:35 PM   Specimen: BLOOD  Result Value Ref Range Status   Specimen Description BLOOD LEFT ANTECUBITAL  Final   Special Requests   Final    BOTTLES DRAWN AEROBIC AND ANAEROBIC Blood Culture results may not be optimal due to an inadequate volume of blood received in culture bottles   Culture   Final    NO GROWTH 3 DAYS Performed at [facility], [address] [place], [place] [address]    Report Status PENDING  Incomplete  SARS Coronavirus 2 by RT PCR (hospital  order, performed in [facility] hospital lab) *cepheid single result test* Anterior Nasal Swab     Status: None   Collection Time: 09/14/21  6:38 PM   Specimen: Anterior Nasal Swab  Result Value Ref Range Status   SARS Coronavirus 2 by RT PCR NEGATIVE NEGATIVE Final    Comment: (NOTE) SARS-CoV-2 target nucleic acids are NOT DETECTED.  The SARS-CoV-2 RNA is generally detectable in upper and lower respiratory specimens during the acute phase of infection. The lowest concentration of SARS-CoV-2 viral copies this assay can detect is 250 copies / mL. A negative result does not preclude SARS-CoV-2 infection and should not be used as the sole basis for treatment or other patient management decisions.  A negative result may occur with improper specimen collection / handling, submission of specimen other than nasopharyngeal swab, presence of viral mutation(s) within the areas targeted by this assay, and  inadequate number of viral copies (<250 copies / mL). A negative result must be combined with clinical observations, patient history, and epidemiological information.  Fact Sheet for Patients:   https://www.patel.info/  Fact Sheet for Healthcare Providers: https://hall.com/  This test is not yet approved or  cleared by the [place] FDA and has been authorized for detection and/or diagnosis of SARS-CoV-2 by FDA under an Emergency Use Authorization (EUA).  This EUA will remain in effect (meaning this test can be used) for the duration of the COVID-19 declaration under Section 564(b)(1) of the Act, 21 U.S.C. section 360bbb-3(b)(1), unless the authorization is terminated or revoked sooner.  Performed at [facility], [address] [place], [place] [address]   Gastrointestinal Panel by PCR , Stool     Status: None   Collection Time: 09/15/21  9:30 AM   Specimen: Stool  Result Value Ref Range Status   Campylobacter species NOT DETECTED NOT DETECTED Final   Plesimonas shigelloides NOT DETECTED NOT DETECTED Final   Salmonella species NOT DETECTED NOT DETECTED Final   Yersinia enterocolitica NOT DETECTED NOT DETECTED Final   Vibrio species NOT DETECTED NOT DETECTED Final   Vibrio cholerae NOT DETECTED NOT DETECTED Final   Enteroaggregative E coli (EAEC) NOT DETECTED NOT DETECTED Final   Enteropathogenic E coli (EPEC) NOT DETECTED NOT DETECTED Final   Enterotoxigenic E coli (ETEC) NOT DETECTED NOT DETECTED Final   Shiga like toxin producing E coli (STEC) NOT DETECTED NOT DETECTED Final   Shigella/Enteroinvasive E coli (EIEC) NOT DETECTED NOT DETECTED Final   Cryptosporidium NOT DETECTED NOT DETECTED Final   Cyclospora cayetanensis NOT DETECTED NOT DETECTED Final   Entamoeba histolytica NOT DETECTED NOT DETECTED Final   Giardia lamblia NOT DETECTED NOT DETECTED Final   Adenovirus F40/41 NOT DETECTED NOT DETECTED Final    Astrovirus NOT DETECTED NOT DETECTED Final   Norovirus GI/GII NOT DETECTED NOT DETECTED Final   Rotavirus A NOT DETECTED NOT DETECTED Final   Sapovirus (I, II, IV, and V) NOT DETECTED NOT DETECTED Final    Comment: Performed at [facility], [address] [place], [place] [address]  C Difficile Quick Screen w PCR reflex     Status: None   Collection Time: 09/15/21  9:30 AM   Specimen: STOOL  Result Value Ref Range Status   C Diff antigen NEGATIVE NEGATIVE Final   C Diff toxin NEGATIVE NEGATIVE Final   C Diff interpretation No C. difficile detected.  Final    Comment: Performed at [facility], [address] [place], [place] [address]     Labs:  Basic Metabolic Panel: Recent Labs  Lab 09/14/21 1435 09/15/21 0500 09/16/21 0634 09/17/21 0532  NA 143 142 143 142  K 4.3 3.6 3.6 3.7  CL 108 108 113* 115*  CO2 29 27 26 23   GLUCOSE 113* 97 101* 88  BUN 45* 44* 37* 30*  CREATININE 2.78* 2.48* 2.23* 2.03*  CALCIUM 9.4 9.1 9.1 9.2  MG  --   --  2.4  --    Liver Function Tests: Recent Labs  Lab 09/14/21 1435  AST 24  ALT 15  ALKPHOS 63  BILITOT 0.7  PROT 6.3*  ALBUMIN 3.4*   No results for input(s): "LIPASE", "AMYLASE" in the last 168 hours. No results for input(s): "AMMONIA" in the last 168 hours. CBC: Recent Labs  Lab 09/14/21 1435 09/14/21 1838 09/15/21 0500 09/16/21 0634 09/17/21 0532  WBC 6.4 5.8 6.2 6.4 6.0  NEUTROABS 3.7  --   --   --   --   HGB 10.0* 9.1* 9.2* 9.1* 8.9*  HCT 32.1* 29.1* 28.8* 28.1* 27.8*  MCV 101.6* 100.3* 100.3* 99.3 98.6  PLT 183 178 186 185 176   Cardiac Enzymes: No results for input(s): "CKTOTAL", "CKMB", "CKMBINDEX", "TROPONINI" in the last 168 hours. BNP: BNP (last 3 results) No results for input(s): "BNP" in the last 8760 hours.  ProBNP (last 3 results) No results for input(s): "PROBNP" in the last 8760 hours.  CBG: Recent Labs  Lab 09/14/21 1402 09/15/21 0406 09/15/21 2140 09/16/21 0518  09/16/21 0824  GLUCAP 114* 102* 110* 96 91       Signed:  Irine Seal MD.  [facility] 09/17/2021, 4:40 PM

## 2021-09-17 NOTE — TOC Progression Note (Signed)
Transition of Care [facility]) - Progression Note    Patient Details  Name: Carlos [name] MRN: [id number] Date of Birth: Sep 28, 1926  [facility]) CM/SW [place], LCSW Phone Number: 09/17/2021, 11:48 AM  Clinical Narrative:  Patient was discharged from [facility]. No needs identified at this time.  Expected Discharge Plan:  (tbd) Barriers to Discharge: Continued Medical Work up  Expected Discharge Plan and Services Expected Discharge Plan:  (tbd)   Discharge Planning Services: CM Consult Post Acute Care Choice:  ([facility], has stair lift grab bars walkers and urinal at home) Living arrangements for the past 2 months: Single Family Home                           HH Arranged: Nurse's Aide           Social Determinants of Health (SDOH) Interventions    Readmission Risk Interventions     No data to display

## 2021-09-17 NOTE — TOC Transition Note (Signed)
Transition of Care [facility]) - CM/SW Discharge Note   Patient Details  Name: Carlos [name] MRN: [id number] Date of Birth: 02/23/1927  Transition [facility] Care [facility]) CM/SW Contact:  Candie Chroman, LCSW Phone Number: 09/17/2021, 3:01 PM   Clinical Narrative:  Patient has orders to discharge home today. No further concerns. CSW signing off.   Final next level of care: Home/Self Care Barriers to Discharge: Barriers Resolved   Patient Goals and CMS Choice        Discharge Placement                    Patient and family notified of of transfer: 09/17/21  Discharge Plan and Services   Discharge Planning Services: CM Consult Post Acute Care Choice:  ([facility], has stair lift grab bars walkers and urinal at home)                    HH Arranged: Nurse's Aide          Social Determinants of Health (SDOH) Interventions     Readmission Risk Interventions     No data to display

## 2021-09-18 LAB — URINE CULTURE: Culture: >=100000 — AB

## 2021-09-19 LAB — CULTURE, BLOOD (ROUTINE X 2)
Culture: NO GROWTH
Culture: NO GROWTH

## 2021-09-20 ENCOUNTER — Telehealth: Payer: Self-pay | Admitting: Primary Care

## 2021-09-20 NOTE — Telephone Encounter (Signed)
Returned call to daughter. He had syncope in the pain management clinic.He was in hospital and now is better. Has [facility] coming out. Currently have f/u for 11/10/21 but asked her to call back if needed earlier.

## 2021-09-21 IMAGING — CR DG CHEST 2V
1 series · 2 of 2 positions shown · non-contrast
Comparison: Chest x-ray dated December 12, 2013.

CLINICAL DATA: Leg swelling.

EXAM:
CHEST - 2 VIEW

[Series 1: dg chest 2 view · 0.14mm/px · 2 of 2 slices shown]
[im 1/2]
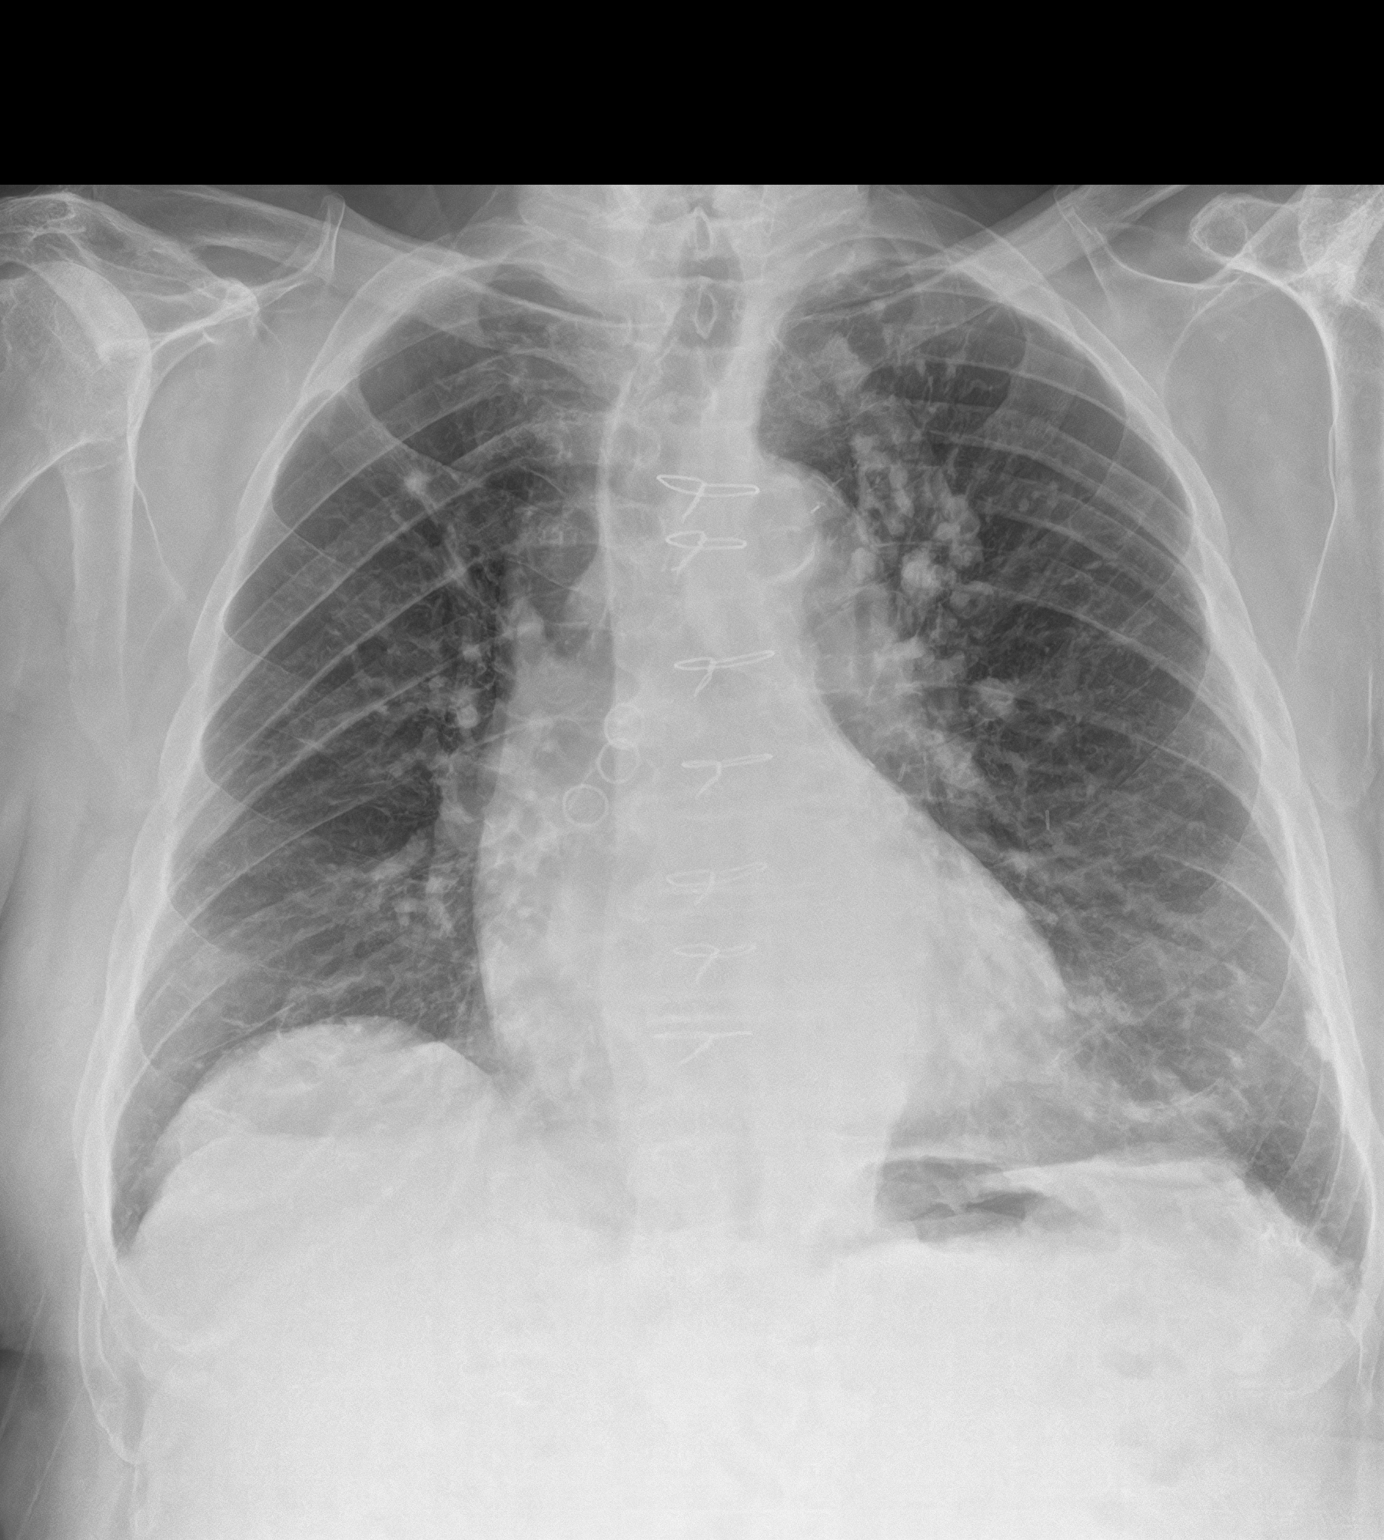
[im 2/2]
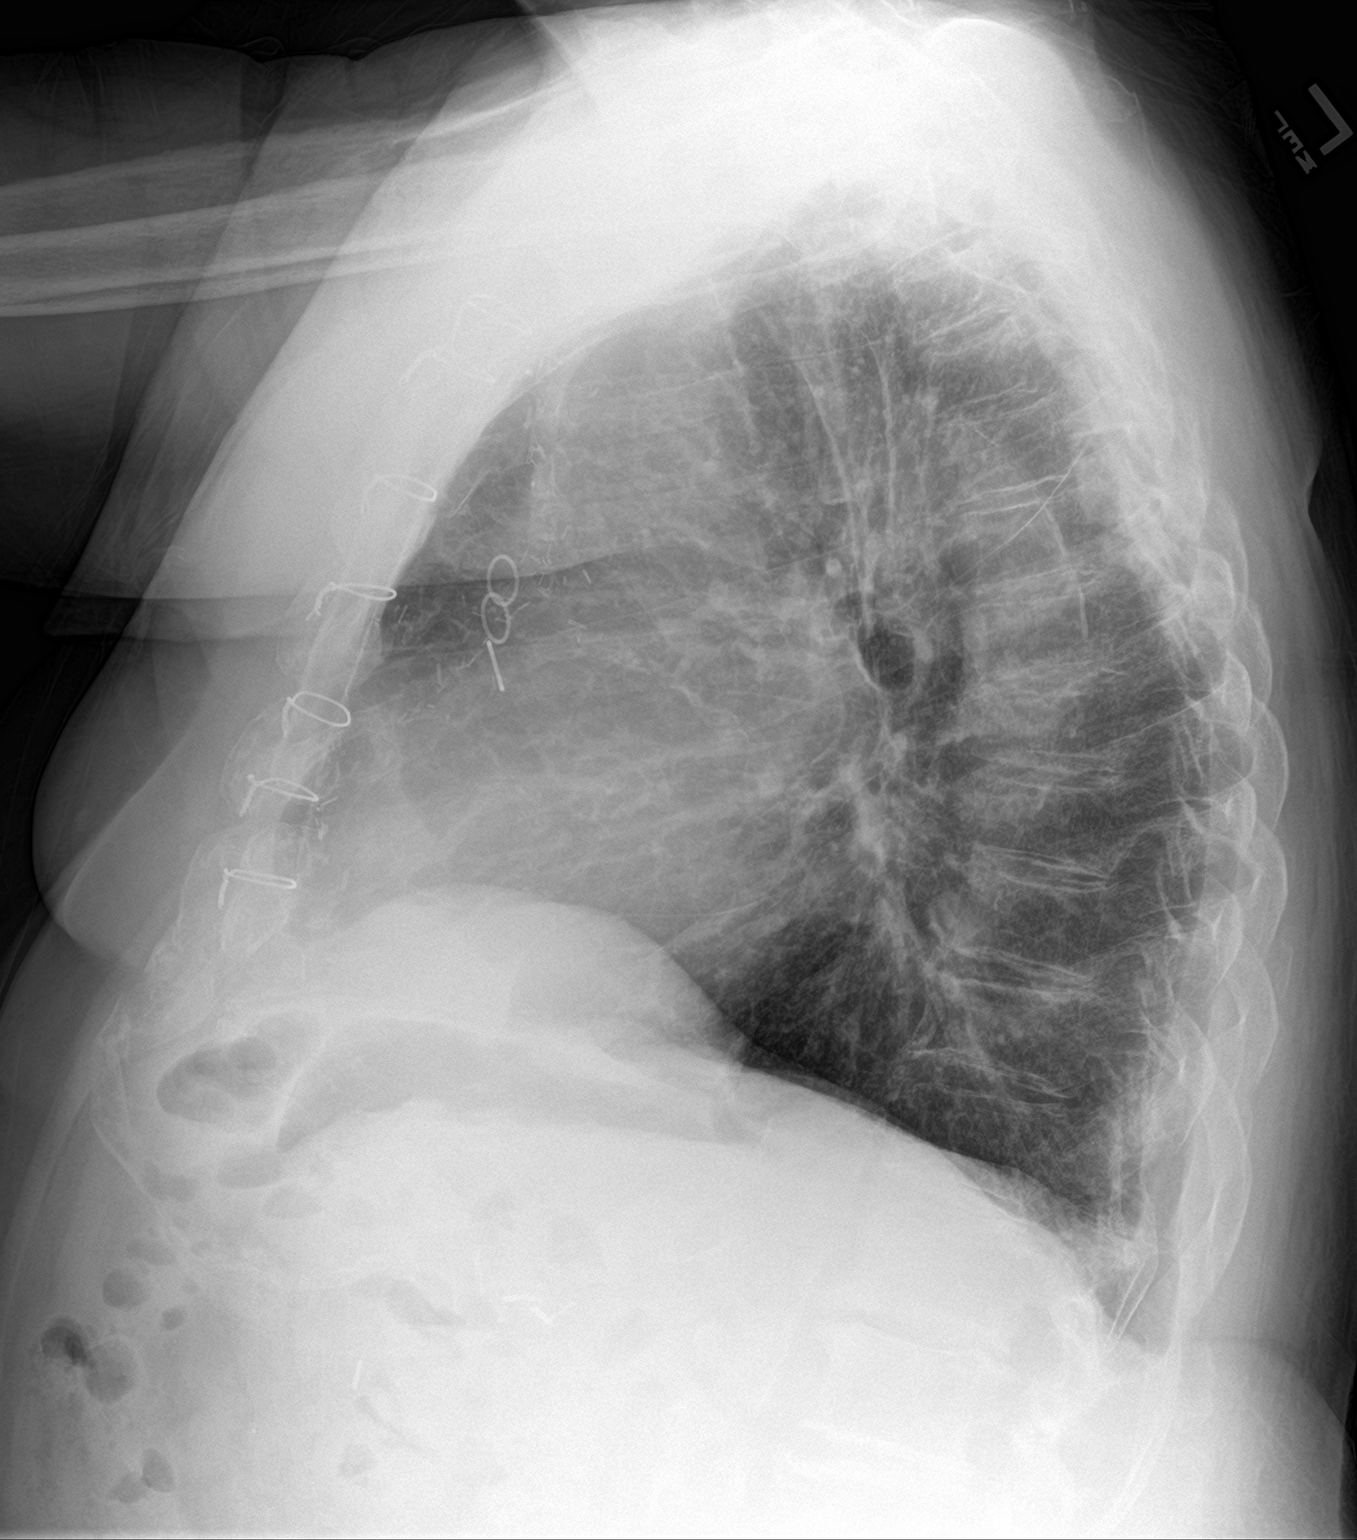

[2 of 2 positions shown; findings below may reference images not displayed]

FINDINGS: Normal heart size status post CABG. Normal mediastinal contours.
Atherosclerotic calcification of the aortic arch. Normal pulmonary
vascularity. Unchanged bilateral calcified granulomas. No focal
consolidation, pleural effusion, or pneumothorax. No acute osseous
abnormality.
IMPRESSION: No active cardiopulmonary disease.

## 2021-09-26 IMAGING — CR DG CHEST 2V
1 series · 2 of 2 positions shown · non-contrast
Comparison: September 05, 2019 and December 12, 2013

CLINICAL DATA: Lower extremity edema

EXAM:
CHEST - 2 VIEW

[Series 1: dg chest 2 view · 0.14mm/px · 2 of 2 slices shown]
[im 1/2]
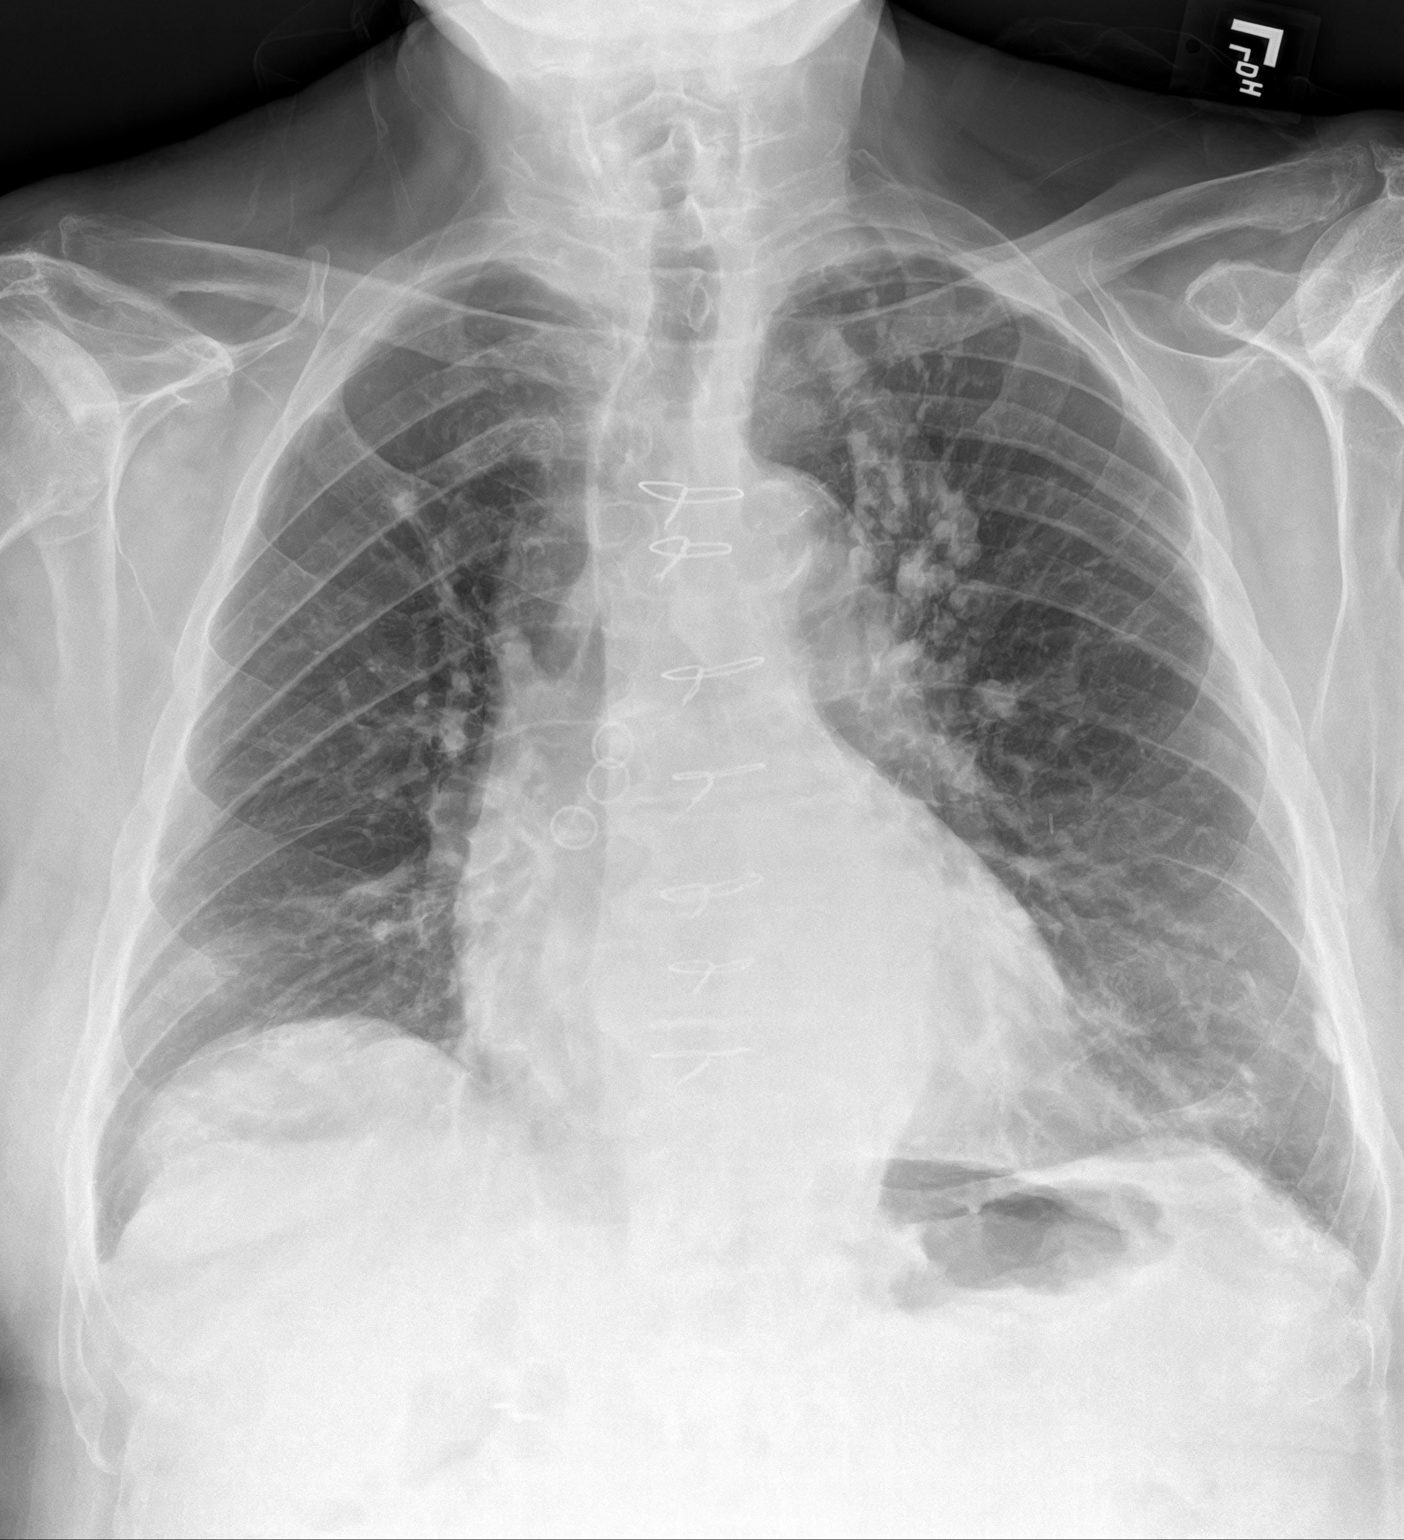
[im 2/2]
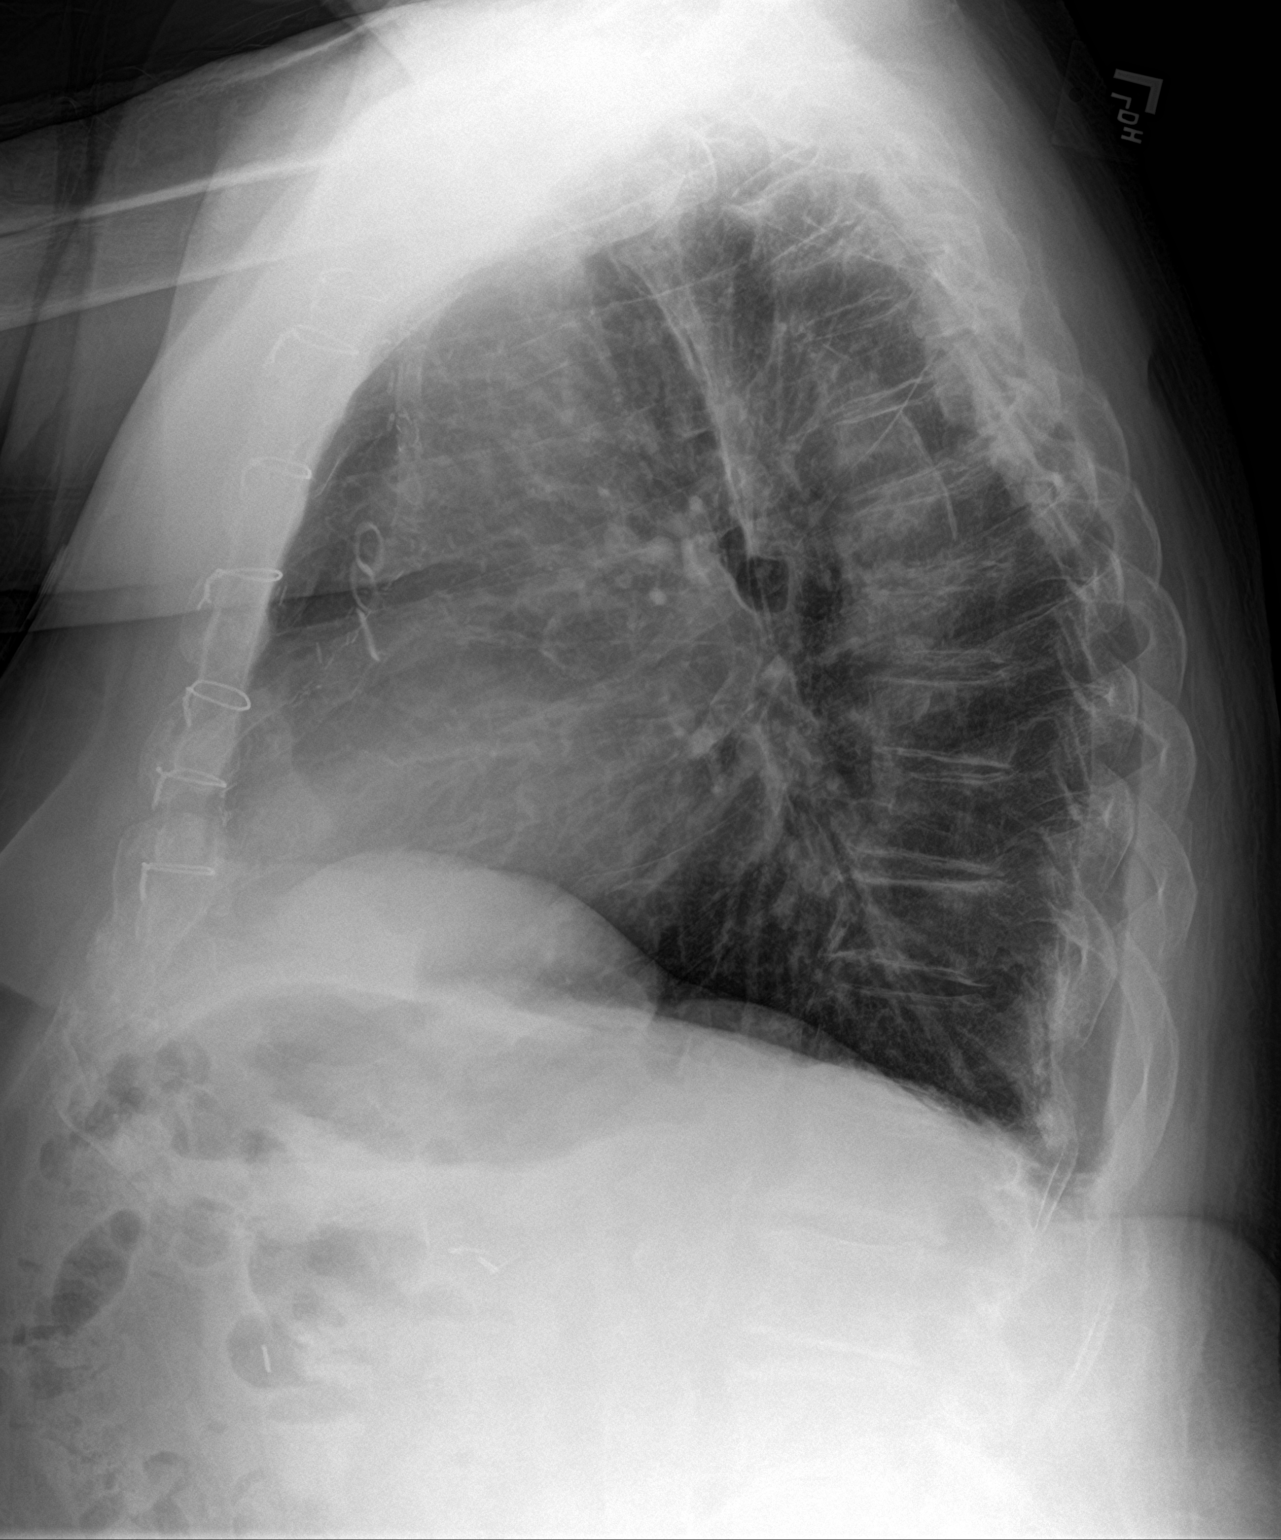

[2 of 2 positions shown; findings below may reference images not displayed]

FINDINGS: There are calcified granulomas bilaterally. There is apparent
pleural calcification in the left upper thorax, unchanged. There is
no frank edema or airspace opacity. Heart size is upper normal with
pulmonary vascularity normal. Patient is status post coronary artery
bypass grafting. There is aortic atherosclerosis. No adenopathy. No
bone lesions.
IMPRESSION: Calcified granulomas. Areas of pleural plaque. Suspect previous
asbestos exposure. No edema or airspace opacity. Heart upper normal
in size. Status post coronary artery bypass grafting.

Aortic Atherosclerosis ([id number].T).

## 2021-10-02 ENCOUNTER — Ambulatory Visit
Admission: RE | Admit: 2021-10-02 | Discharge: 2021-10-02 | Disposition: A | Discharge status: Home / Self Care | Payer: Medicare Other | Source: Ambulatory Visit | Attending: Emergency Medicine | Admitting: Emergency Medicine

## 2021-10-02 VITALS — BP 106/55 | HR 85 | Temp 98.2°F | Resp 18 | Ht 67.0 in | Wt 205.0 lb

## 2021-10-02 DIAGNOSIS — L519 Erythema multiforme, unspecified: Secondary | ICD-10-CM

## 2021-10-02 MED ORDER — TRIAMCINOLONE ACETONIDE 0.1 % EX CREA: 1.0000 | TOPICAL_CREAM | Freq: Two times a day (BID) | CUTANEOUS | 0 refills | Status: DC

## 2021-10-02 NOTE — ED Provider Notes (Signed)
[facility]    CSN: [id number] Arrival date & time: 10/02/21  1352      History   Chief Complaint Chief Complaint  Patient presents with   Rash    HPI Carlos [name] is a 88 y.o. male.   HPI  88 year old male here for evaluation of skin rash.  Patient is here with his daughter for evaluation of an itchy red rash that appeared on the left side of his chest and posterior neck 2 days ago.  The lesions are round, red, and have blisters in the center.  They are only on the left side of the chest and in the middle of the neck.  He denies any burning sensation.  He is not on any new medications.  He was recently in the hospital 2 weeks ago for syncope, collapse, and atrial fibrillation.  He states that they actually took him off medications but he has not started any new ones.  Past Medical History:  Diagnosis Date   Arthritis    Atrial fibrillation ([place])    CHF (congestive heart failure) ([place])    Chronic kidney disease    kidney stones   Coronary artery disease    Diabetes mellitus without complication (HCC)    Diverticulosis    GERD (gastroesophageal reflux disease)    Gout    Hyperlipidemia    Hypertension    Myocardial infarction ([place]) 2001   Peripheral vascular disease ([place])    Pneumothorax, left 1950    Patient Active Problem List   Diagnosis Date Noted   Hypotension    Syncope    Pre-syncope 09/14/2021   GERD (gastroesophageal reflux disease) 09/14/2021   Diarrhea 09/14/2021   Metal foreign body in ear region 09/14/2021   Bilateral rotator cuff dysfunction 03/17/2021   History of rotator cuff surgery 03/17/2021   Lumbar facet arthropathy 03/17/2021   Lumbar degenerative disc disease 03/17/2021   Chronic pain syndrome 03/17/2021   Anemia of chronic disease 09/10/2019   Edema of both lower legs due to peripheral venous insufficiency 09/10/2019   Edema of both lower extremities due to peripheral venous insufficiency 09/10/2019   Acute on chronic  combined systolic and diastolic CHF (congestive heart failure) ([place]) 05/23/2019   Acute pain of left shoulder 09/08/2015   CKD (chronic kidney disease), stage IV ([place]) 12/24/2014   Bradycardia 12/02/2014   TI (tricuspid incompetence) 10/21/2014   DD (diverticular disease) 07/30/2014   Essential (primary) hypertension 07/30/2014   Tuberculosis 07/30/2014   Hyperlipidemia 23/55/7322   Chronic systolic CHF (congestive heart failure), NYHA class 2 ([place]) 02/12/2014   AF (paroxysmal atrial fibrillation) ([place]) 02/12/2014   Paroxysmal A-fib ([place]) 02/12/2014   Accumulation of fluid in tissues 07/24/2013   Gout 07/24/2013   Diabetes mellitus, type 2 ([place]) 07/24/2013   Type 2 diabetes mellitus with renal manifestations, controlled ([place]) 07/24/2013   Peripheral blood vessel disorder ([place]) 07/19/2013   PVD (peripheral vascular disease) ([place]) 07/19/2013   Combined fat and carbohydrate induced hyperlipemia 05/21/2013   Encounter for therapeutic drug monitoring 12/18/2012   Chronic kidney disease (CKD), stage IV (severe) ([place]) 12/18/2012   Arteriosclerosis of coronary artery 12/18/2012   Decreased potassium in the blood 12/18/2012   H/O coronary artery bypass surgery 12/18/2012   H/O total knee replacement 12/18/2012   Encounter for therapeutic drug level monitoring 12/18/2012    Past Surgical History:  Procedure Laterality Date   CARDIAC CATHETERIZATION     CARDIAC SURGERY     4 bypass   CHOLECYSTECTOMY  CORONARY ARTERY BYPASS GRAFT     CYSTOSCOPY W/ RETROGRADES N/A 12/03/2014   Procedure: CYSTOSCOPY WITH ATTEMPT FOR RETROGRADE PYELOGRAM;  Surgeon: [name], MD;  Location: [facility] ORS;  Service: Urology;  Laterality: N/A;   CYSTOSCOPY WITH LITHOLAPAXY N/A 12/03/2014   Procedure: CYSTOSCOPY WITH LITHOLAPAXY WITH HOLMIUM LASER ;  Surgeon: [name], MD;  Location: [facility] ORS;  Service: Urology;  Laterality: N/A;   FEMUR SURGERY     GALLBLADDER SURGERY     HERNIA REPAIR      JOINT REPLACEMENT Right    Total Knee Replacement   LUNG SURGERY Left    REPLACEMENT TOTAL KNEE         Home Medications    Prior to Admission medications   Medication Sig Start Date End Date Taking? Authorizing Provider  acetaminophen (TYLENOL) 650 MG CR tablet Take 1,300 mg by mouth 2 (two) times daily.   Yes [provider]  allopurinol (ZYLOPRIM) 300 MG tablet Take 150 mg by mouth daily.  05/09/14  Yes [provider]  apixaban (ELIQUIS) 2.5 MG TABS tablet Take by mouth 2 (two) times daily.   Yes [provider]  finasteride (PROSCAR) 5 MG tablet Take 1 tablet (5 mg total) by mouth daily. 08/13/18  Yes Billey Co, MD  guaiFENesin (MUCINEX) 600 MG 12 hr tablet Take 2 tablets (1,200 mg total) by mouth 2 (two) times daily as needed. 09/17/21  Yes Eugenie Filler, MD  ipratropium (ATROVENT) 0.03 % nasal spray Place 2 sprays into both nostrils 3 (three) times daily. 07/19/21  Yes [provider]  loperamide (IMODIUM) 2 MG capsule Take 1 capsule (2 mg total) by mouth as needed for diarrhea or loose stools. 09/17/21  Yes Eugenie Filler, MD  loratadine (CLARITIN) 10 MG tablet Take 10 mg by mouth daily as needed. 07/16/19  Yes [provider]  metoprolol tartrate (LOPRESSOR) 25 MG tablet Take 0.5 tablets (12.5 mg total) by mouth 2 (two) times daily. 09/17/21  Yes Eugenie Filler, MD  Multiple Vitamin (MULTI-VITAMINS) TABS Take 1 tablet by mouth daily.    Yes [provider]  mupirocin ointment (BACTROBAN) 2 % Apply topically 3 (three) times daily. 07/19/21  Yes [provider]  pantoprazole (PROTONIX) 40 MG tablet Take 40 mg by mouth daily.   Yes [provider]  sertraline (ZOLOFT) 50 MG tablet Take 1 tablet by mouth daily. 06/17/21  Yes [provider]  simvastatin (ZOCOR) 40 MG tablet Take 40 mg by mouth daily at 6 PM.  07/07/14  Yes [provider]  tamsulosin (FLOMAX) 0.4 MG CAPS capsule Take 1  capsule (0.4 mg total) by mouth daily. 09/20/21  Yes Eugenie Filler, MD  torsemide (DEMADEX) 20 MG tablet Take 1 tablet (20 mg total) by mouth daily. 09/19/21 10/19/21 Yes Eugenie Filler, MD  triamcinolone cream (KENALOG) 0.1 % Apply 1 Application topically 2 (two) times daily. 10/02/21  Yes Margarette Canada, NP  ferrous sulfate 325 (65 FE) MG tablet Take 1 tablet (325 mg total) by mouth daily. 09/12/19 11/29/19  Sharen Hones, MD  Omega-3 Fatty Acids (FISH OIL) 1000 MG CAPS Take 1 capsule by mouth daily. Patient not taking: Reported on 03/17/2021    [provider]  colchicine 0.6 MG tablet Take 0.6 mg by mouth as needed.   08/28/18  [provider]    Family History Family History  Problem Relation Age of Onset   Lung cancer Mother    Tuberculosis  Brother     Social History Social History   Tobacco Use   Smoking status: Former    Types: Pipe    Quit date: 07/30/2006    Years since quitting: 15.1   Smokeless tobacco: Never  Vaping Use   Vaping Use: Never used  Substance Use Topics   Alcohol use: Yes    Alcohol/week: 14.0 standard drinks of alcohol    Types: 14 Glasses of wine per week    Comment: one glass of wine daily   Drug use: No     Allergies   Codeine, Gabapentin, Morphine, Oxycodone, and Tramadol   Review of Systems Review of Systems  Skin:  Positive for color change and rash.     Physical Exam Triage Vital Signs ED Triage Vitals  Enc Vitals Group     BP      Pulse      Resp      Temp      Temp src      SpO2      Weight      Height      Head Circumference      Peak Flow      Pain Score      Pain Loc      Pain Edu?      Excl. in [place]?    No data found.  Updated Vital Signs BP (!) 106/55 (BP Location: Left Arm)   Pulse 85   Temp 98.2 F (36.8 C) (Oral)   Resp 18   Ht 5\' 7"  (1.702 m)   Wt 205 lb (93 kg)   SpO2 94%   BMI 32.11 kg/m   Visual Acuity Right Eye Distance:   Left Eye Distance:   Bilateral Distance:    Right  Eye Near:   Left Eye Near:    Bilateral Near:     Physical Exam Vitals and nursing note reviewed.  Constitutional:      Appearance: Normal appearance. He is not ill-appearing.  HENT:     Head: Normocephalic and atraumatic.  Skin:    General: Skin is warm and dry.     Capillary Refill: Capillary refill takes less than 2 seconds.     Findings: Erythema and rash present.  Neurological:     General: No focal deficit present.     Mental Status: He is alert and oriented to person, place, and time.  Psychiatric:        Mood and Affect: Mood normal.        Behavior: Behavior normal.        Thought Content: Thought content normal.        Judgment: Judgment normal.           UC Treatments / Results  Labs (all labs ordered are listed, but only abnormal results are displayed) Labs Reviewed - No data to display  EKG   Radiology No results found.  Procedures Procedures (including critical care time)  Medications Ordered in UC Medications - No data to display  Initial Impression / Assessment and Plan / UC Course  I have reviewed the triage vital signs and the nursing notes.  Pertinent labs & imaging results that were available during my care of the patient were reviewed by me and considered in my medical decision making (see chart for details).  Patient is a very pleasant, nontoxic-appearing 88 year old male here for evaluation of a red rash on the left side of the chest and posterior neck as outlined HPI  above.  On exam patient has erythematous, targetoid lesions, some with central blistering on the left side the chest and posterior neck.  There are no blisters on the posterior neck.  The rash is warm but not hot to touch.  There is no induration or fluctuance.  The rash is blanchable.  The rash appears to be erythema multiforme.  I will treat the patient with topical triamcinolone to help with itching.  He is already taking loratadine during the day for allergies and I have  advised him and his daughter that he can take Benadryl at nighttime if needed for the itching.  This is a usually a self-limiting condition which resolves in 3 to 5 weeks.  If there is increased redness, swelling, drainage, or fever then patient should present to the ER for evaluation.   Final Clinical Impressions(s) / UC Diagnoses   Final diagnoses:  Erythema multiforme     Discharge Instructions      Apply the triamcinolone cream twice daily to your lesions to help with itching.  Continue your loratadine during the day to help with itching and you can use Benadryl 50 mg at bedtime as needed for itching.  This condition is typically self-limiting and resolve in 3-5 weeks.  If you develop increased redness, drainage or fever that would prompt an ER visit.     ED Prescriptions     Medication Sig Dispense Auth. Provider   triamcinolone cream (KENALOG) 0.1 % Apply 1 Application topically 2 (two) times daily. 30 g Margarette Canada, NP      PDMP not reviewed this encounter.   Margarette Canada, NP 10/02/21 1431

## 2021-10-02 NOTE — ED Triage Notes (Signed)
Pt c/o rash along neck and chest x2days  Pt states that there is no pain, only itching.   Pt states that he recently left the hospital 2 Fridays ago.  Pt states that the skin is raised and blistered or has holes along the skin like bites.   Pt daughter used hydrocortisone cream on his chest and states that it has gotten worse.

## 2021-10-02 NOTE — Discharge Instructions (Signed)
Apply the triamcinolone cream twice daily to your lesions to help with itching.  Continue your loratadine during the day to help with itching and you can use Benadryl 50 mg at bedtime as needed for itching.  This condition is typically self-limiting and resolve in 3-5 weeks.  If you develop increased redness, drainage or fever that would prompt an ER visit.

## 2021-11-10 ENCOUNTER — Other Ambulatory Visit: Payer: Medicare Other | Admitting: Primary Care

## 2021-11-10 DIAGNOSIS — I872 Venous insufficiency (chronic) (peripheral): Secondary | ICD-10-CM

## 2021-11-10 DIAGNOSIS — R600 Localized edema: Secondary | ICD-10-CM

## 2021-11-10 DIAGNOSIS — G894 Chronic pain syndrome: Secondary | ICD-10-CM

## 2021-11-10 NOTE — Progress Notes (Signed)
[facility] Palliative Care Consult Note Telephone: [phone]  Fax: [phone]    Date of encounter: 11/10/21 10:26 AM PATIENT NAME: Carlos [name] [place] [address]   [phone] (home) [phone] (work) DOB: [birth year] MRN: [id number] PRIMARY CARE PROVIDER:    [name], MD,  [facility] [place] [address] [phone]  REFERRING PROVIDER:   [name], [place],  [place] [address] [phone]  RESPONSIBLE PARTY:    Contact Information     Name Relation Home Work [place] Daughter [phone]  [phone]   Kalman Drape [phone]          I met face to face with patient and family in  home. Palliative Care was asked to follow this patient by consultation request of  Carlos [name], Carlos Noa, MD to address advance care planning and complex medical decision making. This is a follow up visit.                                   ASSESSMENT AND PLAN / RECOMMENDATIONS:   Advance Care Planning/Goals of Care: Goals include to maximize quality of life and symptom management. Patient/health care surrogate gave his/her permission to discuss.Our advance care planning conversation included a discussion about:    The value and importance of advance care planning  Experiences with loved ones who have been seriously ill or have died  Exploration of personal, cultural or spiritual beliefs that might influence medical decisions  Exploration of goals of care in the event of a sudden injury or illness  Identification of a healthcare agent - Carlos [name] States "no heroics" Review  of an advance directive document . CODE STATUS: states DNR, not viewed in home.  Symptom Management/Plan:   I met with patient in his home. His daughter has now moved in with him and is helping him with ADLs. He also is hard of hearing and not wearing hearing aids, which is a point of contention. He  had an episode at the ER with torso rash, said to be due to EKG leads weeks after the fact,  appeared more like insect bites, but these have resolved. We discussed the advance care planning. Daughter states that this is already done, but does not have paperwork and is not on file in [place]. She does state no heroics. I asked her to please find the most form and we will complete it next time.   Edema: L < R, 1-2+.  Keeps elevated, does not like compression. Continue to monitor for bp, edema.  Dyspnea: has DOE, none at rest.  Daughter endorses he can only walk 10 ' without stopping to rest.  Pain: Seen by pain management. 2 ablations were not successful. Had low bp. Has hallucinations with opioids and currently on gabapentin. Feels acetaminophen does nothing and not willing to do CR version.  Follow up Palliative Care Visit: Palliative care will continue to follow for complex medical decision making, advance care planning, and clarification of goals. Return 8 weeks or prn.  I spent 40 minutes providing this consultation. More than 50% of the time in this consultation was spent in counseling and care coordination.  PPS: 40%  HOSPICE ELIGIBILITY/DIAGNOSIS: TBD  Chief Complaint: debility  HISTORY OF PRESENT ILLNESS:  Carlos [name] is a 88 y.o. year old male  with debility, dyspnea on exertion, CHF . Patient seen today  to review palliative care needs to include medical decision making and advance care planning as appropriate.   History obtained from review of EMR, discussion with primary team, and interview with family, facility staff/caregiver and/or Carlos [name].  I reviewed available labs, medications, imaging, studies and related documents from the EMR.  Records reviewed and summarized above.   ROS   General: NAD EYES: denies vision changes ENMT: denies dysphagia Cardiovascular: denies chest pain, Endorses DOE Pulmonary:endorses occ  cough, Endorses increased SOB Abdomen: endorses good  appetite, denies constipation, endorses continence of bowel GU: denies dysuria, endorses continence of urine MSK:  Endorses increased weakness,  no falls reported Skin: denies rashes or wounds Neurological: Endorses pain, denies insomnia Psych: Endorses positive mood  Physical Exam: Current and past weights: stable  Constitutional: NAD General: frail appearing EYES: anicteric sclera, lids intact, no discharge  ENMT: very hard of  hearing, oral mucous membranes moist, dentition intact CV: 1-2+ LE edema Pulmonary: no increased work of breathing, no cough, room air Abdomen: intake 80%,  no ascites MSK: mild  sarcopenia, moves all extremities, ambulatory with walker Skin: warm and dry, no rashes or wounds on visible skin Neuro:  + generalized weakness,  mild  cognitive impairment, non-anxious affect   Thank you for the opportunity to participate in the care of Carlos [name].  The palliative care team will continue to follow. Please call our office at [phone] if we can be of additional assistance.   Jason Coop, NP DNP, AGPCNP-BC  COVID-19 PATIENT SCREENING TOOL Asked and negative response unless otherwise noted:   Have you had symptoms of covid, tested positive or been in contact with someone with symptoms/positive test in the past 5-10 days?

## 2021-11-29 IMAGING — US US EXTREM LOW VENOUS*R*
1 series · 13 of 24 positions shown · non-contrast
Comparison: None.

CLINICAL DATA: [AGE] male with a history of swelling right
lower extremity



[Series 1: us extrem low venous*right* · 0.09mm/px · 13 of 27 slices shown]
[im 1/27]
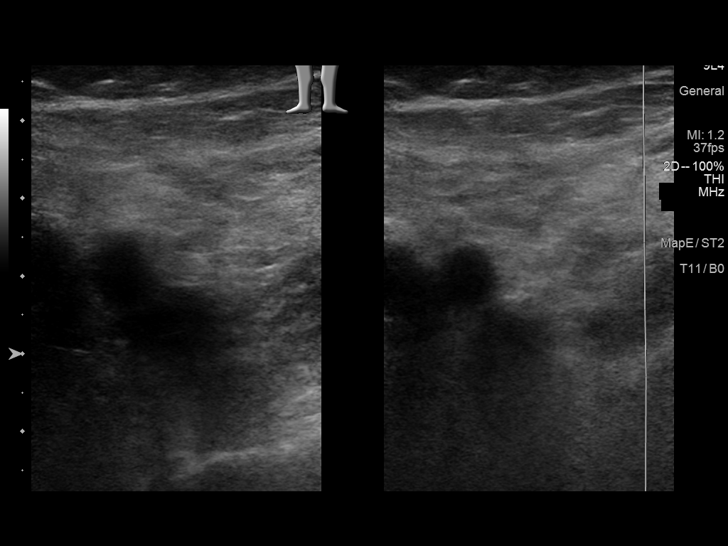
[im 3/27]
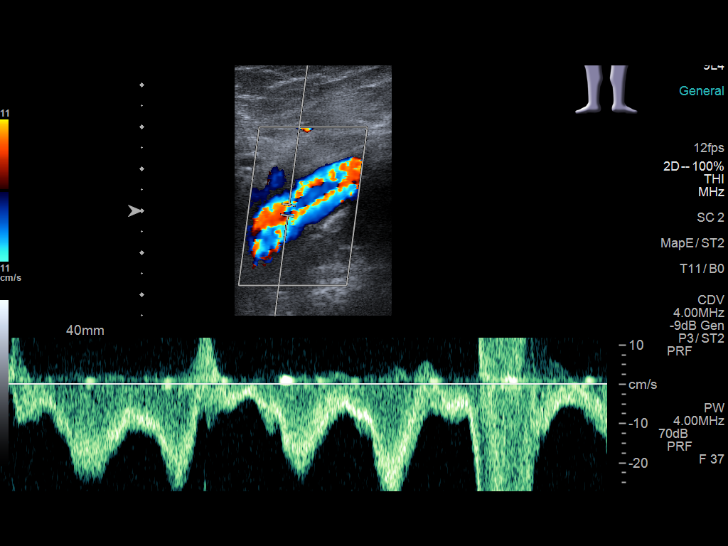
[im 5/27]
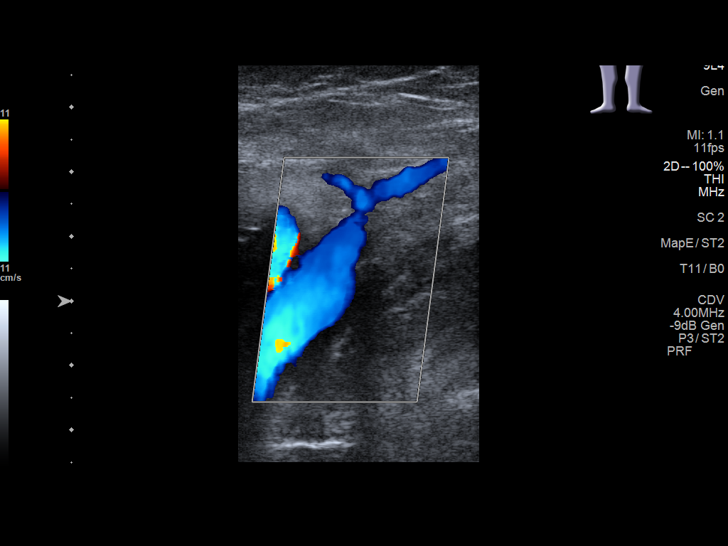
[im 7/27]
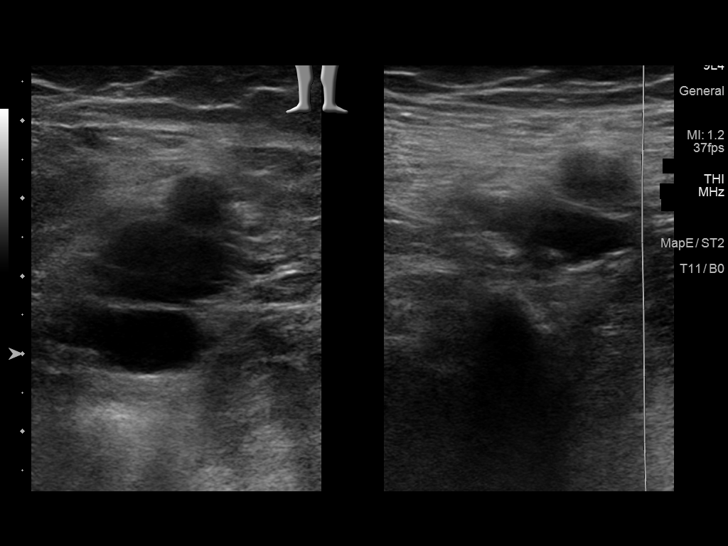
[im 10/27]
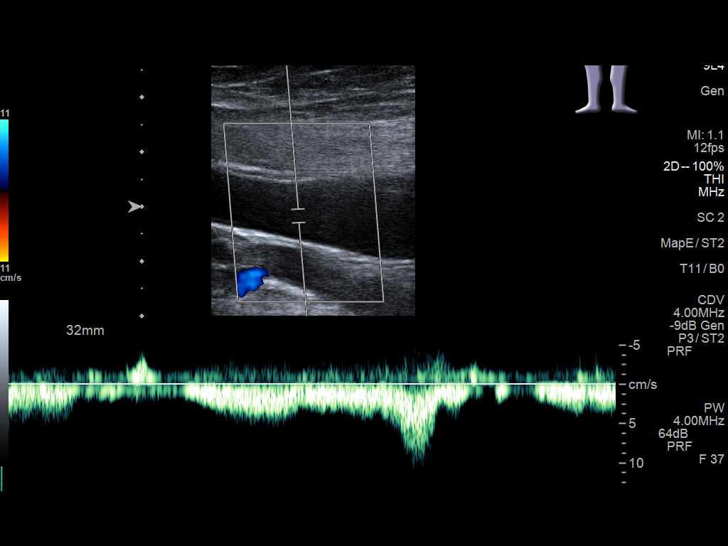
[im 12/27]
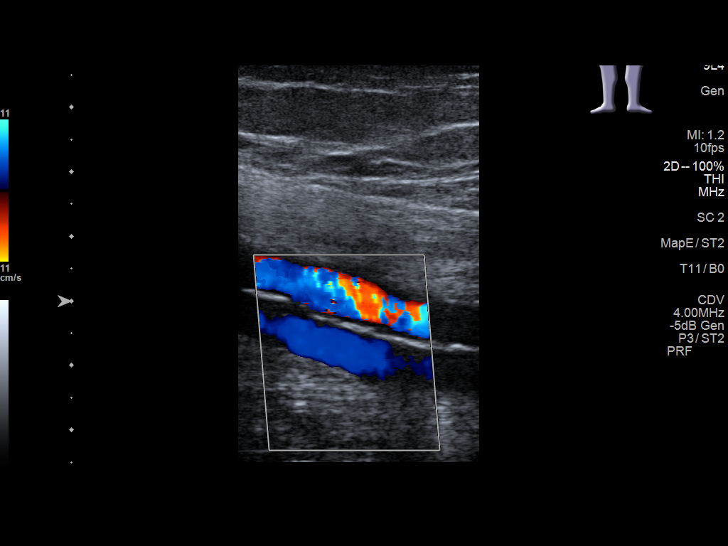
[im 14/27]
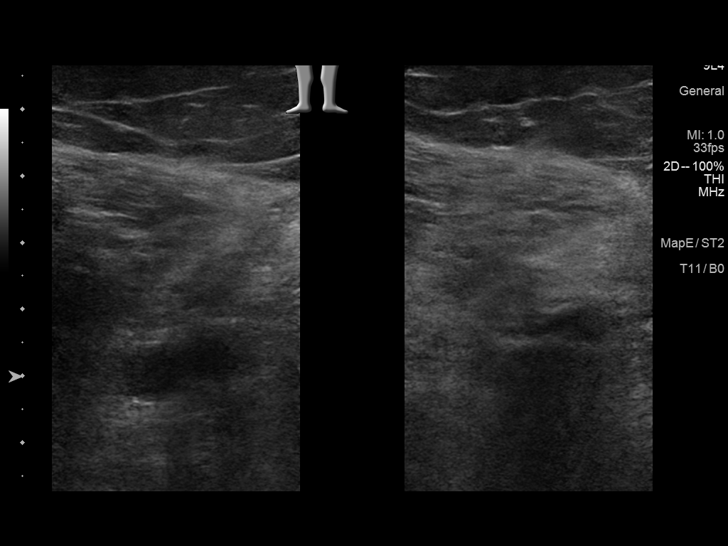
[im 15/27]
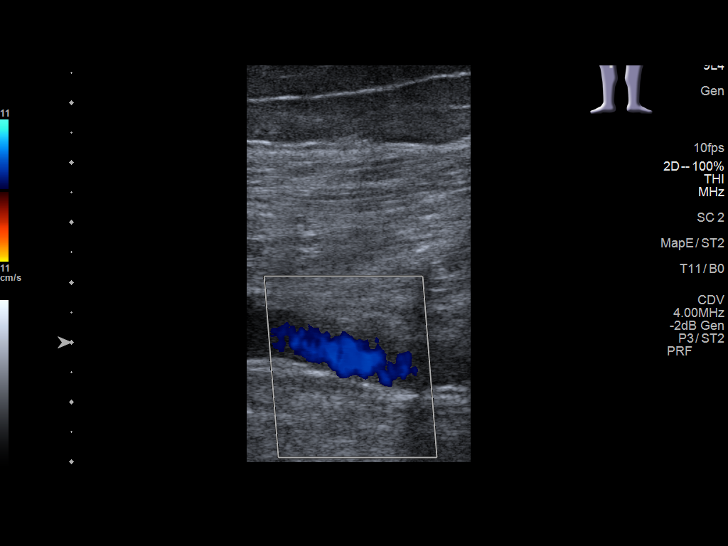
[im 17/27]
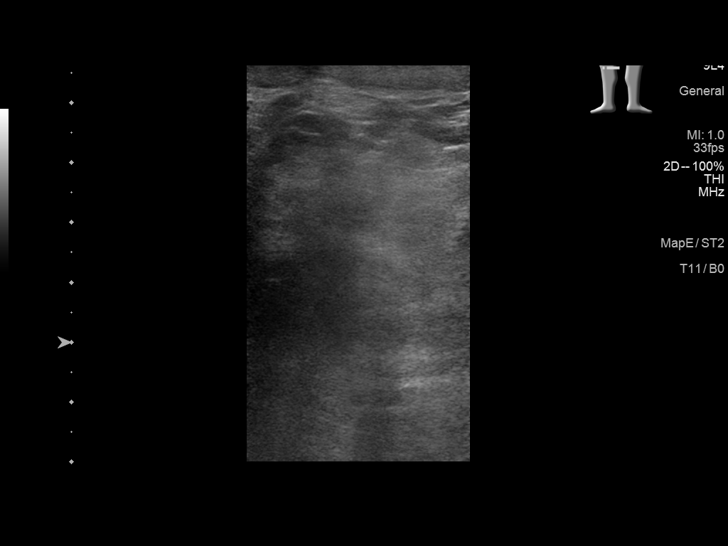
[im 20/27]
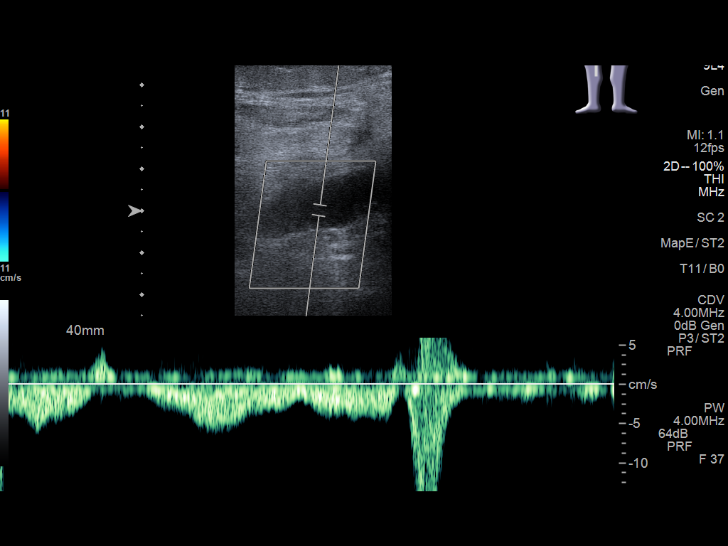
[im 22/27]
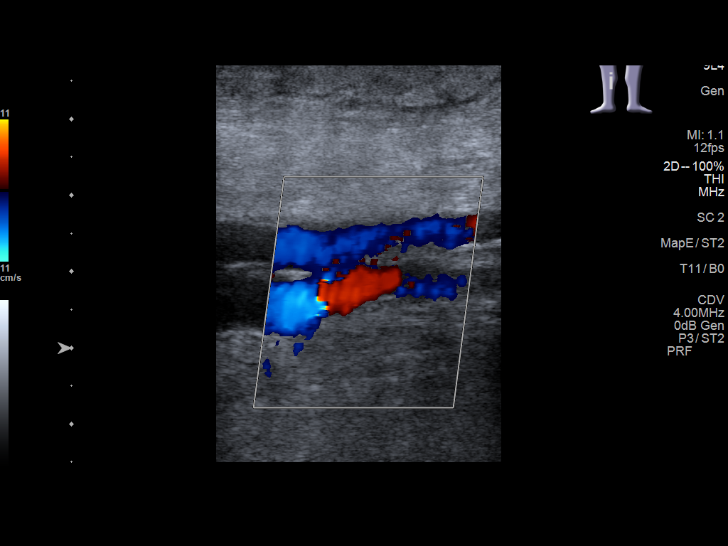
[im 24/27]
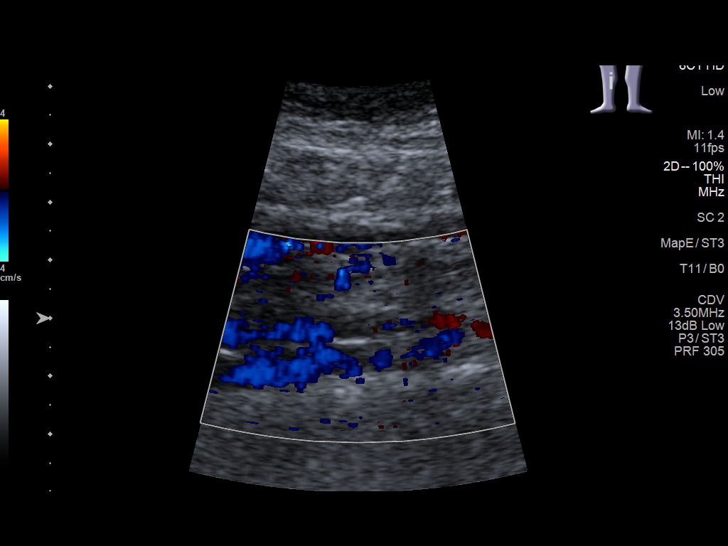
[im 27/27]
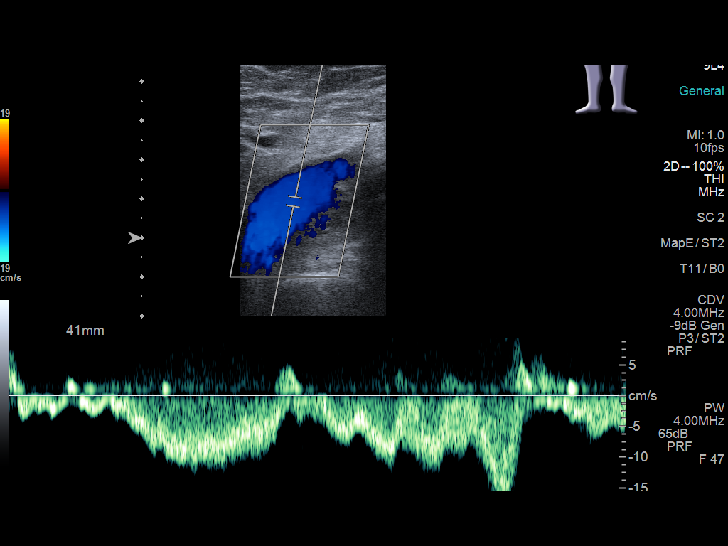

[13 of 24 positions shown; findings below may reference images not displayed]

FINDINGS: Contralateral Common Femoral Vein: Respiratory phasicity is normal
and symmetric with the symptomatic side. No evidence of thrombus.
Normal compressibility.

Common Femoral Vein: No evidence of thrombus. Normal
compressibility, respiratory phasicity and response to augmentation.

Saphenofemoral Junction: No evidence of thrombus. Normal
compressibility and flow on color Doppler imaging.

Profunda Femoral Vein: No evidence of thrombus. Normal
compressibility and flow on color Doppler imaging.

Femoral Vein: No evidence of thrombus. Normal compressibility,
respiratory phasicity and response to augmentation.

Popliteal Vein: No evidence of thrombus. Normal compressibility,
respiratory phasicity and response to augmentation.

Calf Veins: No evidence of thrombus. Normal compressibility and flow
on color Doppler imaging.

Superficial Great Saphenous Vein: No evidence of thrombus. Normal
compressibility and flow on color Doppler imaging

Other Findings:  None.
IMPRESSION: Sonographic survey of the right lower extremity negative for DVT

## 2022-01-05 ENCOUNTER — Other Ambulatory Visit: Payer: Medicare Other | Admitting: Primary Care

## 2022-01-05 DIAGNOSIS — Z515 Encounter for palliative care: Secondary | ICD-10-CM

## 2022-01-05 DIAGNOSIS — G894 Chronic pain syndrome: Secondary | ICD-10-CM

## 2022-01-05 DIAGNOSIS — I872 Venous insufficiency (chronic) (peripheral): Secondary | ICD-10-CM

## 2022-01-05 NOTE — Progress Notes (Signed)
[facility] Consult Note Telephone: [phone]  Fax: [phone]    Date of encounter: 01/05/22 2:02 PM PATIENT NAME: Carlos [name] [place] [address]   [phone] (home) [phone] (work) DOB: 04-04-1926 MRN: [id number] PRIMARY CARE PROVIDER:    [name], MD,  [facility] [place] [address] [phone]  REFERRING PROVIDER:   [name], [place],  [place] [address] [phone]  RESPONSIBLE PARTY:    Contact Information     Name Relation Home Work [place] Daughter [phone]  [phone]   Kalman Drape [phone]          I met face to face with patient and family in  home. Palliative Care was asked to follow this patient by consultation request of  Feldpausch, Chrissie Noa, MD to address advance care planning and complex medical decision making. This is a follow up visit.                                   ASSESSMENT AND PLAN / RECOMMENDATIONS:   Advance Care Planning/Goals of Care: Goals include to maximize quality of life and symptom management. Patient/health care surrogate gave his/her permission to discuss. Our advance care planning conversation included a discussion about:     Exploration of personal, cultural or spiritual beliefs that might influence medical decisions  Exploration of goals of care in the event of a sudden injury or illness  Identification of a healthcare agent  Daughter susan Review of an  advance directive document . CODE STATUS:  FULL  Symptom Management/Plan:  Edema; Improved. Lungs clear. Has 2 + LE edema, encouraged compression and elevation  Bowel/bladder: WNL, active from diuretics  ADLs; daughter in home now to assist. This is going well.   Follow up Palliative Care Visit: Palliative care  to continue with Rn/SW team. Family aware.  This visit was coded based on medical decision making  (MDM).  PPS: 40%  HOSPICE ELIGIBILITY/DIAGNOSIS: TBD  Chief Complaint: debility  HISTORY OF PRESENT ILLNESS:  Carlos [name] is a 88 y.o. year old male  with CHF, LE Edema.   History obtained from review of EMR, discussion with primary team, and interview with family, facility staff/caregiver and/or Mr. Wellen.  I reviewed available labs, medications, imaging, studies and related documents from the EMR.  Records reviewed and summarized above.   ROS  General: NAD EYES: denies vision changes ENMT: denies dysphagia Cardiovascular: denies chest pain, denies DOE Pulmonary: denies cough, denies increased SOB Abdomen: endorses good appetite, denies constipation, endorses continence of bowel GU: denies dysuria, endorses continence of urine MSK:  denies increased weakness, no falls reported Skin: denies rashes or wounds Neurological:  endorses occ pain, denies insomnia Psych: Endorses positive mood Heme/lymph/immuno: denies bruises, abnormal bleeding  Physical Exam: Current and past weights: stable  Constitutional: NAD General: frail appearing, WNWD EYES: anicteric sclera, lids intact, no discharge  ENMT: hard of  hearing, oral mucous membranes moist, dentition intact CV: S1S2, RRR, 2+ bil  LE edema Pulmonary: LCTA, no increased work of breathing, no cough, room air Abdomen: intake 100%,  soft and non tender, no ascites GU: deferred MSK: + sarcopenia, moves all extremities, ambulatory with walker  Skin: warm and dry, no rashes or wounds on visible skin Neuro:  no  new generalized weakness,  no cognitive impairment Psych: non-anxious affect, A and O  x 3 Hem/lymph/immuno: no widespread bruising   Thank you for the opportunity to participate in the care of [name]. Please call our office at [phone] if we can be of additional assistance.   Jason Coop, NP   COVID-19 PATIENT SCREENING TOOL Asked and negative response unless otherwise noted:   Have you had symptoms of  covid, tested positive or been in contact with someone with symptoms/positive test in the past 5-10 days?

## 2022-03-04 ENCOUNTER — Ambulatory Visit (INDEPENDENT_AMBULATORY_CARE_PROVIDER_SITE_OTHER): Payer: Medicare Other

## 2022-03-04 ENCOUNTER — Ambulatory Visit
Admission: EM | Admit: 2022-03-04 | Discharge: 2022-03-04 | Disposition: A | Discharge status: Home / Self Care | Payer: Medicare Other | Attending: Physician Assistant | Admitting: Physician Assistant

## 2022-03-04 DIAGNOSIS — R051 Acute cough: Secondary | ICD-10-CM

## 2022-03-04 DIAGNOSIS — R062 Wheezing: Secondary | ICD-10-CM | POA: Insufficient documentation

## 2022-03-04 DIAGNOSIS — J029 Acute pharyngitis, unspecified: Secondary | ICD-10-CM

## 2022-03-04 DIAGNOSIS — R059 Cough, unspecified: Secondary | ICD-10-CM

## 2022-03-04 DIAGNOSIS — J069 Acute upper respiratory infection, unspecified: Secondary | ICD-10-CM | POA: Diagnosis not present

## 2022-03-04 DIAGNOSIS — R0981 Nasal congestion: Secondary | ICD-10-CM | POA: Insufficient documentation

## 2022-03-04 LAB — GROUP A STREP BY PCR: Group A Strep by PCR: NOT DETECTED

## 2022-03-04 MED ORDER — AMOXICILLIN-POT CLAVULANATE 875-125 MG PO TABS: 1.0000 | ORAL_TABLET | Freq: Two times a day (BID) | ORAL | 0 refills | Status: AC

## 2022-03-04 MED ORDER — BENZONATATE 200 MG PO CAPS: 200.0000 mg | ORAL_CAPSULE | Freq: Three times a day (TID) | ORAL | 0 refills | Status: DC | PRN

## 2022-03-04 NOTE — ED Provider Notes (Signed)
[facility]    CSN: [id number] Arrival date & time: 03/04/22  1235      History   Chief Complaint Chief Complaint  Patient presents with   Cough   Nasal Congestion    HPI Carlos [name] is a 89 y.o. male presenting for 1 week history of cough, congestion, sore throat and fatigue.  Patient has been around multiple sick family members but they were negative for COVID.  Patient has a history of CHF.  He denies any increased shortness of breath from baseline.  He has had some wheezing.  No associated fever or bodyaches.  Has been taking OTC cough medication for symptoms.  His medical history significant for A-fib, CHF, CAD, diabetes, previous MI, peripheral vascular disease, hypertension, hyperlipidemia, gout.  HPI  Past Medical History:  Diagnosis Date   Arthritis    Atrial fibrillation ([place])    CHF (congestive heart failure) ([place])    Chronic kidney disease    kidney stones   Coronary artery disease    Diabetes mellitus without complication (HCC)    Diverticulosis    GERD (gastroesophageal reflux disease)    Gout    Hyperlipidemia    Hypertension    Myocardial infarction ([place]) 2001   Peripheral vascular disease ([place])    Pneumothorax, left 1950    Patient Active Problem List   Diagnosis Date Noted   Hypotension    Syncope    Pre-syncope 09/14/2021   GERD (gastroesophageal reflux disease) 09/14/2021   Diarrhea 09/14/2021   Metal foreign body in ear region 09/14/2021   Bilateral rotator cuff dysfunction 03/17/2021   History of rotator cuff surgery 03/17/2021   Lumbar facet arthropathy 03/17/2021   Lumbar degenerative disc disease 03/17/2021   Chronic pain syndrome 03/17/2021   Anemia of chronic disease 09/10/2019   Edema of both lower legs due to peripheral venous insufficiency 09/10/2019   Edema of both lower extremities due to peripheral venous insufficiency 09/10/2019   Acute on chronic combined systolic and diastolic CHF (congestive heart failure) ([place])  05/23/2019   Acute pain of left shoulder 09/08/2015   CKD (chronic kidney disease), stage IV ([place]) 12/24/2014   Bradycardia 12/02/2014   TI (tricuspid incompetence) 10/21/2014   DD (diverticular disease) 07/30/2014   Essential (primary) hypertension 07/30/2014   Tuberculosis 07/30/2014   Hyperlipidemia 32/44/0102   Chronic systolic CHF (congestive heart failure), NYHA class 2 ([place]) 02/12/2014   AF (paroxysmal atrial fibrillation) ([place]) 02/12/2014   Paroxysmal A-fib ([place]) 02/12/2014   Accumulation of fluid in tissues 07/24/2013   Gout 07/24/2013   Diabetes mellitus, type 2 ([place]) 07/24/2013   Type 2 diabetes mellitus with renal manifestations, controlled ([place]) 07/24/2013   Peripheral blood vessel disorder ([place]) 07/19/2013   PVD (peripheral vascular disease) ([place]) 07/19/2013   Combined fat and carbohydrate induced hyperlipemia 05/21/2013   Encounter for therapeutic drug monitoring 12/18/2012   Chronic kidney disease (CKD), stage IV (severe) ([place]) 12/18/2012   Arteriosclerosis of coronary artery 12/18/2012   Decreased potassium in the blood 12/18/2012   H/O coronary artery bypass surgery 12/18/2012   H/O total knee replacement 12/18/2012   Encounter for therapeutic drug level monitoring 12/18/2012    Past Surgical History:  Procedure Laterality Date   CARDIAC CATHETERIZATION     CARDIAC SURGERY     4 bypass   CHOLECYSTECTOMY     CORONARY ARTERY BYPASS GRAFT     CYSTOSCOPY W/ RETROGRADES N/A 12/03/2014   Procedure: CYSTOSCOPY WITH ATTEMPT FOR RETROGRADE PYELOGRAM;  Surgeon: Horald Pollen  Pilar Jarvis, MD;  Location: [facility] ORS;  Service: Urology;  Laterality: N/A;   CYSTOSCOPY WITH LITHOLAPAXY N/A 12/03/2014   Procedure: CYSTOSCOPY WITH LITHOLAPAXY WITH HOLMIUM LASER ;  Surgeon: [name], MD;  Location: [facility];  Service: Urology;  Laterality: N/A;   FEMUR SURGERY     GALLBLADDER SURGERY     HERNIA REPAIR     JOINT REPLACEMENT Right    Total Knee Replacement   LUNG SURGERY Left     REPLACEMENT TOTAL KNEE         Home Medications    Prior to Admission medications   Medication Sig Start Date End Date Taking? Authorizing Provider  acetaminophen (TYLENOL) 650 MG CR tablet Take 1,300 mg by mouth 2 (two) times daily.   Yes [provider]  allopurinol (ZYLOPRIM) 300 MG tablet Take 150 mg by mouth daily.  05/09/14  Yes [provider]  amoxicillin-clavulanate (AUGMENTIN) 875-125 MG tablet Take 1 tablet by mouth every 12 (twelve) hours for 7 days. 03/04/22 03/11/22 Yes Danton Clap, PA-C  apixaban (ELIQUIS) 2.5 MG TABS tablet Take by mouth 2 (two) times daily.   Yes [provider]  benzonatate (TESSALON) 200 MG capsule Take 1 capsule (200 mg total) by mouth 3 (three) times daily as needed for cough. 03/04/22  Yes Laurene Footman B, PA-C  finasteride (PROSCAR) 5 MG tablet Take 1 tablet (5 mg total) by mouth daily. 08/13/18  Yes Billey Co, MD  ipratropium (ATROVENT) 0.03 % nasal spray Place 2 sprays into both nostrils 3 (three) times daily. 07/19/21  Yes [provider]  loperamide (IMODIUM) 2 MG capsule Take 1 capsule (2 mg total) by mouth as needed for diarrhea or loose stools. 09/17/21  Yes Eugenie Filler, MD  loratadine (CLARITIN) 10 MG tablet Take 10 mg by mouth daily as needed. 07/16/19  Yes [provider]  metoprolol tartrate (LOPRESSOR) 25 MG tablet Take 0.5 tablets (12.5 mg total) by mouth 2 (two) times daily. 09/17/21  Yes Eugenie Filler, MD  midodrine (PROAMATINE) 2.5 MG tablet Take 2.5 mg by mouth 2 (two) times daily.   Yes [provider]  Multiple Vitamin (MULTI-VITAMINS) TABS Take 1 tablet by mouth daily.    Yes [provider]  mupirocin ointment (BACTROBAN) 2 % Apply topically 3 (three) times daily. 07/19/21  Yes [provider]  pantoprazole (PROTONIX) 40 MG tablet Take 40 mg by mouth daily.   Yes [provider]  sertraline (ZOLOFT) 50 MG tablet Take 1 tablet by mouth  daily. 06/17/21  Yes [provider]  simvastatin (ZOCOR) 40 MG tablet Take 40 mg by mouth daily at 6 PM.  07/07/14  Yes [provider]  tamsulosin (FLOMAX) 0.4 MG CAPS capsule Take 1 capsule (0.4 mg total) by mouth daily. 09/20/21  Yes Eugenie Filler, MD  triamcinolone cream (KENALOG) 0.1 % Apply 1 Application topically 2 (two) times daily. 10/02/21  Yes Margarette Canada, NP  ferrous sulfate 325 (65 FE) MG tablet Take 1 tablet (325 mg total) by mouth daily. 09/12/19 11/29/19  Sharen Hones, MD  guaiFENesin (MUCINEX) 600 MG 12 hr tablet Take 2 tablets (1,200 mg total) by mouth 2 (two) times daily as needed. 09/17/21   Eugenie Filler, MD  Omega-3 Fatty Acids (FISH OIL) 1000 MG CAPS Take 1 capsule by mouth daily. Patient not taking: Reported on 03/17/2021    [provider]  torsemide (DEMADEX) 20 MG tablet Take 1 tablet (20 mg total) by mouth  daily. 09/19/21 10/19/21  Eugenie Filler, MD  colchicine 0.6 MG tablet Take 0.6 mg by mouth as needed.   08/28/18  [provider]    Family History Family History  Problem Relation Age of Onset   Lung cancer Mother    Tuberculosis Brother     Social History Social History   Tobacco Use   Smoking status: Former    Types: Pipe    Quit date: 07/30/2006    Years since quitting: 15.6   Smokeless tobacco: Never  Vaping Use   Vaping Use: Never used  Substance Use Topics   Alcohol use: Not Currently    Alcohol/week: 14.0 standard drinks of alcohol    Types: 14 Glasses of wine per week    Comment: one glass of wine daily   Drug use: No     Allergies   Codeine, Gabapentin, Morphine, Oxycodone, and Tramadol   Review of Systems Review of Systems  Constitutional:  Positive for fatigue. Negative for fever.  HENT:  Positive for congestion, rhinorrhea and sore throat. Negative for sinus pressure and sinus pain.   Respiratory:  Positive for cough and wheezing. Negative for shortness of breath.   Gastrointestinal:   Negative for abdominal pain, diarrhea, nausea and vomiting.  Musculoskeletal:  Negative for myalgias.  Neurological:  Negative for weakness, light-headedness and headaches.  Hematological:  Negative for adenopathy.     Physical Exam Triage Vital Signs ED Triage Vitals  Enc Vitals Group     BP      Pulse      Resp      Temp      Temp src      SpO2      Weight      Height      Head Circumference      Peak Flow      Pain Score      Pain Loc      Pain Edu?      Excl. in [place]?    No data found.  Updated Vital Signs BP (!) 131/56 (BP Location: Left Arm)   Pulse 70   Temp 97.6 F (36.4 C) (Oral)   Resp 18   Ht 5\' 7"  (1.702 m)   Wt 195 lb (88.5 kg)   SpO2 98%   BMI 30.54 kg/m     Physical Exam Vitals and nursing note reviewed.  Constitutional:      General: He is not in acute distress.    Appearance: Normal appearance. He is well-developed. He is not ill-appearing.  HENT:     Head: Normocephalic and atraumatic.     Nose: Congestion present.     Mouth/Throat:     Mouth: Mucous membranes are moist.     Pharynx: Oropharynx is clear. Posterior oropharyngeal erythema present.  Eyes:     General: No scleral icterus.    Conjunctiva/sclera: Conjunctivae normal.  Cardiovascular:     Rate and Rhythm: Normal rate and regular rhythm.     Heart sounds: Normal heart sounds.  Pulmonary:     Effort: Pulmonary effort is normal. No respiratory distress.     Breath sounds: Wheezing (diffuse, throughout all lung fields) present.  Musculoskeletal:     Cervical back: Neck supple.  Skin:    General: Skin is warm and dry.     Capillary Refill: Capillary refill takes Carlos than 2 seconds.  Neurological:     General: No focal deficit present.     Mental Status: He is  alert. Mental status is at baseline.     Motor: No weakness.     Gait: Gait normal.  Psychiatric:        Mood and Affect: Mood normal.        Behavior: Behavior normal.      UC Treatments / Results  Labs (all  labs ordered are listed, but only abnormal results are displayed) Labs Reviewed  GROUP A STREP BY PCR    EKG   Radiology DG Chest 2 View  Result Date: 03/04/2022 CLINICAL DATA:  Cough. EXAM: CHEST - 2 VIEW COMPARISON:  September 14, 2021. FINDINGS: Stable cardiomediastinal silhouette. Status post coronary bypass graft. No acute pulmonary disease is noted. Calcified granulomas are noted bilaterally. Bony thorax is unremarkable. IMPRESSION: No active cardiopulmonary disease. Aortic Atherosclerosis (ICD10-I70.0). Electronically Signed   By: Marijo Conception M.D.   On: 03/04/2022 13:37    Procedures Procedures (including critical care time)  Medications Ordered in UC Medications - No data to display  Initial Impression / Assessment and Plan / UC Course  I have reviewed the triage vital signs and the nursing notes.  Pertinent labs & imaging results that were available during my care of the patient were reviewed by me and considered in my medical decision making (see chart for details).   89 year old male presents for 1 week history of cough, congestion, sore throat and wheezing.  No fever, shortness of breath, vomiting or diarrhea.  Around multiple sick family members.  They were negative for COVID.  Vitals are stable.  He is afebrile.  He is overall well-appearing.  On exam he has nasal congestion and mild posterior pharyngeal erythema.  Scattered wheezes throughout all lung fields.  PCR strep test and chest x-ray obtained.  Negative for strep.  Normal chest x-ray.  Discussed all results with patient and family.  Advised likely viral.  Supportive care encouraged.  Sent benzonatate to pharmacy advised plenty rest and fluids, Tylenol as needed for aches.  Printed prescription for Augmentin in case he is not improving in the next few days.  Reviewed ED precautions.   Final Clinical Impressions(s) / UC Diagnoses   Final diagnoses:  Viral upper respiratory tract infection  Acute cough  Nasal  congestion  Sore throat     Discharge Instructions      -Most likely viral illness.  If no improvement in 3 days may consider starting antibiotic. - I sent cough medication to the pharmacy.  Plenty rest and fluids. - Need to be seen again if fever or any breathing difficulty.     ED Prescriptions     Medication Sig Dispense Auth. Provider   benzonatate (TESSALON) 200 MG capsule Take 1 capsule (200 mg total) by mouth 3 (three) times daily as needed for cough. 20 capsule Laurene Footman B, PA-C   amoxicillin-clavulanate (AUGMENTIN) 875-125 MG tablet Take 1 tablet by mouth every 12 (twelve) hours for 7 days. 14 tablet Gretta Cool      PDMP not reviewed this encounter.   Danton Clap, PA-C 03/04/22 1351

## 2022-03-04 NOTE — ED Triage Notes (Signed)
Pt is with his daughter  Pt c/o chest congestion, cough, sore throat x1week  Pt was around family who was sick over the holidays. Family tested negative for covid  Pt has congestive heart failure.

## 2022-03-04 NOTE — Discharge Instructions (Addendum)
-  Most likely viral illness.  If no improvement in 3 days may consider starting antibiotic. - I sent cough medication to the pharmacy.  Plenty rest and fluids. - Need to be seen again if fever or any breathing difficulty.

## 2022-10-08 ENCOUNTER — Ambulatory Visit
Admission: EM | Admit: 2022-10-08 | Discharge: 2022-10-08 | Disposition: A | Discharge status: Home / Self Care | Payer: Medicare Other

## 2022-10-08 ENCOUNTER — Ambulatory Visit (INDEPENDENT_AMBULATORY_CARE_PROVIDER_SITE_OTHER): Payer: Medicare Other

## 2022-10-08 DIAGNOSIS — M79675 Pain in left toe(s): Secondary | ICD-10-CM | POA: Diagnosis not present

## 2022-10-08 DIAGNOSIS — M85872 Other specified disorders of bone density and structure, left ankle and foot: Secondary | ICD-10-CM

## 2022-10-08 DIAGNOSIS — S92405A Nondisplaced unspecified fracture of left great toe, initial encounter for closed fracture: Secondary | ICD-10-CM

## 2022-10-08 NOTE — ED Triage Notes (Signed)
Pt c/o L big toe pain due to fall x2 days. States he fell in his bedroom.

## 2022-10-08 NOTE — Discharge Instructions (Addendum)
Rest,ice,elevate,wear post op shoe for comfort. Follow up with podiatrist or orthopedist of your choice-call next week for appt. Take tylenol for pain.

## 2022-10-08 NOTE — ED Provider Notes (Signed)
[facility]    CSN: [id number] Arrival date & time: 10/08/22  1100      History   Chief Complaint Chief Complaint  Patient presents with   Toe Pain   Fall    HPI Carlos [name] is a 89 y.o. male.   89 year old male, Carlos [name], presents to the urgent care for evaluation of left great toe pain.  Patient states he tripped on a date and dragged caught his toe on carpet. Pt did not strike head, tumbled and caught self, no other complaints at this time.  The history is provided by the patient. No language interpreter was used.    Past Medical History:  Diagnosis Date   Arthritis    Atrial fibrillation (HCC)    CHF (congestive heart failure) (HCC)    Chronic kidney disease    kidney stones   Coronary artery disease    Diabetes mellitus without complication (HCC)    Diverticulosis    GERD (gastroesophageal reflux disease)    Gout    Hyperlipidemia    Hypertension    Myocardial infarction [facility]) 2001   Peripheral vascular disease (HCC)    Pneumothorax, left 1950    Patient Active Problem List   Diagnosis Date Noted   Nondisplaced unspecified fracture of left great toe, initial encounter for closed fracture 10/08/2022   Osteopenia of left foot 10/08/2022   Hypotension    Syncope    Pre-syncope 09/14/2021   GERD (gastroesophageal reflux disease) 09/14/2021   Diarrhea 09/14/2021   Metal foreign body in ear region 09/14/2021   Bilateral rotator cuff dysfunction 03/17/2021   History of rotator cuff surgery 03/17/2021   Lumbar facet arthropathy 03/17/2021   Lumbar degenerative disc disease 03/17/2021   Chronic pain syndrome 03/17/2021   Anemia of chronic disease 09/10/2019   Edema of both lower legs due to peripheral venous insufficiency 09/10/2019   Edema of both lower extremities due to peripheral venous insufficiency 09/10/2019   Acute on chronic combined systolic and diastolic CHF (congestive heart failure) (HCC) 05/23/2019   Acute pain of left shoulder  09/08/2015   CKD (chronic kidney disease), stage IV (HCC) 12/24/2014   Bradycardia 12/02/2014   TI (tricuspid incompetence) 10/21/2014   DD (diverticular disease) 07/30/2014   Essential (primary) hypertension 07/30/2014   Tuberculosis 07/30/2014   Hyperlipidemia 07/30/2014   Chronic systolic CHF (congestive heart failure), NYHA class 2 (HCC) 02/12/2014   AF (paroxysmal atrial fibrillation) (HCC) 02/12/2014   Paroxysmal A-fib (HCC) 02/12/2014   Accumulation of fluid in tissues 07/24/2013   Gout 07/24/2013   Diabetes mellitus, type 2 (HCC) 07/24/2013   Type 2 diabetes mellitus with renal manifestations, controlled (HCC) 07/24/2013   Peripheral blood vessel disorder (HCC) 07/19/2013   PVD (peripheral vascular disease) (HCC) 07/19/2013   Combined fat and carbohydrate induced hyperlipemia 05/21/2013   Encounter for therapeutic drug monitoring 12/18/2012   Chronic kidney disease (CKD), stage IV (severe) (HCC) 12/18/2012   Arteriosclerosis of coronary artery 12/18/2012   Decreased potassium in the blood 12/18/2012   H/O coronary artery bypass surgery 12/18/2012   H/O total knee replacement 12/18/2012   Encounter for therapeutic drug level monitoring 12/18/2012    Past Surgical History:  Procedure Laterality Date   CARDIAC CATHETERIZATION     CARDIAC SURGERY     4 bypass   CHOLECYSTECTOMY     CORONARY ARTERY BYPASS GRAFT     CYSTOSCOPY W/ RETROGRADES N/A 12/03/2014   Procedure: CYSTOSCOPY WITH ATTEMPT FOR RETROGRADE PYELOGRAM;  Surgeon: Cloyde Reams  Sherryl Barters, MD;  Location: [facility];  Service: Urology;  Laterality: N/A;   CYSTOSCOPY WITH LITHOLAPAXY N/A 12/03/2014   Procedure: CYSTOSCOPY WITH LITHOLAPAXY WITH HOLMIUM LASER ;  Surgeon: Hildred Laser, MD;  Location: [facility];  Service: Urology;  Laterality: N/A;   FEMUR SURGERY     GALLBLADDER SURGERY     HERNIA REPAIR     JOINT REPLACEMENT Right    Total Knee Replacement   LUNG SURGERY Left    REPLACEMENT TOTAL KNEE          Home Medications    Prior to Admission medications   Medication Sig Start Date End Date Taking? Authorizing Provider  acetaminophen (TYLENOL) 650 MG CR tablet Take 1,300 mg by mouth 2 (two) times daily.   Yes [provider]  allopurinol (ZYLOPRIM) 300 MG tablet Take 150 mg by mouth daily.  05/09/14  Yes [provider]  apixaban (ELIQUIS) 2.5 MG TABS tablet Take by mouth 2 (two) times daily.   Yes [provider]  erythromycin ophthalmic ointment SMARTSIG:Sparingly In Eye(s) 3 Times Daily 10/06/22  Yes [provider]  finasteride (PROSCAR) 5 MG tablet Take 1 tablet (5 mg total) by mouth daily. 08/13/18  Yes Sondra Come, MD  guaiFENesin (MUCINEX) 600 MG 12 hr tablet Take 2 tablets (1,200 mg total) by mouth 2 (two) times daily as needed. 09/17/21  Yes Rodolph Bong, MD  ipratropium (ATROVENT) 0.03 % nasal spray Place 2 sprays into both nostrils 3 (three) times daily. 07/19/21  Yes [provider]  loperamide (IMODIUM) 2 MG capsule Take 1 capsule (2 mg total) by mouth as needed for diarrhea or loose stools. 09/17/21  Yes Rodolph Bong, MD  loratadine (CLARITIN) 10 MG tablet Take 10 mg by mouth daily as needed. 07/16/19  Yes [provider]  metoprolol tartrate (LOPRESSOR) 25 MG tablet Take 0.5 tablets (12.5 mg total) by mouth 2 (two) times daily. 09/17/21  Yes Rodolph Bong, MD  midodrine (PROAMATINE) 2.5 MG tablet Take 2.5 mg by mouth 2 (two) times daily.   Yes [provider]  Multiple Vitamin (MULTI-VITAMINS) TABS Take 1 tablet by mouth daily.    Yes [provider]  mupirocin ointment (BACTROBAN) 2 % Apply topically 3 (three) times daily. 07/19/21  Yes [provider]  Omega-3 Fatty Acids (FISH OIL) 1000 MG CAPS Take 1 capsule by mouth daily.   Yes [provider]  pantoprazole (PROTONIX) 40 MG tablet Take 40 mg by mouth daily.   Yes [provider]  sertraline (ZOLOFT) 50 MG  tablet Take 1 tablet by mouth daily. 06/17/21  Yes [provider]  simvastatin (ZOCOR) 40 MG tablet Take 40 mg by mouth daily at 6 PM.  07/07/14  Yes [provider]  tamsulosin (FLOMAX) 0.4 MG CAPS capsule Take 1 capsule (0.4 mg total) by mouth daily. 09/20/21  Yes Rodolph Bong, MD  triamcinolone cream (KENALOG) 0.1 % Apply 1 Application topically 2 (two) times daily. 10/02/21  Yes Becky Augusta, NP  benzonatate (TESSALON) 200 MG capsule Take 1 capsule (200 mg total) by mouth 3 (three) times daily as needed for cough. 03/04/22   Eusebio Friendly B, PA-C  ferrous sulfate 325 (65 FE) MG tablet Take 1 tablet (325 mg total) by mouth daily. 09/12/19 11/29/19  Marrion Coy, MD  torsemide (DEMADEX) 20 MG tablet Take 1 tablet (20 mg total) by mouth daily. 09/19/21 10/19/21  Rodolph Bong, MD  colchicine 0.6 MG tablet Take 0.6  mg by mouth as needed.   08/28/18  [provider]    Family History Family History  Problem Relation Age of Onset   Lung cancer Mother    Tuberculosis Brother     Social History Social History   Tobacco Use   Smoking status: Former    Types: Pipe    Quit date: 07/30/2006    Years since quitting: 16.2   Smokeless tobacco: Never  Vaping Use   Vaping status: Never Used  Substance Use Topics   Alcohol use: Not Currently    Alcohol/week: 14.0 standard drinks of alcohol    Types: 14 Glasses of wine per week    Comment: one glass of wine daily   Drug use: No     Allergies   Codeine, Gabapentin, Oxycodone, Morphine, and Tramadol   Review of Systems Review of Systems  Musculoskeletal:  Positive for joint swelling and myalgias.  Skin:  Positive for color change.  All other systems reviewed and are negative.    Physical Exam Triage Vital Signs ED Triage Vitals  Encounter Vitals Group     BP 10/08/22 1105 91/69     Systolic BP Percentile --      Diastolic BP Percentile --      Pulse Rate 10/08/22 1105 65     Resp 10/08/22 1105 16      Temp 10/08/22 1105 98 F (36.7 C)     Temp Source 10/08/22 1105 Oral     SpO2 10/08/22 1105 96 %     Weight 10/08/22 1104 194 lb (88 kg)     Height 10/08/22 1104 5\' 7"  (1.702 m)     Head Circumference --      Peak Flow --      Pain Score 10/08/22 1110 4     Pain Loc --      Pain Education --      Exclude from Growth Chart --    No data found.  Updated Vital Signs BP 91/69 (BP Location: Right Arm)   Pulse 65   Temp 98 F (36.7 C) (Oral)   Resp 16   Ht 5\' 7"  (1.702 m)   Wt 194 lb (88 kg)   SpO2 96%   BMI 30.38 kg/m   Visual Acuity Right Eye Distance:   Left Eye Distance:   Bilateral Distance:    Right Eye Near:   Left Eye Near:    Bilateral Near:     Physical Exam Vitals and nursing note reviewed.  Cardiovascular:     Pulses:          Dorsalis pedis pulses are 2+ on the left side.  Feet:     Comments: +ecchymosis to left great toe, +TTP at base of great toe. Neurological:     General: No focal deficit present.     Mental Status: He is alert and oriented to person, place, and time.     Cranial Nerves: No cranial nerve deficit.     Sensory: No sensory deficit.  Psychiatric:        Attention and Perception: Attention normal.        Mood and Affect: Mood normal.        Speech: Speech normal.        Behavior: Behavior normal.      UC Treatments / Results  Labs (all labs ordered are listed, but only abnormal results are displayed) Labs Reviewed - No data to display  EKG   Radiology DG Toe [place]  Left  Result Date: 10/08/2022 CLINICAL DATA:  Fall 2 days prior with left first toe pain EXAM: LEFT GREAT TOE COMPARISON:  None Available. FINDINGS: Acute nondisplaced intra-articular fracture of the medial base of the proximal phalanx left first toe with surrounding soft tissue swelling. No additional fracture. No dislocation. Diffuse osteopenia. No suspicious focal osseous lesions. Suggestion of chronic juxta-articular erosions along the medial distal first  metatarsal with associated productive osseous changes. No tophaceous soft tissue calcifications. IMPRESSION: 1. Acute nondisplaced intra-articular fracture of the medial base of the proximal phalanx left first toe. 2. Suggestion of chronic juxta-articular erosions along the medial distal first metatarsal with associated productive osseous changes, which can be seen with gout. 3. Diffuse osteopenia. Electronically Signed   By: Delbert Phenix M.D.   On: 10/08/2022 12:00    Procedures Procedures (including critical care time)  Medications Ordered in UC Medications - No data to display  Initial Impression / Assessment and Plan / UC Course  I have reviewed the triage vital signs and the nursing notes.  Pertinent labs & imaging results that were available during my care of the patient were reviewed by me and considered in my medical decision making (see chart for details).  Clinical Course as of 10/08/22 1613  Sat Oct 08, 2022  1136 Xray ordered for left great due to injury,swelling,bruising pain, injury 8/8 [JD]  1205 IMPRESSION: 1. Acute nondisplaced intra-articular fracture of the medial base of the proximal phalanx left first toe. 2. Suggestion of chronic juxta-articular erosions along the medial distal first metatarsal with associated productive osseous changes, which can be seen with gout. 3. Diffuse osteopenia.    [JD]    Clinical Course User Index [JD] , Para March, NP  Discussed exam findings and plan of care with the patient and family, both verbalized understanding this provider, postop shoe given referred to orthopedics.   Ddx: Toe fracture, left, osteopenia, foot pain Final Clinical Impressions(s) / UC Diagnoses   Final diagnoses:  Nondisplaced unspecified fracture of left great toe, initial encounter for closed fracture  Osteopenia of left foot     Discharge Instructions      Rest,ice,elevate,wear post op shoe for comfort. Follow up with podiatrist or orthopedist  of your choice-call next week for appt. Take tylenol for pain.      ED Prescriptions   None    PDMP not reviewed this encounter.   Clancy Gourd, NP 10/08/22 [phone]

## 2022-12-29 ENCOUNTER — Ambulatory Visit
Admission: EM | Admit: 2022-12-29 | Discharge: 2022-12-29 | Disposition: A | Discharge status: Home / Self Care | Payer: Medicare Other | Attending: Emergency Medicine | Admitting: Emergency Medicine

## 2022-12-29 ENCOUNTER — Ambulatory Visit (INDEPENDENT_AMBULATORY_CARE_PROVIDER_SITE_OTHER): Payer: Medicare Other

## 2022-12-29 DIAGNOSIS — S90111A Contusion of right great toe without damage to nail, initial encounter: Secondary | ICD-10-CM

## 2022-12-29 NOTE — ED Provider Notes (Signed)
HPI  SUBJECTIVE:  Gregroy [name] is a 89 y.o. male who presents with pain, bruising on his distal right first toe after falling out of bed yesterday morning at 0700.  Patient states that he over reached, and slid out of bed.  Daughter states that they found him sitting propped up against the side of the bed.  He denies hitting his head, loss consciousness, amnesia, chest pain, shortness of breath, palpitations, syncope causing the fall.  Patient is acting normally per family member.  Patient denies limitation of motion of the toe, numbness or tingling, or injury to the rest of the foot.  Symptoms are worse when he wears a shoe, no alleviating factors.  He has not tried anything for this. Patient has a past medical history of atrial fibrillation on Eliquis, CHF, stage IV chronic kidney disease, coronary artery disease, diabetes, hyperlipidemia, hypertension, MI status post CABG, peripheral vascular disease.  Past Medical History:  Diagnosis Date   Arthritis    Atrial fibrillation (HCC)    CHF (congestive heart failure) (HCC)    Chronic kidney disease    kidney stones   Coronary artery disease    Diabetes mellitus without complication (HCC)    Diverticulosis    GERD (gastroesophageal reflux disease)    Gout    Hyperlipidemia    Hypertension    Myocardial infarction [facility]) 2001   Peripheral vascular disease (HCC)    Pneumothorax, left 1950    Past Surgical History:  Procedure Laterality Date   CARDIAC CATHETERIZATION     CARDIAC SURGERY     4 bypass   CHOLECYSTECTOMY     cholecystectomy     CORONARY ARTERY BYPASS GRAFT     CYSTOSCOPY W/ RETROGRADES N/A 12/03/2014   Procedure: CYSTOSCOPY WITH ATTEMPT FOR RETROGRADE PYELOGRAM;  Surgeon: Hildred Laser, MD;  Location: [facility] OR[facility];  Service: Urology;  Laterality: N/A;   CYSTOSCOPY WITH LITHOLAPAXY N/A 12/03/2014   Procedure: CYSTOSCOPY WITH LITHOLAPAXY WITH HOLMIUM LASER ;  Surgeon: Hildred Laser, MD;  Location: [facility] OR[facility];  Service:  Urology;  Laterality: N/A;   FEMUR SURGERY     GALLBLADDER SURGERY     HERNIA REPAIR     JOINT REPLACEMENT Right    Total Knee Replacement   LUNG SURGERY Left    REPLACEMENT TOTAL KNEE      Family History  Problem Relation Age of Onset   Lung cancer Mother    Tuberculosis Brother     Social History   Tobacco Use   Smoking status: Former    Types: Pipe    Quit date: 07/30/2006    Years since quitting: 16.4   Smokeless tobacco: Never  Vaping Use   Vaping status: Never Used  Substance Use Topics   Alcohol use: Not Currently    Alcohol/week: 14.0 standard drinks of alcohol    Types: 14 Glasses of wine per week    Comment: one glass of wine daily   Drug use: No    No current facility-administered medications for this encounter.  Current Outpatient Medications:    acetaminophen (TYLENOL) 650 MG CR tablet, Take 1,300 mg by mouth 2 (two) times daily., Disp: , Rfl:    allopurinol (ZYLOPRIM) 300 MG tablet, Take 150 mg by mouth daily. , Disp: , Rfl:    apixaban (ELIQUIS) 2.5 MG TABS tablet, Take by mouth 2 (two) times daily., Disp: , Rfl:    finasteride (PROSCAR) 5 MG tablet, Take 1 tablet (5 mg total) by mouth daily., Disp: 90  tablet, Rfl: 3   guaiFENesin (MUCINEX) 600 MG 12 hr tablet, Take 2 tablets (1,200 mg total) by mouth 2 (two) times daily as needed., Disp: , Rfl:    loratadine (CLARITIN) 10 MG tablet, Take 10 mg by mouth daily as needed., Disp: , Rfl:    metoprolol tartrate (LOPRESSOR) 25 MG tablet, Take 0.5 tablets (12.5 mg total) by mouth 2 (two) times daily., Disp: 30 tablet, Rfl: 1   midodrine (PROAMATINE) 2.5 MG tablet, Take 2.5 mg by mouth 2 (two) times daily., Disp: , Rfl:    pantoprazole (PROTONIX) 40 MG tablet, Take 40 mg by mouth daily., Disp: , Rfl:    sertraline (ZOLOFT) 50 MG tablet, Take 1 tablet by mouth daily., Disp: , Rfl:    simvastatin (ZOCOR) 40 MG tablet, Take 40 mg by mouth daily at 6 PM. , Disp: , Rfl:    tamsulosin (FLOMAX) 0.4 MG CAPS capsule, Take  1 capsule (0.4 mg total) by mouth daily., Disp: 90 capsule, Rfl: 3   torsemide (DEMADEX) 20 MG tablet, Take 1 tablet (20 mg total) by mouth daily., Disp: 30 tablet, Rfl: 0   ferrous sulfate 325 (65 FE) MG tablet, Take 1 tablet (325 mg total) by mouth daily., Disp: 30 tablet, Rfl: 0   ipratropium (ATROVENT) 0.03 % nasal spray, Place 2 sprays into both nostrils 3 (three) times daily., Disp: , Rfl:    loperamide (IMODIUM) 2 MG capsule, Take 1 capsule (2 mg total) by mouth as needed for diarrhea or loose stools., Disp: 20 capsule, Rfl: 0   Multiple Vitamin (MULTI-VITAMINS) TABS, Take 1 tablet by mouth daily. , Disp: , Rfl:    mupirocin ointment (BACTROBAN) 2 %, Apply topically 3 (three) times daily., Disp: , Rfl:    Omega-3 Fatty Acids (FISH OIL) 1000 MG CAPS, Take 1 capsule by mouth daily., Disp: , Rfl:   Allergies  Allergen Reactions   Codeine    Gabapentin     Other reaction(s): Hallucination   Oxycodone Other (See Comments)   Morphine Other (See Comments)    "nightmares" (Patient had Morphine and tolerated it fine)   Tramadol      ROS  As noted in HPI.   Physical Exam  BP (!) 100/49   Pulse 62   Temp 99 F (37.2 C) (Oral)   Resp 20   SpO2 97%   Constitutional: Well developed, well nourished, no acute distress Eyes:  EOMI, conjunctiva normal bilaterally HENT: Normocephalic, atraumatic,mucus membranes moist Respiratory: Normal inspiratory effort Cardiovascular: Normal rate GI: nondistended skin: No rash, skin intact Musculoskeletal: Right foot: Tender bruising distal phalanx right first toe.  No subungual hematoma.  Cap refill approximately 3 seconds.  Skin intact.  DP 1+.  No tenderness along the proximal phalanx, MCP joint.  No tenderness along the first metatarsal.  Rest of foot nontender.  Patient able to wiggle all toes.  Sensation grossly intact.  Neurologic: Alert & oriented x 3, no focal neuro deficits.  GCS 15. Psychiatric: Speech and behavior appropriate   ED  Course   Medications - No data to display  Orders Placed This Encounter  Procedures   DG Toe Great Right    Standing Status:   Standing    Number of Occurrences:   1    Order Specific Question:   Reason for Exam (SYMPTOM  OR DIAGNOSIS REQUIRED)    Answer:   hit toe on something    No results found for this or any previous visit (from the past 24  hour(s)). DG Toe Great Right  Result Date: 12/29/2022 CLINICAL DATA:  Trauma to the right great toe. EXAM: RIGHT GREAT TOE COMPARISON:  None Available. FINDINGS: There is no acute fracture or dislocation. The bones are osteopenic. There is hallux valgus with arthritic changes of the first MTP joint. The soft tissues are grossly unremarkable. IMPRESSION: 1. No acute fracture or dislocation. 2. Hallux valgus with arthritic changes of the first MTP joint. Electronically Signed   By: Elgie Collard M.D.   On: 12/29/2022 17:32    ED Clinical Impression  1. Contusion of right great toe without damage to nail, initial encounter      ED Assessment/Plan     There is absolutely no concern for head injury, cardiac event or syncope causing his toe injury.  He is awake, alert, and responding to questions appropriately.  Reviewed imaging independently.  No appreciable fracture as read by me.  Formal radiology report pending.  Will call daughter Fannie Knee at [phone] if radiology sees a fracture.  In the meantime, Tylenol, ice, buddy tape as needed.  Follow-up with PCP as needed  Reviewed radiology report.  No acute fracture or dislocation.  No arthritic changes at the first MTP joint.  See radiology report for full details.  My read consistent with radiology's read.  No change in plan.  Discussed imaging, MDM, treatment plan, and plan for follow-up with family member and patient. Discussed sn/sx that should prompt return to the ED. they agree with plan.   No orders of the defined types were placed in this encounter.     *This clinic note was  created using Dragon dictation software. Therefore, there may be occasional mistakes despite careful proofreading.  ?    Domenick Gong, MD 12/29/22 1956

## 2022-12-29 NOTE — Discharge Instructions (Addendum)
I did not appreciate any obvious fracture on x-ray.  The formal radiology report is pending.  We will contact you if radiology sees a fracture.  In the meantime, Tylenol 1000 mg 3 times a day, ice.  Go to the emergency room for altered mental status, chest pain, shortness of breath, headache or other concerns.

## 2022-12-29 NOTE — ED Triage Notes (Addendum)
Pt was found on the floor sitting on his buttock beside his bed by son-in-law. Family believes he slipped out of bed and hit his right great toe on the night stand. Pt denies injury to head or LOC.

## 2023-01-15 IMAGING — CT CT ABD-PELV W/O CM
2 of 4 series · 16 of 46 positions shown, 18 images · non-contrast
Comparison: None.

CLINICAL DATA: Left-sided abdominal pain. Stage 4 chronic kidney
disease.

EXAM:
CT ABDOMEN AND PELVIS WITHOUT CONTRAST
TECHNIQUE: Multidetector CT imaging of the abdomen and pelvis was performed
following the standard protocol without IV contrast.

[Series 2: axial st · axial · 0.79mm/px · z∈[-1119,-674]mm · 13 of 99 slices shown, 15 images]
[im 5/99  soft-tissue]
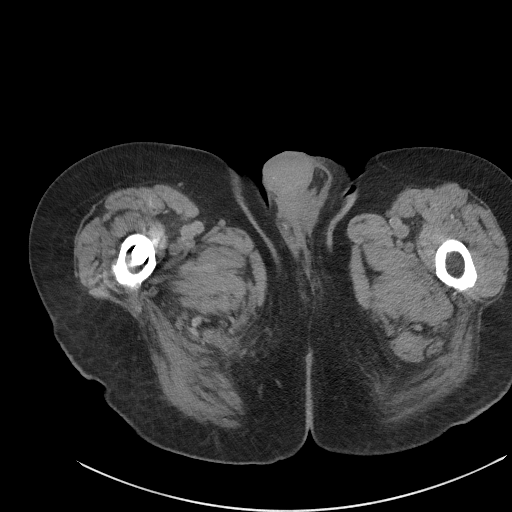
[im 5/99  bone]
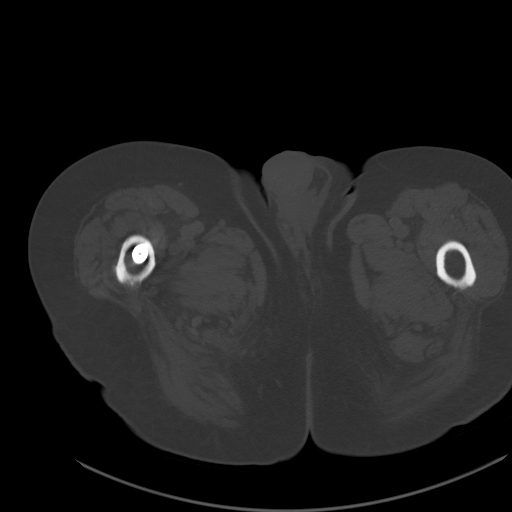
[im 13/99  soft-tissue]
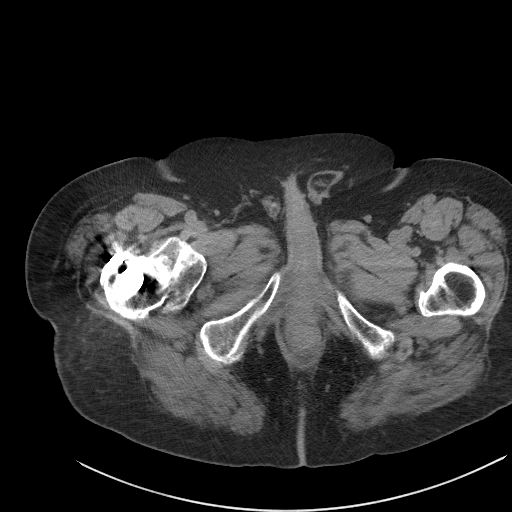
[im 22/99  soft-tissue]
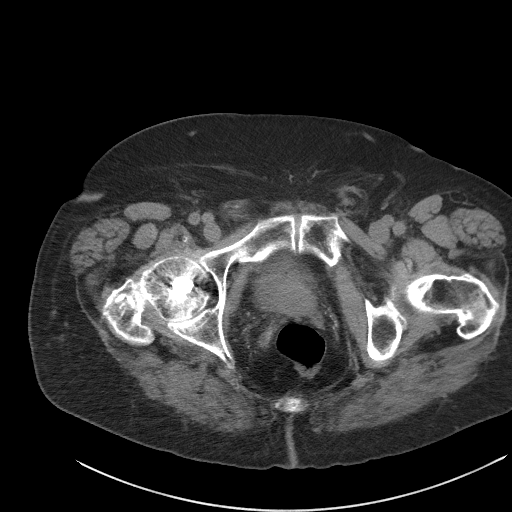
[im 26/99  soft-tissue]
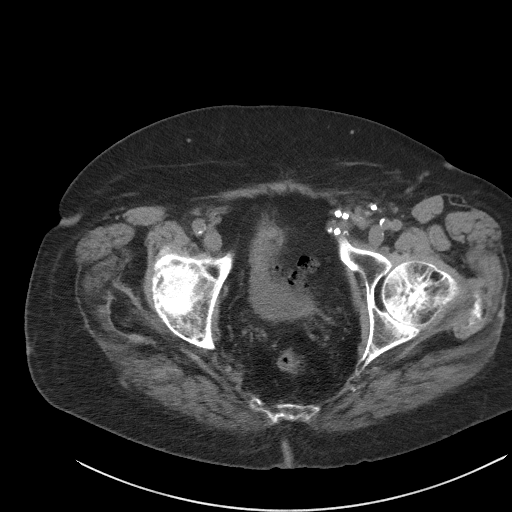
[im 35/99  soft-tissue]
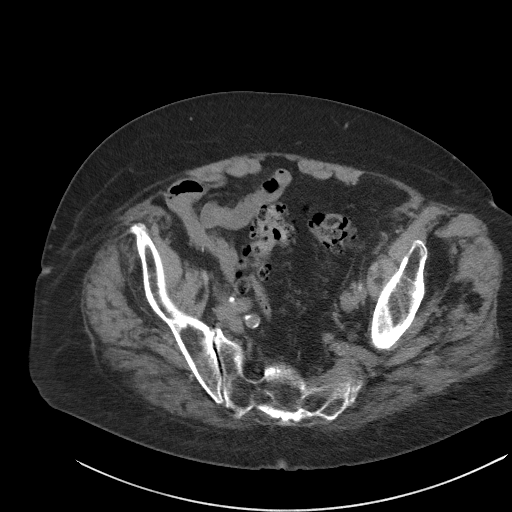
[im 43/99  soft-tissue]
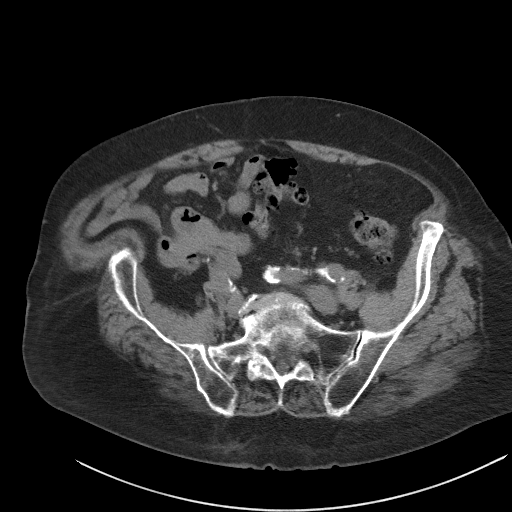
[im 52/99  soft-tissue]
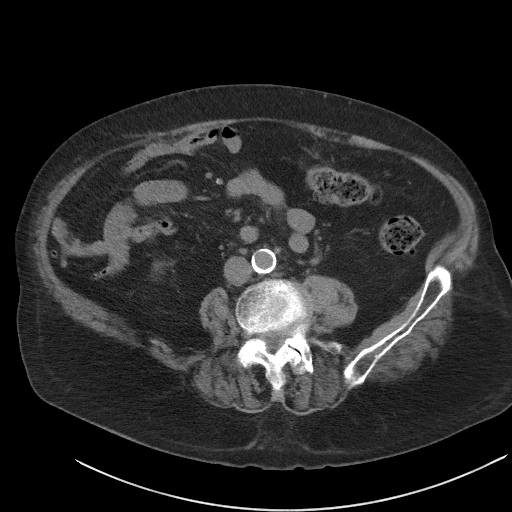
[im 56/99  soft-tissue]
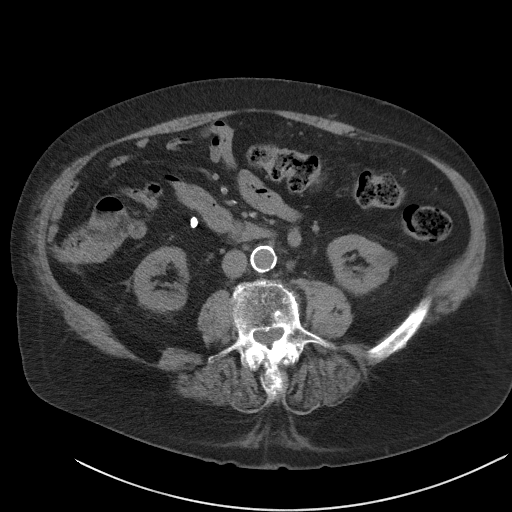
[im 64/99  soft-tissue]
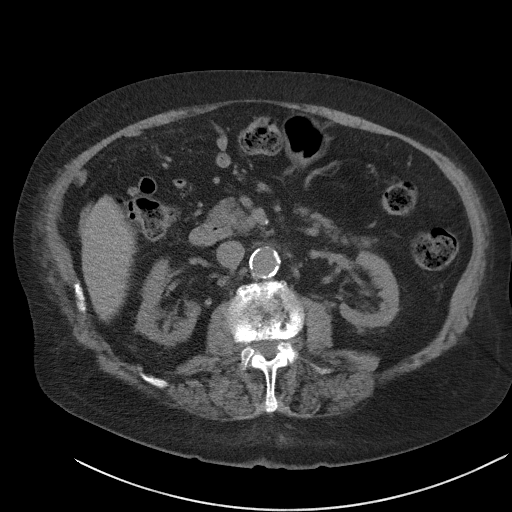
[im 64/99  bone]
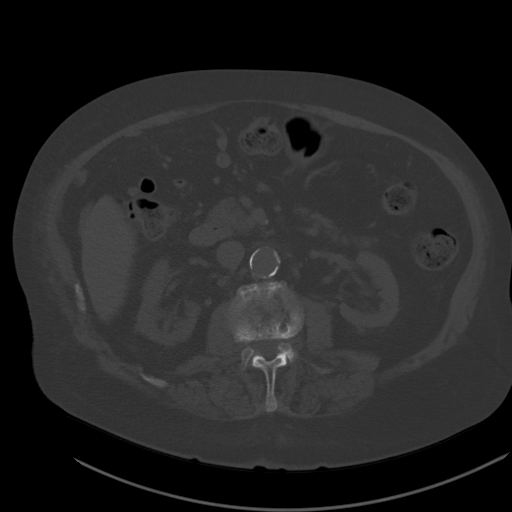
[im 73/99  soft-tissue]
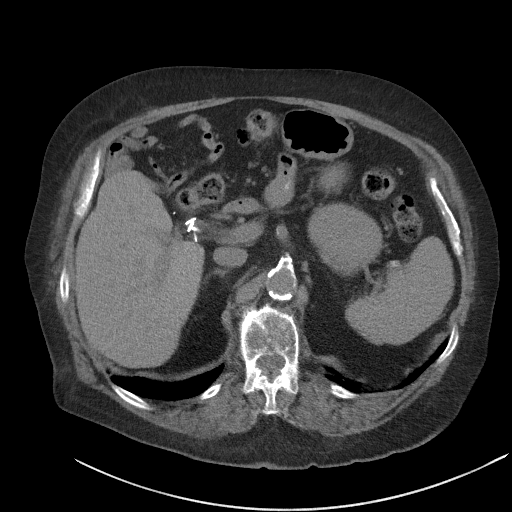
[im 77/99  soft-tissue]
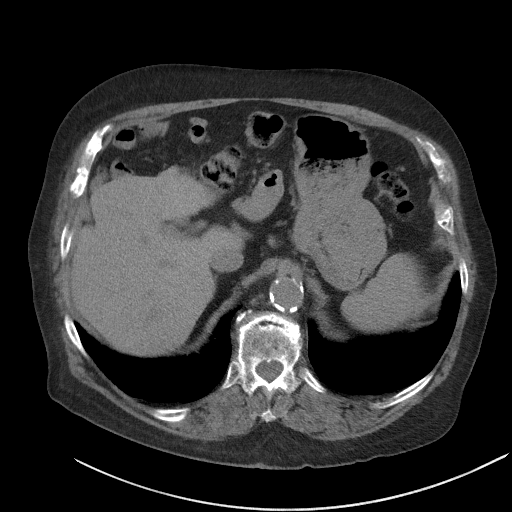
[im 86/99  soft-tissue]
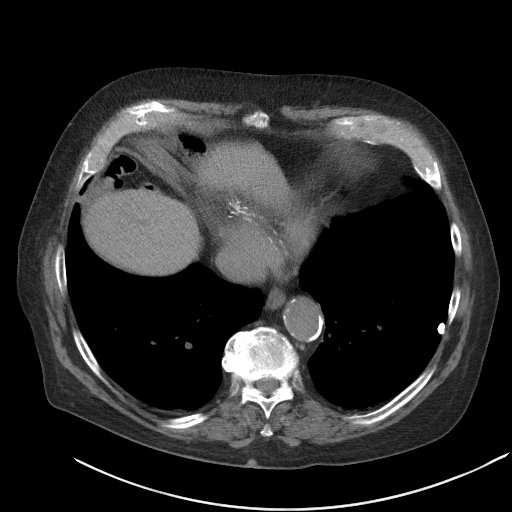
[im 94/99  soft-tissue]
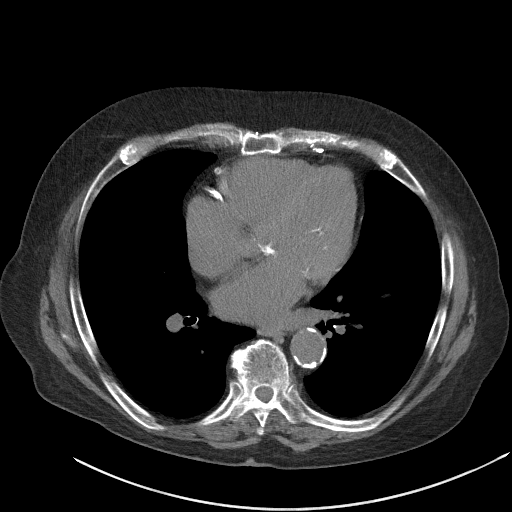

[Series 5: coronal st · coronal · 0.80mm/px · 3 of 108 slices shown]
[im 36/108  soft-tissue]
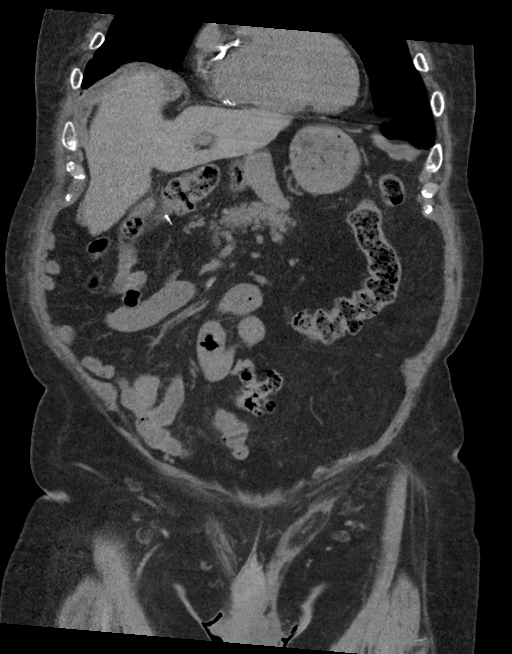
[im 48/108  soft-tissue]
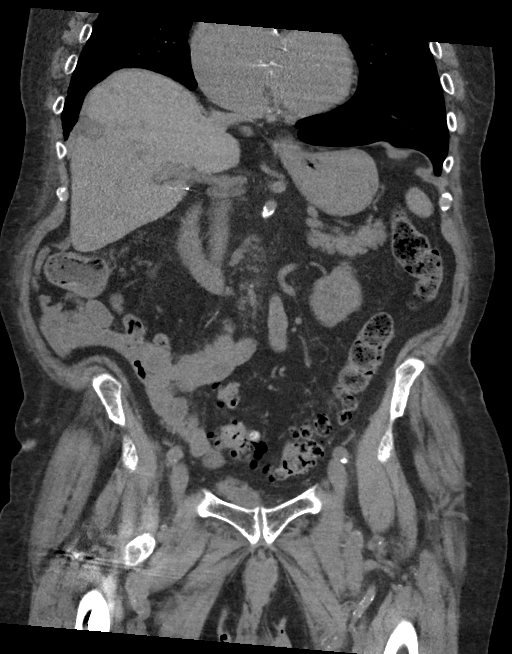
[im 60/108  soft-tissue]
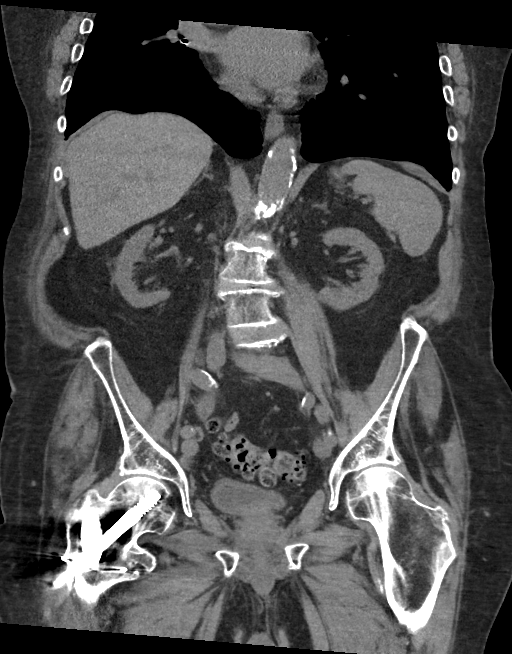

[16 of 46 positions shown; findings below may reference images not displayed]

FINDINGS: Evaluation of this exam is limited in the absence of intravenous
contrast.

Lower chest: The visualized lung bases are clear. There is 3 vessel
coronary vascular calcification.

No intra-abdominal free air or free fluid.

Hepatobiliary: The liver is unremarkable. No intrahepatic biliary
dilatation. Cholecystectomy.

Pancreas: Unremarkable. No pancreatic ductal dilatation or
surrounding inflammatory changes.

Spleen: Normal in size without focal abnormality.

Adrenals/Urinary Tract: The adrenal glands are unremarkable. There
is no hydronephrosis or nephrolithiasis on either side. Bilateral
renal hypodense lesions measure up to 2 cm in the upper pole of the
right flu and suboptimally characterized, likely cysts. The
visualized ureters and the urinary bladder appear unremarkable.

Stomach/Bowel: There is severe sigmoid diverticulosis without active
inflammatory changes. There is moderate stool throughout the colon.
There is no bowel obstruction or active inflammation. The appendix
is normal.

Vascular/Lymphatic: Advanced aortoiliac atherosclerotic disease. The
IVC is unremarkable. No portal venous gas. There is no adenopathy.

Reproductive: The prostate and seminal vesicles are grossly
unremarkable. No pelvic mass.

Other: None

Musculoskeletal: Osteopenia with degenerative changes of the spine.
Prior internal fixation of right femoral fracture. No acute osseous
pathology.
IMPRESSION: 1. No acute intra-abdominal or pelvic pathology.
2. Severe sigmoid diverticulosis. No bowel obstruction. Normal
appendix.
3. Aortic Atherosclerosis ([id number]).

## 2023-07-01 ENCOUNTER — Ambulatory Visit (INDEPENDENT_AMBULATORY_CARE_PROVIDER_SITE_OTHER)

## 2023-07-01 ENCOUNTER — Encounter: Payer: Self-pay | Admitting: Emergency Medicine

## 2023-07-01 ENCOUNTER — Ambulatory Visit
Admission: EM | Admit: 2023-07-01 | Discharge: 2023-07-01 | Disposition: A | Discharge status: Home / Self Care | Attending: Emergency Medicine | Admitting: Emergency Medicine

## 2023-07-01 DIAGNOSIS — M25512 Pain in left shoulder: Secondary | ICD-10-CM

## 2023-07-01 DIAGNOSIS — R0781 Pleurodynia: Secondary | ICD-10-CM | POA: Diagnosis not present

## 2023-07-01 DIAGNOSIS — G8929 Other chronic pain: Secondary | ICD-10-CM | POA: Diagnosis not present

## 2023-07-01 DIAGNOSIS — M19012 Primary osteoarthritis, left shoulder: Secondary | ICD-10-CM

## 2023-07-01 DIAGNOSIS — S20212A Contusion of left front wall of thorax, initial encounter: Secondary | ICD-10-CM

## 2023-07-01 NOTE — ED Triage Notes (Signed)
 Patient states that he was going to bed and lost his balance and fell onto his bed but rolled off the bed onto the floor.  Patient c/o left sided rib pain.  Patient denies hitting head and no LOC.  Patient is on a blood thinner.

## 2023-07-01 NOTE — ED Provider Notes (Signed)
 [facility]    CSN: [id number] Arrival date & time: 07/01/23  1243      History   Chief Complaint Chief Complaint  Patient presents with   Fall   Rib Pain    left    HPI Clayton Dentremont is a 89 y.o. male.   HPI  89 year old male with past medical his significant for atrial fibrillation hyperlipidemia, CAD, CHF, left artificial pneumothorax status post tuberculosis treatment, diabetes, hypertension, MI, CKD, PVD, and GERD presents for evaluation of pain in the left ribs after suffering a ground-level fall last night while at home.  He reports that he lost his balance, fell forward onto his bed and then rolled off the bed onto his left side where he landed on his phone which was in his pocket and thereby injured his left ribs.  He also suffered a fall 2 months ago which resulted in a fractured left wrist and he currently has a cast in place.  At that time he was not complaining of left shoulder pain but in the ensuing 2 months he has been complaining of left shoulder pain.  He has limited range of motion of his left arm at baseline.  He denies any cough or shortness of breath.  No bruising to his chest wall.  Past Medical History:  Diagnosis Date   Arthritis    Atrial fibrillation (HCC)    CHF (congestive heart failure) (HCC)    Chronic kidney disease    kidney stones   Coronary artery disease    Diabetes mellitus without complication (HCC)    Diverticulosis    GERD (gastroesophageal reflux disease)    Gout    Hyperlipidemia    Hypertension    Myocardial infarction [facility]) 2001   Peripheral vascular disease (HCC)    Pneumothorax, left 1950    Patient Active Problem List   Diagnosis Date Noted   Nondisplaced unspecified fracture of left great toe, initial encounter for closed fracture 10/08/2022   Osteopenia of left foot 10/08/2022   Hypotension    Syncope    Pre-syncope 09/14/2021   GERD (gastroesophageal reflux disease) 09/14/2021   Diarrhea 09/14/2021   Metal  foreign body in ear region 09/14/2021   Bilateral rotator cuff dysfunction 03/17/2021   History of rotator cuff surgery 03/17/2021   Lumbar facet arthropathy 03/17/2021   Lumbar degenerative disc disease 03/17/2021   Chronic pain syndrome 03/17/2021   Anemia of chronic disease 09/10/2019   Edema of both lower legs due to peripheral venous insufficiency 09/10/2019   Edema of both lower extremities due to peripheral venous insufficiency 09/10/2019   Acute on chronic combined systolic and diastolic CHF (congestive heart failure) (HCC) 05/23/2019   Acute pain of left shoulder 09/08/2015   CKD (chronic kidney disease), stage IV (HCC) 12/24/2014   Bradycardia 12/02/2014   TI (tricuspid incompetence) 10/21/2014   DD (diverticular disease) 07/30/2014   Essential (primary) hypertension 07/30/2014   Tuberculosis 07/30/2014   Hyperlipidemia 07/30/2014   Chronic systolic CHF (congestive heart failure), NYHA class 2 (HCC) 02/12/2014   AF (paroxysmal atrial fibrillation) (HCC) 02/12/2014   Paroxysmal A-fib (HCC) 02/12/2014   Accumulation of fluid in tissues 07/24/2013   Gout 07/24/2013   Diabetes mellitus, type 2 (HCC) 07/24/2013   Type 2 diabetes mellitus with renal manifestations, controlled (HCC) 07/24/2013   Peripheral blood vessel disorder (HCC) 07/19/2013   PVD (peripheral vascular disease) (HCC) 07/19/2013   Combined fat and carbohydrate induced hyperlipemia 05/21/2013   Encounter for therapeutic drug monitoring  12/18/2012   Chronic kidney disease (CKD), stage IV (severe) (HCC) 12/18/2012   Arteriosclerosis of coronary artery 12/18/2012   Decreased potassium in the blood 12/18/2012   H/O coronary artery bypass surgery 12/18/2012   H/O total knee replacement 12/18/2012   Encounter for therapeutic drug level monitoring 12/18/2012    Past Surgical History:  Procedure Laterality Date   CARDIAC CATHETERIZATION     CARDIAC SURGERY     4 bypass   CHOLECYSTECTOMY     cholecystectomy      CORONARY ARTERY BYPASS GRAFT     CYSTOSCOPY W/ RETROGRADES N/A 12/03/2014   Procedure: CYSTOSCOPY WITH ATTEMPT FOR RETROGRADE PYELOGRAM;  Surgeon: Bart Born, MD;  Location: [facility];  Service: Urology;  Laterality: N/A;   CYSTOSCOPY WITH LITHOLAPAXY N/A 12/03/2014   Procedure: CYSTOSCOPY WITH LITHOLAPAXY WITH HOLMIUM LASER ;  Surgeon: Bart Born, MD;  Location: [facility];  Service: Urology;  Laterality: N/A;   FEMUR SURGERY     GALLBLADDER SURGERY     HERNIA REPAIR     JOINT REPLACEMENT Right    Total Knee Replacement   LUNG SURGERY Left    REPLACEMENT TOTAL KNEE         Home Medications    Prior to Admission medications   Medication Sig Start Date End Date Taking? Authorizing Provider  acetaminophen  (TYLENOL ) 650 MG CR tablet Take 1,300 mg by mouth 2 (two) times daily.    [provider]  allopurinol  (ZYLOPRIM ) 300 MG tablet Take 150 mg by mouth daily.  05/09/14   [provider]  apixaban  (ELIQUIS ) 2.5 MG TABS tablet Take by mouth 2 (two) times daily.    [provider]  ferrous sulfate  325 (65 FE) MG tablet Take 1 tablet (325 mg total) by mouth daily. 09/12/19 11/29/19  Donaciano Frizzle, MD  finasteride  (PROSCAR ) 5 MG tablet Take 1 tablet (5 mg total) by mouth daily. 08/13/18   Lawerence Pressman, MD  guaiFENesin  (MUCINEX ) 600 MG 12 hr tablet Take 2 tablets (1,200 mg total) by mouth 2 (two) times daily as needed. 09/17/21   Armenta Landau, MD  ipratropium (ATROVENT) 0.03 % nasal spray Place 2 sprays into both nostrils 3 (three) times daily. 07/19/21   [provider]  loperamide  (IMODIUM ) 2 MG capsule Take 1 capsule (2 mg total) by mouth as needed for diarrhea or loose stools. 09/17/21   Armenta Landau, MD  loratadine  (CLARITIN ) 10 MG tablet Take 10 mg by mouth daily as needed. 07/16/19   [provider]  metoprolol  tartrate (LOPRESSOR ) 25 MG tablet Take 0.5 tablets (12.5 mg total) by mouth 2 (two) times daily. 09/17/21    Armenta Landau, MD  midodrine (PROAMATINE) 2.5 MG tablet Take 2.5 mg by mouth 2 (two) times daily.    [provider]  Multiple Vitamin (MULTI-VITAMINS) TABS Take 1 tablet by mouth daily.     [provider]  mupirocin ointment (BACTROBAN) 2 % Apply topically 3 (three) times daily. 07/19/21   [provider]  Omega-3 Fatty Acids (FISH OIL) 1000 MG CAPS Take 1 capsule by mouth daily.    [provider]  pantoprazole  (PROTONIX ) 40 MG tablet Take 40 mg by mouth daily.    [provider]  sertraline  (ZOLOFT ) 50 MG tablet Take 1 tablet by mouth daily. 06/17/21   [provider]  simvastatin  (ZOCOR ) 40 MG tablet Take 40 mg by mouth daily at 6 PM.  07/07/14   [provider]  tamsulosin  (  FLOMAX ) 0.4 MG CAPS capsule Take 1 capsule (0.4 mg total) by mouth daily. 09/20/21   Armenta Landau, MD  torsemide  (DEMADEX ) 20 MG tablet Take 1 tablet (20 mg total) by mouth daily. 09/19/21 12/29/22  Armenta Landau, MD  colchicine 0.6 MG tablet Take 0.6 mg by mouth as needed.   08/28/18  [provider]    Family History Family History  Problem Relation Age of Onset   Lung cancer Mother    Tuberculosis Brother     Social History Social History   Tobacco Use   Smoking status: Former    Types: Pipe    Quit date: 07/30/2006    Years since quitting: 16.9   Smokeless tobacco: Never  Vaping Use   Vaping status: Never Used  Substance Use Topics   Alcohol use: Not Currently    Alcohol/week: 14.0 standard drinks of alcohol    Types: 14 Glasses of wine per week    Comment: one glass of wine daily   Drug use: No     Allergies   Codeine, Gabapentin, Oxycodone, Morphine, and Tramadol    Review of Systems Review of Systems  Constitutional:  Negative for fever.  Respiratory:  Negative for cough and shortness of breath.   Cardiovascular:  Positive for chest pain.       Left anterior lower rib pain  Musculoskeletal:  Positive for  arthralgias. Negative for joint swelling.  Skin:  Negative for color change.     Physical Exam Triage Vital Signs ED Triage Vitals  Encounter Vitals Group     BP      Systolic BP Percentile      Diastolic BP Percentile      Pulse      Resp      Temp      Temp src      SpO2      Weight      Height      Head Circumference      Peak Flow      Pain Score      Pain Loc      Pain Education      Exclude from Growth Chart    No data found.  Updated Vital Signs BP 103/64 (BP Location: Left Arm)   Pulse 67   Temp 97.9 F (36.6 C) (Oral)   Resp 15   Ht 5\' 7"  (1.702 m)   Wt 194 lb (88 kg)   SpO2 95%   BMI 30.38 kg/m   Visual Acuity Right Eye Distance:   Left Eye Distance:   Bilateral Distance:    Right Eye Near:   Left Eye Near:    Bilateral Near:     Physical Exam Vitals and nursing note reviewed.  Constitutional:      Appearance: Normal appearance. He is not ill-appearing.  HENT:     Head: Normocephalic and atraumatic.  Cardiovascular:     Rate and Rhythm: Normal rate and regular rhythm.     Pulses: Normal pulses.     Heart sounds: Normal heart sounds. No murmur heard.    No friction rub. No gallop.  Pulmonary:     Effort: Pulmonary effort is normal.     Breath sounds: Normal breath sounds. No wheezing, rhonchi or rales.     Comments: Pain with palpation of the left 8th and 9th rib in the anterior axillary line.  No crepitus or overlying ecchymosis or edema.  Lungs are clear to auscultation in all fields.  Chest:     Chest wall: Tenderness present.  Musculoskeletal:        General: Tenderness and signs of injury present. No swelling.  Skin:    General: Skin is warm and dry.     Capillary Refill: Capillary refill takes less than 2 seconds.     Findings: No bruising or erythema.  Neurological:     General: No focal deficit present.     Mental Status: He is alert and oriented to person, place, and time.      UC Treatments / Results  Labs (all labs  ordered are listed, but only abnormal results are displayed) Labs Reviewed - No data to display  EKG   Radiology DG Ribs Unilateral W/Chest Left Result Date: 07/01/2023 CLINICAL DATA:  Left anterior chest pain since falling 1 month ago. EXAM: LEFT RIBS AND CHEST - 3+ VIEW; CHEST  1 VIEW COMPARISON:  Chest radiographs 03/04/2022 and 09/14/2021. FINDINGS: The heart size and mediastinal contours are stable with aortic atherosclerosis post median sternotomy and CABG. Stable calcified granulomas and calcified pleural plaque posteriorly on the left. No confluent airspace disease, pleural effusion or pneumothorax. The bones are demineralized. No acute rib fractures are identified. There is multilevel spondylosis. Advanced glenohumeral arthropathic changes are present bilaterally. IMPRESSION: 1. No evidence of acute rib fracture or pneumothorax. 2. Stable chest radiograph with evidence of prior granulomatous disease. 3. Advanced glenohumeral arthropathic changes bilaterally. Electronically Signed   By: Elmon Hagedorn M.D.   On: 07/01/2023 14:09   DG Chest 1 View Result Date: 07/01/2023 CLINICAL DATA:  Left anterior chest pain since falling 1 month ago. EXAM: LEFT RIBS AND CHEST - 3+ VIEW; CHEST  1 VIEW COMPARISON:  Chest radiographs 03/04/2022 and 09/14/2021. FINDINGS: The heart size and mediastinal contours are stable with aortic atherosclerosis post median sternotomy and CABG. Stable calcified granulomas and calcified pleural plaque posteriorly on the left. No confluent airspace disease, pleural effusion or pneumothorax. The bones are demineralized. No acute rib fractures are identified. There is multilevel spondylosis. Advanced glenohumeral arthropathic changes are present bilaterally. IMPRESSION: 1. No evidence of acute rib fracture or pneumothorax. 2. Stable chest radiograph with evidence of prior granulomatous disease. 3. Advanced glenohumeral arthropathic changes bilaterally. Electronically Signed   By:  Elmon Hagedorn M.D.   On: 07/01/2023 14:09   DG Shoulder Left Result Date: 07/01/2023 CLINICAL DATA:  Shoulder pain since falling 1 month ago. EXAM: LEFT SHOULDER - 2+ VIEW COMPARISON:  Radiographs 03/17/2021. FINDINGS: The bones are demineralized. Advanced glenohumeral arthropathy with joint space narrowing and osteophytes, similar to previous study. There are lesser acromioclavicular degenerative changes with possible erosion of the undersurface of the acromion. No evidence of acute fracture or dislocation. No acute soft tissue abnormalities are identified. IMPRESSION: No evidence of acute fracture or dislocation. Advanced glenohumeral arthropathy. Electronically Signed   By: Elmon Hagedorn M.D.   On: 07/01/2023 14:06    Procedures Procedures (including critical care time)  Medications Ordered in UC Medications - No data to display  Initial Impression / Assessment and Plan / UC Course  I have reviewed the triage vital signs and the nursing notes.  Pertinent labs & imaging results that were available during my care of the patient were reviewed by me and considered in my medical decision making (see chart for details).   Patient is a nontoxic-appearing 89 year old male presenting for evaluation of left rib and left shoulder pain status post fall as outlined HPI above.  He is here with his daughter  who reports that the fall happened last night when he was going to bed.  The patient reports that he just lost his balance and fell.  He does have a history of falls and fell 2 months ago resulting in a left wrist fracture.  The patient is on Eliquis  but he denies having struck his head and he denies any headache, changes in vision, dizziness, or forceful nausea or vomiting.  His only complaint is pain in the lower left ribs.  His daughter is concerned because he is also been experiencing pain in the left shoulder since suffering another ground-level fall 2 months ago.  She is requesting that his left  shoulder be x-rayed because at the time of the fall he was only complaining of pain in his left wrist and only had left wrist x-rays performed which did show a fracture.  Patient is in a short arm cast.  Patient is able speak in full sensible dyspnea tachypnea his lungs close additional fields.  There is no bruising to the anterior chest wall and no crepitus when palpating over the area of tenderness.  I will obtain a radiograph of the left ribs to rule out any fracture, hemothorax, or pneumothorax.  I will also obtain radiographs of the left shoulder to rule out any bony abnormality.  Radiology impression of left rib series and lateral chest x-ray states no evidence of acute rib fracture or pneumothorax.  Stable chest radiograph with evidence of prior granulomatous disease.  Advanced glenohumeral arthropathic changes bilaterally.  Radiology impression of left shoulder films states no evidence of fracture or dislocation.  Advanced glenohumeral arthropathy.  I will discharge patient with a diagnosis of left rib contusion and osteoarthritis of the left shoulder.  He may apply 2 g of Voltaren gel to his left shoulder 4 times a day as needed for pain.  For his chest wall he can take 1000 mg of Tylenol  every 6 hours as needed for pain and supplement with topical Salonpas patches or topical lidocaine  patches every 8 hours.  He may also apply ice to all the areas of discomfort for 20 minutes at a time, 2-3 times a day, to help with pain and inflammation.   Final Clinical Impressions(s) / UC Diagnoses   Final diagnoses:  Chronic left shoulder pain  Rib pain on left side  Contusion of ribs, left, initial encounter  Osteoarthritis of left shoulder, unspecified osteoarthritis type     Discharge Instructions      Your x-rays did not show any evidence of fracture, either in your ribs or in your shoulder.  You do have significant arthritis in your left shoulder however.  You may apply topical Voltaren gel,  2 g to your left shoulder every 6 hours, as needed for pain or inflammation.  You may apply topical Salonpas or over-the-counter topical lidocaine  patches to the area of pain on your left ribs every 8 hours.  Continue to take your Tylenol  twice daily.  You may also add a third dose on at midday for better pain control.  You may apply ice to all the areas of discomfort, or moist heat, for 20 minutes at a time, 2-3 times a day, to help with pain and inflammation.     ED Prescriptions   None    PDMP not reviewed this encounter.   Kent Pear, NP 07/01/23 [phone]

## 2023-07-01 NOTE — Discharge Instructions (Addendum)
 Your x-rays did not show any evidence of fracture, either in your ribs or in your shoulder.  You do have significant arthritis in your left shoulder however.  You may apply topical Voltaren gel, 2 g to your left shoulder every 6 hours, as needed for pain or inflammation.  You may apply topical Salonpas or over-the-counter topical lidocaine  patches to the area of pain on your left ribs every 8 hours.  Continue to take your Tylenol  twice daily.  You may also add a third dose on at midday for better pain control.  You may apply ice to all the areas of discomfort, or moist heat, for 20 minutes at a time, 2-3 times a day, to help with pain and inflammation.

## 2024-02-27 ENCOUNTER — Encounter: Payer: Self-pay | Admitting: Oncology

## 2024-02-27 ENCOUNTER — Inpatient Hospital Stay

## 2024-02-27 ENCOUNTER — Inpatient Hospital Stay: Attending: Oncology | Admitting: Oncology

## 2024-02-27 VITALS — BP 92/74 | HR 63 | Temp 95.9°F | Resp 19 | Ht 67.0 in | Wt 174.1 lb

## 2024-02-27 DIAGNOSIS — I5042 Chronic combined systolic (congestive) and diastolic (congestive) heart failure: Secondary | ICD-10-CM | POA: Insufficient documentation

## 2024-02-27 DIAGNOSIS — Z79899 Other long term (current) drug therapy: Secondary | ICD-10-CM | POA: Diagnosis not present

## 2024-02-27 DIAGNOSIS — M25512 Pain in left shoulder: Secondary | ICD-10-CM | POA: Insufficient documentation

## 2024-02-27 DIAGNOSIS — I071 Rheumatic tricuspid insufficiency: Secondary | ICD-10-CM | POA: Diagnosis not present

## 2024-02-27 DIAGNOSIS — M25511 Pain in right shoulder: Secondary | ICD-10-CM | POA: Insufficient documentation

## 2024-02-27 DIAGNOSIS — I4891 Unspecified atrial fibrillation: Secondary | ICD-10-CM | POA: Diagnosis not present

## 2024-02-27 DIAGNOSIS — Z7901 Long term (current) use of anticoagulants: Secondary | ICD-10-CM | POA: Insufficient documentation

## 2024-02-27 DIAGNOSIS — I252 Old myocardial infarction: Secondary | ICD-10-CM | POA: Diagnosis not present

## 2024-02-27 DIAGNOSIS — E119 Type 2 diabetes mellitus without complications: Secondary | ICD-10-CM | POA: Diagnosis not present

## 2024-02-27 DIAGNOSIS — D539 Nutritional anemia, unspecified: Secondary | ICD-10-CM | POA: Diagnosis not present

## 2024-02-27 DIAGNOSIS — E1122 Type 2 diabetes mellitus with diabetic chronic kidney disease: Secondary | ICD-10-CM | POA: Diagnosis not present

## 2024-02-27 DIAGNOSIS — K219 Gastro-esophageal reflux disease without esophagitis: Secondary | ICD-10-CM | POA: Insufficient documentation

## 2024-02-27 DIAGNOSIS — I129 Hypertensive chronic kidney disease with stage 1 through stage 4 chronic kidney disease, or unspecified chronic kidney disease: Secondary | ICD-10-CM | POA: Insufficient documentation

## 2024-02-27 DIAGNOSIS — I509 Heart failure, unspecified: Secondary | ICD-10-CM | POA: Insufficient documentation

## 2024-02-27 DIAGNOSIS — I739 Peripheral vascular disease, unspecified: Secondary | ICD-10-CM | POA: Insufficient documentation

## 2024-02-27 DIAGNOSIS — E1151 Type 2 diabetes mellitus with diabetic peripheral angiopathy without gangrene: Secondary | ICD-10-CM | POA: Insufficient documentation

## 2024-02-27 DIAGNOSIS — I130 Hypertensive heart and chronic kidney disease with heart failure and stage 1 through stage 4 chronic kidney disease, or unspecified chronic kidney disease: Secondary | ICD-10-CM | POA: Insufficient documentation

## 2024-02-27 DIAGNOSIS — D631 Anemia in chronic kidney disease: Secondary | ICD-10-CM | POA: Insufficient documentation

## 2024-02-27 DIAGNOSIS — M85872 Other specified disorders of bone density and structure, left ankle and foot: Secondary | ICD-10-CM | POA: Diagnosis not present

## 2024-02-27 DIAGNOSIS — E785 Hyperlipidemia, unspecified: Secondary | ICD-10-CM | POA: Diagnosis not present

## 2024-02-27 DIAGNOSIS — I2510 Atherosclerotic heart disease of native coronary artery without angina pectoris: Secondary | ICD-10-CM | POA: Diagnosis not present

## 2024-02-27 DIAGNOSIS — G894 Chronic pain syndrome: Secondary | ICD-10-CM | POA: Insufficient documentation

## 2024-02-27 DIAGNOSIS — N184 Chronic kidney disease, stage 4 (severe): Secondary | ICD-10-CM | POA: Insufficient documentation

## 2024-02-27 LAB — CBC WITH DIFFERENTIAL/PLATELET
Abs Immature Granulocytes: 0.02 K/uL (ref 0.00–0.07)
Basophils Absolute: 0 K/uL (ref 0.0–0.1)
Basophils Relative: 1 %
Eosinophils Absolute: 0.6 K/uL — ABNORMAL HIGH (ref 0.0–0.5)
Eosinophils Relative: 9 %
HCT: 28.8 % — ABNORMAL LOW (ref 39.0–52.0)
Hemoglobin: 9.4 g/dL — ABNORMAL LOW (ref 13.0–17.0)
Immature Granulocytes: 0 %
Lymphocytes Relative: 18 %
Lymphs Abs: 1.2 K/uL (ref 0.7–4.0)
MCH: 32.3 pg (ref 26.0–34.0)
MCHC: 32.6 g/dL (ref 30.0–36.0)
MCV: 99 fL (ref 80.0–100.0)
Monocytes Absolute: 0.6 K/uL (ref 0.1–1.0)
Monocytes Relative: 10 %
Neutro Abs: 4.1 K/uL (ref 1.7–7.7)
Neutrophils Relative %: 62 %
Platelets: 223 K/uL (ref 150–400)
RBC: 2.91 MIL/uL — ABNORMAL LOW (ref 4.22–5.81)
RDW: 15.9 % — ABNORMAL HIGH (ref 11.5–15.5)
WBC: 6.5 K/uL (ref 4.0–10.5)
nRBC: 0 % (ref 0.0–0.2)

## 2024-02-27 LAB — COMPREHENSIVE METABOLIC PANEL WITH GFR
ALT: 9 U/L (ref 0–44)
AST: 22 U/L (ref 15–41)
Albumin: 3.9 g/dL (ref 3.5–5.0)
Alkaline Phosphatase: 78 U/L (ref 38–126)
Anion gap: 9 (ref 5–15)
BUN: 48 mg/dL — ABNORMAL HIGH (ref 8–23)
CO2: 28 mmol/L (ref 22–32)
Calcium: 9.6 mg/dL (ref 8.9–10.3)
Chloride: 104 mmol/L (ref 98–111)
Creatinine, Ser: 2.29 mg/dL — ABNORMAL HIGH (ref 0.61–1.24)
GFR, Estimated: 26 mL/min — ABNORMAL LOW
Glucose, Bld: 102 mg/dL — ABNORMAL HIGH (ref 70–99)
Potassium: 4.4 mmol/L (ref 3.5–5.1)
Sodium: 141 mmol/L (ref 135–145)
Total Bilirubin: 0.4 mg/dL (ref 0.0–1.2)
Total Protein: 6.6 g/dL (ref 6.5–8.1)

## 2024-02-27 LAB — RETICULOCYTES
Immature Retic Fract: 7.5 % (ref 2.3–15.9)
RBC.: 2.92 MIL/uL — ABNORMAL LOW (ref 4.22–5.81)
Retic Count, Absolute: 30.4 K/uL (ref 19.0–186.0)
Retic Ct Pct: 1 % (ref 0.4–3.1)

## 2024-02-27 LAB — IRON AND TIBC
Iron: 62 ug/dL (ref 45–182)
Saturation Ratios: 27 % (ref 17.9–39.5)
TIBC: 234 ug/dL — ABNORMAL LOW (ref 250–450)
UIBC: 172 ug/dL

## 2024-02-27 LAB — VITAMIN B12: Vitamin B-12: 1044 pg/mL — ABNORMAL HIGH (ref 180–914)

## 2024-02-27 LAB — FOLATE: Folate: >20 ng/mL

## 2024-02-27 LAB — FERRITIN: Ferritin: 131 ng/mL (ref 24–336)

## 2024-02-27 LAB — TSH: TSH: 2.63 u[IU]/mL (ref 0.350–4.500)

## 2024-02-27 NOTE — Progress Notes (Signed)
 New patient; Referral from Dr. Marcelino: Anemia in CKD.

## 2024-02-27 NOTE — Progress Notes (Signed)
 "  Hematology/Oncology Consult note [facility] Telephone:([phone] [phone]  Patient Care Team: Jeffie Cheryl BRAVO, MD as PCP - General (Family Medicine) Claudene Lamarr Salm, NP as Nurse Practitioner (Hospice and Palliative Medicine) Steva Clotilda DEL, NP as Nurse Practitioner (Family Medicine) Marcelino Nurse, MD as Consulting Physician (Pain Medicine) Nida Rosina SAUNDERS, NP as Nurse Practitioner (Neurology)   Name of the patient: Carlos [name]  [id number]  1927-01-23    Reason for referral-anemia   Referring physician-Dr. Marcelino  Date of visit: 02/27/2024   History of presenting illness- Patient is a 89 year old male with a past medical history significant for hyperlipidemia hypertension, type 2 diabetes, peripheral arterial disease, stage IV CKD, history of heart failure among other medical problems.  He sees Dr. Marcelino for his CKD.Labs from 01/17/2024 showed white count of 7.5, H&H of 9.1/29.1 with an MCV of 101 and a platelet count of 263.  Looking back at his prior CBCs his hemoglobin was around 10.5 up until April 2024 and since then has gradually drifted down to the nines.  Patient is here with his daughter today.  He ambulates with the help of a walker at home and remains independent of his ADLs.  He has chronic bilateral shoulder pain.  ECOG PS- 2  Pain scale- 4   Review of systems- Review of Systems  Constitutional:  Positive for malaise/fatigue. Negative for chills, fever and weight loss.  HENT:  Negative for congestion, ear discharge and nosebleeds.   Eyes:  Negative for blurred vision.  Respiratory:  Negative for cough, hemoptysis, sputum production, shortness of breath and wheezing.   Cardiovascular:  Negative for chest pain, palpitations, orthopnea and claudication.  Gastrointestinal:  Negative for abdominal pain, blood in stool, constipation, diarrhea, heartburn, melena, nausea and vomiting.  Genitourinary:  Negative for dysuria,  flank pain, frequency, hematuria and urgency.  Musculoskeletal:  Positive for joint pain. Negative for back pain and myalgias.  Skin:  Negative for rash.  Neurological:  Negative for dizziness, tingling, focal weakness, seizures, weakness and headaches.  Endo/Heme/Allergies:  Does not bruise/bleed easily.  Psychiatric/Behavioral:  Negative for depression and suicidal ideas. The patient does not have insomnia.     Allergies[1]  Patient Active Problem List   Diagnosis Date Noted   Nondisplaced unspecified fracture of left great toe, initial encounter for closed fracture 10/08/2022   Osteopenia of left foot 10/08/2022   Hypotension    Syncope    Pre-syncope 09/14/2021   GERD (gastroesophageal reflux disease) 09/14/2021   Diarrhea 09/14/2021   Metal foreign body in ear region 09/14/2021   Bilateral rotator cuff dysfunction 03/17/2021   History of rotator cuff surgery 03/17/2021   Lumbar facet arthropathy 03/17/2021   Lumbar degenerative disc disease 03/17/2021   Chronic pain syndrome 03/17/2021   Anemia of chronic disease 09/10/2019   Edema of both lower legs due to peripheral venous insufficiency 09/10/2019   Edema of both lower extremities due to peripheral venous insufficiency 09/10/2019   Acute on chronic combined systolic and diastolic CHF (congestive heart failure) (HCC) 05/23/2019   Acute pain of left shoulder 09/08/2015   CKD (chronic kidney disease), stage IV (HCC) 12/24/2014   Bradycardia 12/02/2014   TI (tricuspid incompetence) 10/21/2014   DD (diverticular disease) 07/30/2014   Essential (primary) hypertension 07/30/2014   Tuberculosis 07/30/2014   Hyperlipidemia 07/30/2014   Chronic systolic CHF (congestive heart failure), NYHA class 2 (HCC) 02/12/2014   AF (paroxysmal atrial fibrillation) (HCC) 02/12/2014   Paroxysmal A-fib (HCC) 02/12/2014  Accumulation of fluid in tissues 07/24/2013   Gout 07/24/2013   Diabetes mellitus, type 2 (HCC) 07/24/2013   Type 2  diabetes mellitus with renal manifestations, controlled (HCC) 07/24/2013   Peripheral blood vessel disorder 07/19/2013   PVD (peripheral vascular disease) 07/19/2013   Combined fat and carbohydrate induced hyperlipemia 05/21/2013   Encounter for therapeutic drug monitoring 12/18/2012   Chronic kidney disease (CKD), stage IV (severe) (HCC) 12/18/2012   Arteriosclerosis of coronary artery 12/18/2012   Decreased potassium in the blood 12/18/2012   H/O coronary artery bypass surgery 12/18/2012   H/O total knee replacement 12/18/2012   Encounter for therapeutic drug level monitoring 12/18/2012     Past Medical History:  Diagnosis Date   Arthritis    Atrial fibrillation (HCC)    CHF (congestive heart failure) (HCC)    Chronic kidney disease    kidney stones   Coronary artery disease    Diabetes mellitus without complication (HCC)    Diverticulosis    GERD (gastroesophageal reflux disease)    Gout    Hyperlipidemia    Hypertension    Myocardial infarction (HCC) 2001   Peripheral vascular disease    Pneumothorax, left 1950     Past Surgical History:  Procedure Laterality Date   CARDIAC CATHETERIZATION     CARDIAC SURGERY     4 bypass   CHOLECYSTECTOMY     cholecystectomy     CORONARY ARTERY BYPASS GRAFT     CYSTOSCOPY W/ RETROGRADES N/A 12/03/2014   Procedure: CYSTOSCOPY WITH ATTEMPT FOR RETROGRADE PYELOGRAM;  Surgeon: Redell Lynwood Napoleon, MD;  Location: [facility];  Service: Urology;  Laterality: N/A;   CYSTOSCOPY WITH LITHOLAPAXY N/A 12/03/2014   Procedure: CYSTOSCOPY WITH LITHOLAPAXY WITH HOLMIUM LASER ;  Surgeon: Redell Lynwood Napoleon, MD;  Location: [facility];  Service: Urology;  Laterality: N/A;   FEMUR SURGERY     GALLBLADDER SURGERY     HERNIA REPAIR     JOINT REPLACEMENT Right    Total Knee Replacement   LUNG SURGERY Left    REPLACEMENT TOTAL KNEE      Social History   Socioeconomic History   Marital status: Widowed    Spouse name: Not on file   Number of  children: 3   Years of education: Not on file   Highest education level: Not on file  Occupational History   Not on file  Tobacco Use   Smoking status: Former    Types: Pipe    Quit date: 07/30/2006    Years since quitting: 17.5   Smokeless tobacco: Never  Vaping Use   Vaping status: Never Used  Substance and Sexual Activity   Alcohol use: Not Currently    Alcohol/week: 14.0 standard drinks of alcohol    Types: 14 Glasses of wine per week    Comment: one glass of wine daily   Drug use: No   Sexual activity: Not on file  Other Topics Concern   Not on file  Social History Narrative   Not on file   Social Drivers of Health   Tobacco Use: Medium Risk (02/27/2024)   Patient History    Smoking Tobacco Use: Former    Smokeless Tobacco Use: Never    Passive Exposure: Not on file  Financial Resource Strain: Low Risk  (03/15/2023)   Received from [facility]   Overall Financial Resource Strain (CARDIA)    Difficulty of Paying Living Expenses: Not hard at all  Food Insecurity: No Food Insecurity (02/27/2024)  Epic    Worried About Programme Researcher, Broadcasting/film/video in the Last Year: Never true    [employer] of Food in the Last Year: Never true  Transportation Needs: No Transportation Needs (02/27/2024)   Epic    Lack of Transportation (Medical): No    Lack of Transportation (Non-Medical): No  Physical Activity: Not on file  Stress: Not on file  Social Connections: Not on file  Intimate Partner Violence: Not At Risk (02/27/2024)   Epic    Fear of Current or Ex-Partner: No    Emotionally Abused: No    Physically Abused: No    Sexually Abused: No  Depression (PHQ2-9): Low Risk (02/27/2024)   Depression (PHQ2-9)    PHQ-2 Score: 0  Alcohol Screen: Not on file  Housing: Low Risk (02/27/2024)   Epic    Unable to Pay for Housing in the Last Year: No    Number of Times Moved in the Last Year: 0    Homeless in the Last Year: No  Utilities: Not At Risk (02/27/2024)   Epic     Threatened with loss of utilities: No  Health Literacy: Not on file     Family History  Problem Relation Age of Onset   Lung cancer Mother    Dementia Sister    Tuberculosis Brother    Lung cancer Brother     Current Medications[2]   Physical exam:  Vitals:   02/27/24 1400 02/27/24 1405  BP: (!) 83/42 92/74  Pulse: 63   Resp: 19   Temp: (!) 95.9 F (35.5 C)   TempSrc: Tympanic   SpO2: 99%   Weight: 174 lb 1.6 oz (79 kg)   Height: 5' 7 (1.702 m)    Physical Exam Constitutional:      Comments: Sitting in a wheelchair.  Appears in no acute distress.  Appears frail  Cardiovascular:     Rate and Rhythm: Normal rate and regular rhythm.     Heart sounds: Normal heart sounds.  Pulmonary:     Effort: Pulmonary effort is normal.     Breath sounds: Normal breath sounds.  Abdominal:     General: Bowel sounds are normal.     Palpations: Abdomen is soft.  Skin:    General: Skin is warm and dry.  Neurological:     Mental Status: He is alert and oriented to person, place, and time.           Latest Ref Rng & Units 09/17/2021    5:32 AM  CMP  Glucose 70 - 99 mg/dL 88   BUN 8 - 23 mg/dL 30   Creatinine 9.38 - 1.24 mg/dL 7.96   Sodium 864 - 854 mmol/L 142   Potassium 3.5 - 5.1 mmol/L 3.7   Chloride 98 - 111 mmol/L 115   CO2 22 - 32 mmol/L 23   Calcium 8.9 - 10.3 mg/dL 9.2       Latest Ref Rng & Units 09/17/2021    5:32 AM  CBC  WBC 4.0 - 10.5 K/uL 6.0   Hemoglobin 13.0 - 17.0 g/dL 8.9   Hematocrit 60.9 - 52.0 % 27.8   Platelets 150 - 400 K/uL 176      Assessment and plan- Patient is a 89 y.o. male referred for anemia of chronic kidney disease  Assessment and Plan    Anemia in chronic kidney disease Chronic moderate anemia with declining hemoglobin over 1.5 years, likely due to stage 4 CKD and possible nutritional deficiencies. Differential includes  nutritional deficiency, inadequate erythropoietin production, multiple myeloma, and hemolysis. Oral iron not  tolerated. Further evaluation needed before erythropoiesis-stimulating agents or IV iron. Bone marrow biopsy deferred due to age and risk. - Ordered labs for iron, vitamin B12, folic acid, thyroid function, hemolysis, and multiple myeloma screening. - Review lab results in two weeks to determine etiology and guide therapy. - Consider IV iron infusion if iron deficiency identified; discussed rare risk of allergic reaction and formulation dependent on insurance. - Target hemoglobin 10-11 g/dL with erythropoietin injections every three weeks if indicated; discussed risk of stroke and myocardial infarction with higher targets. - Consider further evaluation for bone marrow failure syndromes if iron and erythropoietin ineffective; bone marrow biopsy not planned.         Thank you for this kind referral and the opportunity to participate in the care of this  Patient   Visit Diagnosis 1. Macrocytic anemia     Dr. Annah Skene, MD, MPH [facility] [phone] 02/27/2024                   [1]  Allergies Allergen Reactions   Codeine    Gabapentin     Other reaction(s): Hallucination   Oxycodone Other (See Comments)   Morphine Other (See Comments)    nightmares (Patient had Morphine and tolerated it fine)   Tramadol    [2]  Current Outpatient Medications:    acetaminophen  (TYLENOL ) 650 MG CR tablet, Take 1,300 mg by mouth 2 (two) times daily., Disp: , Rfl:    allopurinol  (ZYLOPRIM ) 300 MG tablet, Take 150 mg by mouth daily. , Disp: , Rfl:    apixaban  (ELIQUIS ) 2.5 MG TABS tablet, Take by mouth 2 (two) times daily., Disp: , Rfl:    finasteride  (PROSCAR ) 5 MG tablet, Take 1 tablet (5 mg total) by mouth daily., Disp: 90 tablet, Rfl: 3   guaiFENesin  (MUCINEX ) 600 MG 12 hr tablet, Take 2 tablets (1,200 mg total) by mouth 2 (two) times daily as needed., Disp: , Rfl:    ipratropium (ATROVENT) 0.03 % nasal spray, Place 2 sprays into both nostrils 3  (three) times daily., Disp: , Rfl:    loperamide  (IMODIUM ) 2 MG capsule, Take 1 capsule (2 mg total) by mouth as needed for diarrhea or loose stools., Disp: 20 capsule, Rfl: 0   loratadine  (CLARITIN ) 10 MG tablet, Take 10 mg by mouth daily as needed., Disp: , Rfl:    metoprolol  tartrate (LOPRESSOR ) 25 MG tablet, Take 0.5 tablets (12.5 mg total) by mouth 2 (two) times daily., Disp: 30 tablet, Rfl: 1   midodrine (PROAMATINE) 2.5 MG tablet, Take 2.5 mg by mouth 2 (two) times daily., Disp: , Rfl:    Multiple Vitamin (MULTI-VITAMINS) TABS, Take 1 tablet by mouth daily. , Disp: , Rfl:    mupirocin ointment (BACTROBAN) 2 %, Apply topically 3 (three) times daily., Disp: , Rfl:    pantoprazole  (PROTONIX ) 40 MG tablet, Take 40 mg by mouth daily., Disp: , Rfl:    sertraline  (ZOLOFT ) 50 MG tablet, Take 1 tablet by mouth daily., Disp: , Rfl:    simvastatin  (ZOCOR ) 40 MG tablet, Take 40 mg by mouth daily at 6 PM. , Disp: , Rfl:    tamsulosin  (FLOMAX ) 0.4 MG CAPS capsule, Take 1 capsule (0.4 mg total) by mouth daily., Disp: 90 capsule, Rfl: 3   torsemide  (DEMADEX ) 20 MG tablet, Take 1 tablet (20 mg total) by mouth daily., Disp: 30 tablet, Rfl: 0   diphenhydrAMINE (BENADRYL  ALLERGY) 25 MG tablet, Take 25 mg by mouth., Disp: , Rfl:    ferrous sulfate  325 (65 FE) MG tablet, Take 1 tablet (325 mg total) by mouth daily. (Patient not taking: Reported on 02/27/2024), Disp: 30 tablet, Rfl: 0   Omega-3 Fatty Acids (FISH OIL) 1000 MG CAPS, Take 1 capsule by mouth daily. (Patient not taking: Reported on 02/27/2024), Disp: , Rfl:   "

## 2024-02-28 LAB — KAPPA/LAMBDA LIGHT CHAINS
Kappa free light chain: 67.6 mg/L — ABNORMAL HIGH (ref 3.3–19.4)
Kappa, lambda light chain ratio: 1.78 — ABNORMAL HIGH (ref 0.26–1.65)
Lambda free light chains: 38 mg/L — ABNORMAL HIGH (ref 5.7–26.3)

## 2024-02-28 LAB — HAPTOGLOBIN: Haptoglobin: 114 mg/dL (ref 38–329)

## 2024-03-01 LAB — MULTIPLE MYELOMA PANEL, SERUM
Albumin SerPl Elph-Mcnc: 3.2 g/dL (ref 2.9–4.4)
Albumin/Glob SerPl: 1.3 (ref 0.7–1.7)
Alpha 1: 0.3 g/dL (ref 0.0–0.4)
Alpha2 Glob SerPl Elph-Mcnc: 0.7 g/dL (ref 0.4–1.0)
B-Globulin SerPl Elph-Mcnc: 0.8 g/dL (ref 0.7–1.3)
Gamma Glob SerPl Elph-Mcnc: 0.9 g/dL (ref 0.4–1.8)
Globulin, Total: 2.6 g/dL (ref 2.2–3.9)
IgA: 168 mg/dL (ref 61–437)
IgG (Immunoglobin G), Serum: 944 mg/dL (ref 603–1613)
IgM (Immunoglobulin M), Srm: 136 mg/dL (ref 15–143)
M Protein SerPl Elph-Mcnc: 0.2 g/dL — ABNORMAL HIGH
Total Protein ELP: 5.8 g/dL — ABNORMAL LOW (ref 6.0–8.5)

## 2024-03-19 ENCOUNTER — Encounter: Payer: Self-pay | Admitting: Oncology

## 2024-03-19 ENCOUNTER — Other Ambulatory Visit: Payer: Self-pay | Admitting: Oncology

## 2024-03-19 ENCOUNTER — Inpatient Hospital Stay

## 2024-03-19 ENCOUNTER — Inpatient Hospital Stay: Attending: Oncology | Admitting: Oncology

## 2024-03-19 VITALS — BP 99/44 | HR 44 | Temp 96.7°F | Resp 19 | Ht 67.0 in | Wt 172.8 lb

## 2024-03-19 DIAGNOSIS — D539 Nutritional anemia, unspecified: Secondary | ICD-10-CM

## 2024-03-19 NOTE — Progress Notes (Unsigned)
 Patient doing good; no new or acute concerns at this time.

## 2024-03-21 ENCOUNTER — Encounter: Payer: Self-pay | Admitting: Oncology

## 2024-03-21 NOTE — Progress Notes (Signed)
 "    Hematology/Oncology Consult note [facility]  [phone] [phone]  Patient Care Team: Jeffie Cheryl BRAVO, MD as PCP - General (Family Medicine) Claudene Lamarr Salm, NP as Nurse Practitioner (Hospice and Palliative Medicine) Steva Clotilda DEL, NP as Nurse Practitioner (Family Medicine) Marcelino Nurse, MD as Consulting Physician (Pain Medicine) Nida Rosina SAUNDERS, NP as Nurse Practitioner (Neurology)   Name of the patient: Carlos [name]  [id number]  September 10, 1926   Date of visit: 03/21/24  Diagnosis- anemia of chronic kidney disease  Chief complaint/ Reason for visit- routine f/u of anemia  Heme/Onc history: Patient is a 89 year old male with a past medical history significant for hyperlipidemia hypertension, type 2 diabetes, peripheral arterial disease, stage IV CKD, history of heart failure among other medical problems.  He sees Dr. Marcelino for his CKD.Labs from 01/17/2024 showed white count of 7.5, H&H of 9.1/29.1 with an MCV of 101 and a platelet count of 263.  Looking back at his prior CBCs his hemoglobin was around 10.5 up until April 2024 and since then has gradually drifted down to the nines.   Results of anemia workup Up from 02/27/2024 are as follows: CBC showed white cell count of 6.5, H&H of 9.4/28.8 with an MCV of 99 and a platelet count of 223.  B12 levels were normal at 1044.  CMP showed creatinine of 2.29 with a normal calcium and total protein.  Ferritin levels were normal at 131.  Folate normal.  Myeloma panel showed a 0.2 g of IgM paraprotein.  TSH haptoglobin normal.  Both kappa and lambda light chains were elevated with a serum free light chain ratio 1.78.  Iron studies showed an iron saturation of 27%  Interval history- Carlos [name] is a 89 year old male with stage 4 chronic kidney disease and chronic anemia who presents for hematology follow-up regarding persistent anemia.  Hemoglobin has persistently been in the 9-9.5 g/dL  range since May 7974. Previously, hemoglobin was more consistently around 10 g/dL. The most recent hemoglobin was 9.4 g/dL on February 27, 2024, and 9.1 g/dL in November 2025, without significant upward or downward trend over the past 6-7 months. He has no new symptoms or changes in his condition at this visit.  Evaluation for the etiology of anemia has included iron studies, which demonstrate ferritin greater than 100 and iron saturation above 20%, indicating adequate iron stores. Vitamin B12, folic acid , and thyroid  function are within normal limits. He previously experienced significant constipation with oral iron supplementation, and iron infusions have not been administered as he is not iron deficient.  Chronic kidney disease remains stable, with kidney function previously declining to 20-24 mL/min/1.24m2 but never requiring dialysis. He has a history of multiple myocardial infarctions.       ECOG PS- 2 Pain scale- 0   Review of systems- Review of Systems  Constitutional:  Positive for malaise/fatigue. Negative for chills, fever and weight loss.  HENT:  Negative for congestion, ear discharge and nosebleeds.   Eyes:  Negative for blurred vision.  Respiratory:  Negative for cough, hemoptysis, sputum production, shortness of breath and wheezing.   Cardiovascular:  Negative for chest pain, palpitations, orthopnea and claudication.  Gastrointestinal:  Negative for abdominal pain, blood in stool, constipation, diarrhea, heartburn, melena, nausea and vomiting.  Genitourinary:  Negative for dysuria, flank pain, frequency, hematuria and urgency.  Musculoskeletal:  Negative for back pain, joint pain and myalgias.  Skin:  Negative for rash.  Neurological:  Negative for dizziness, tingling, focal weakness, seizures,  weakness and headaches.  Endo/Heme/Allergies:  Does not bruise/bleed easily.  Psychiatric/Behavioral:  Negative for depression and suicidal ideas. The patient does not have insomnia.        Allergies[1]   Past Medical History:  Diagnosis Date   Arthritis    Atrial fibrillation (HCC)    CHF (congestive heart failure) (HCC)    Chronic kidney disease    kidney stones   Coronary artery disease    Diabetes mellitus without complication (HCC)    Diverticulosis    GERD (gastroesophageal reflux disease)    Gout    Hyperlipidemia    Hypertension    Myocardial infarction [facility]) 2001   Peripheral vascular disease    Pneumothorax, left 1950     Past Surgical History:  Procedure Laterality Date   CARDIAC CATHETERIZATION     CARDIAC SURGERY     4 bypass   CHOLECYSTECTOMY     cholecystectomy     CORONARY ARTERY BYPASS GRAFT     CYSTOSCOPY W/ RETROGRADES N/A 12/03/2014   Procedure: CYSTOSCOPY WITH ATTEMPT FOR RETROGRADE PYELOGRAM;  Surgeon: Redell Lynwood Napoleon, MD;  Location: [facility] ORS;  Service: Urology;  Laterality: N/A;   CYSTOSCOPY WITH LITHOLAPAXY N/A 12/03/2014   Procedure: CYSTOSCOPY WITH LITHOLAPAXY WITH HOLMIUM LASER ;  Surgeon: Redell Lynwood Napoleon, MD;  Location: [facility] ORS;  Service: Urology;  Laterality: N/A;   FEMUR SURGERY     GALLBLADDER SURGERY     HERNIA REPAIR     JOINT REPLACEMENT Right    Total Knee Replacement   LUNG SURGERY Left    REPLACEMENT TOTAL KNEE      Social History   Socioeconomic History   Marital status: Widowed    Spouse name: Not on file   Number of children: 3   Years of education: Not on file   Highest education level: Not on file  Occupational History   Not on file  Tobacco Use   Smoking status: Former    Types: Pipe    Quit date: 07/30/2006    Years since quitting: 17.6   Smokeless tobacco: Never  Vaping Use   Vaping status: Never Used  Substance and Sexual Activity   Alcohol use: Not Currently    Alcohol/week: 14.0 standard drinks of alcohol    Types: 14 Glasses of wine per week    Comment: one glass of wine daily   Drug use: No   Sexual activity: Not on file  Other Topics Concern   Not on file  Social History  Narrative   Not on file   Social Drivers of Health   Tobacco Use: Medium Risk (03/19/2024)   Patient History    Smoking Tobacco Use: Former    Smokeless Tobacco Use: Never    Passive Exposure: Not on Actuary Strain: Low Risk  (03/12/2024)   Received from [facility]   Overall Financial Resource Strain (CARDIA)    Difficulty of Paying Living Expenses: Not hard at all  Food Insecurity: No Food Insecurity (03/12/2024)   Received from [facility]   Epic    Within the past 12 months, you worried that your food would run out before you got the money to buy more.: Never true    Within the past 12 months, the food you bought just didn't last and you didn't have money to get more.: Never true  Transportation Needs: No Transportation Needs (03/12/2024)   Received from [facility]   [facility] - [facility]  In the past 12 months, has lack of transportation kept you from medical appointments or from getting medications?: No    Lack of Transportation (Non-Medical): No  Physical Activity: Not on file  Stress: Not on file  Social Connections: Not on file  Intimate Partner Violence: Not At Risk (02/27/2024)   Epic    Fear of Current or Ex-Partner: No    Emotionally Abused: No    Physically Abused: No    Sexually Abused: No  Depression (PHQ2-9): Low Risk (03/19/2024)   Depression (PHQ2-9)    PHQ-2 Score: 0  Alcohol Screen: Not on file  Housing: Low Risk  (03/12/2024)   Received from [facility]   Epic    In the last 12 months, was there a time when you were not able to pay the mortgage or rent on time?: No    In the past 12 months, how many times have you moved where you were living?: 0    At any time in the past 12 months, were you homeless or living in a shelter (including now)?: No  Utilities: Not At Risk (03/12/2024)   Received from [facility]   Epic    In the past 12 months has  the electric, gas, oil, or water company threatened to shut off services in your home?: No  Health Literacy: Not on file    Family History  Problem Relation Age of Onset   Lung cancer Mother    Dementia Sister    Tuberculosis Brother    Lung cancer Brother     Current Medications[2]  Physical exam:  Vitals:   03/19/24 1408  BP: (!) 99/44  Pulse: (!) 44  Resp: 19  Temp: (!) 96.7 F (35.9 C)  TempSrc: Tympanic  SpO2: 98%  Weight: 172 lb 12.8 oz (78.4 kg)  Height: 5' 7 (1.702 m)   Physical Exam Constitutional:      Comments: Sitting in a wheelchair.  Appears in no acute distress  Cardiovascular:     Rate and Rhythm: Normal rate and regular rhythm.     Heart sounds: Normal heart sounds.  Pulmonary:     Effort: Pulmonary effort is normal.     Breath sounds: Normal breath sounds.  Skin:    General: Skin is warm and dry.  Neurological:     Mental Status: He is alert and oriented to person, place, and time.      I have personally reviewed labs listed below:    Latest Ref Rng & Units 02/27/2024    2:31 PM  CMP  Glucose 70 - 99 mg/dL 897   BUN 8 - 23 mg/dL 48   Creatinine 9.38 - 1.24 mg/dL 7.70   Sodium 864 - 854 mmol/L 141   Potassium 3.5 - 5.1 mmol/L 4.4   Chloride 98 - 111 mmol/L 104   CO2 22 - 32 mmol/L 28   Calcium 8.9 - 10.3 mg/dL 9.6   Total Protein 6.5 - 8.1 g/dL 6.6   Total Bilirubin 0.0 - 1.2 mg/dL 0.4   Alkaline Phos 38 - 126 U/L 78   AST 15 - 41 U/L 22   ALT 0 - 44 U/L 9       Latest Ref Rng & Units 02/27/2024    2:31 PM  CBC  WBC 4.0 - 10.5 K/uL 6.5   Hemoglobin 13.0 - 17.0 g/dL 9.4   Hematocrit 60.9 - 52.0 % 28.8   Platelets 150 - 400 K/uL 223  Assessment and plan- Patient is a 89 y.o. male here for routine follow-up of anemia of chronic kidney disease  Assessment and Plan    Anemia of chronic kidney disease Chronic anemia due to chronic kidney disease with stable hemoglobin 9-9.5 g/dL. No iron, B12, folate, or thyroid   deficiency. Stable renal function. EPO therapy discussed but deferred due to myocardial infarction risk unless hemoglobin <9 g/dL. - Monitor hemoglobin and labs every two months. - Assess hemoglobin at each visit for EPO therapy need. - Initiate EPO if hemoglobin <9 g/dL. - Conduct clinic-based blood work for immediate EPO initiation if needed. - Schedule follow-up in six months or earlier if labs indicate.         Visit Diagnosis 1. Macrocytic anemia      Dr. Annah Skene, MD, MPH [facility] at [facility] [phone] 03/21/2024 2:14 PM                   [1]  Allergies Allergen Reactions   Codeine    Gabapentin     Other reaction(s): Hallucination   Oxycodone Other (See Comments)   Morphine Other (See Comments)    nightmares (Patient had Morphine and tolerated it fine)   Tramadol    [2]  Current Outpatient Medications:    acetaminophen  (TYLENOL ) 650 MG CR tablet, Take 1,300 mg by mouth 2 (two) times daily., Disp: , Rfl:    allopurinol  (ZYLOPRIM ) 300 MG tablet, Take 150 mg by mouth daily. , Disp: , Rfl:    apixaban  (ELIQUIS ) 2.5 MG TABS tablet, Take by mouth 2 (two) times daily., Disp: , Rfl:    diphenhydrAMINE (BENADRYL ALLERGY) 25 MG tablet, Take 25 mg by mouth., Disp: , Rfl:    finasteride  (PROSCAR ) 5 MG tablet, Take 1 tablet (5 mg total) by mouth daily., Disp: 90 tablet, Rfl: 3   guaiFENesin  (MUCINEX ) 600 MG 12 hr tablet, Take 2 tablets (1,200 mg total) by mouth 2 (two) times daily as needed., Disp: , Rfl:    ipratropium (ATROVENT) 0.03 % nasal spray, Place 2 sprays into both nostrils 3 (three) times daily., Disp: , Rfl:    loperamide  (IMODIUM ) 2 MG capsule, Take 1 capsule (2 mg total) by mouth as needed for diarrhea or loose stools., Disp: 20 capsule, Rfl: 0   loratadine  (CLARITIN ) 10 MG tablet, Take 10 mg by mouth daily as needed., Disp: , Rfl:    metoprolol  tartrate (LOPRESSOR ) 25 MG tablet, Take 0.5 tablets (12.5 mg total) by mouth 2 (two)  times daily., Disp: 30 tablet, Rfl: 1   midodrine (PROAMATINE) 2.5 MG tablet, Take 2.5 mg by mouth 2 (two) times daily., Disp: , Rfl:    Multiple Vitamin (MULTI-VITAMINS) TABS, Take 1 tablet by mouth daily. , Disp: , Rfl:    mupirocin ointment (BACTROBAN) 2 %, Apply topically 3 (three) times daily., Disp: , Rfl:    pantoprazole  (PROTONIX ) 40 MG tablet, Take 40 mg by mouth daily., Disp: , Rfl:    sertraline  (ZOLOFT ) 50 MG tablet, Take 1 tablet by mouth daily., Disp: , Rfl:    simvastatin  (ZOCOR ) 40 MG tablet, Take 40 mg by mouth daily at 6 PM. , Disp: , Rfl:    tamsulosin  (FLOMAX ) 0.4 MG CAPS capsule, Take 1 capsule (0.4 mg total) by mouth daily., Disp: 90 capsule, Rfl: 3   torsemide  (DEMADEX ) 20 MG tablet, Take 1 tablet (20 mg total) by mouth daily., Disp: 30 tablet, Rfl: 0   ferrous sulfate  325 (65 FE) MG tablet, Take 1 tablet (  325 mg total) by mouth daily. (Patient not taking: Reported on 02/27/2024), Disp: 30 tablet, Rfl: 0   Omega-3 Fatty Acids (FISH OIL) 1000 MG CAPS, Take 1 capsule by mouth daily. (Patient not taking: Reported on 02/27/2024), Disp: , Rfl:   "

## 2024-05-16 ENCOUNTER — Inpatient Hospital Stay

## 2024-07-18 ENCOUNTER — Inpatient Hospital Stay

## 2024-09-16 ENCOUNTER — Inpatient Hospital Stay

## 2024-09-16 ENCOUNTER — Inpatient Hospital Stay: Admitting: Oncology
